# Patient Record
Sex: Female | Born: 1935
Health system: Southern US, Community
[De-identification: ages and names within clinical notes are randomized; demographics above are authoritative.]

## PROBLEM LIST (undated history)

## (undated) DIAGNOSIS — I4891 Unspecified atrial fibrillation: Secondary | ICD-10-CM

## (undated) DIAGNOSIS — J189 Pneumonia, unspecified organism: Secondary | ICD-10-CM

## (undated) DIAGNOSIS — K631 Perforation of intestine (nontraumatic): Secondary | ICD-10-CM

## (undated) DIAGNOSIS — R079 Chest pain, unspecified: Secondary | ICD-10-CM

## (undated) DIAGNOSIS — K5720 Diverticulitis of large intestine with perforation and abscess without bleeding: Secondary | ICD-10-CM

## (undated) DIAGNOSIS — E871 Hypo-osmolality and hyponatremia: Secondary | ICD-10-CM

## (undated) DIAGNOSIS — R042 Hemoptysis: Secondary | ICD-10-CM

## (undated) DIAGNOSIS — S72001A Fracture of unspecified part of neck of right femur, initial encounter for closed fracture: Secondary | ICD-10-CM

## (undated) DIAGNOSIS — K56609 Unspecified intestinal obstruction, unspecified as to partial versus complete obstruction: Secondary | ICD-10-CM

## (undated) DIAGNOSIS — E876 Hypokalemia: Secondary | ICD-10-CM

## (undated) DIAGNOSIS — R1013 Epigastric pain: Secondary | ICD-10-CM

## (undated) DIAGNOSIS — R350 Frequency of micturition: Secondary | ICD-10-CM

## (undated) DIAGNOSIS — L84 Corns and callosities: Secondary | ICD-10-CM

## (undated) DIAGNOSIS — E039 Hypothyroidism, unspecified: Secondary | ICD-10-CM

## (undated) DIAGNOSIS — F329 Major depressive disorder, single episode, unspecified: Secondary | ICD-10-CM

## (undated) DIAGNOSIS — I10 Essential (primary) hypertension: Secondary | ICD-10-CM

## (undated) DIAGNOSIS — K219 Gastro-esophageal reflux disease without esophagitis: Secondary | ICD-10-CM

## (undated) DIAGNOSIS — S72009A Fracture of unspecified part of neck of unspecified femur, initial encounter for closed fracture: Secondary | ICD-10-CM

## (undated) DIAGNOSIS — R001 Bradycardia, unspecified: Secondary | ICD-10-CM

## (undated) DIAGNOSIS — I498 Other specified cardiac arrhythmias: Secondary | ICD-10-CM

## (undated) DIAGNOSIS — E860 Dehydration: Secondary | ICD-10-CM

## (undated) DIAGNOSIS — E43 Unspecified severe protein-calorie malnutrition: Secondary | ICD-10-CM

## (undated) HISTORY — DX: Unspecified atrial fibrillation: I48.91

## (undated) HISTORY — DX: Bradycardia, unspecified: R00.1

## (undated) HISTORY — DX: Hypokalemia: E87.6

## (undated) HISTORY — DX: Frequency of micturition: R35.0

## (undated) HISTORY — DX: Epigastric pain: R10.13

## (undated) HISTORY — DX: Hemoptysis: R04.2

## (undated) HISTORY — DX: Diverticulitis of large intestine with perforation and abscess without bleeding: K57.20

## (undated) HISTORY — DX: Corns and callosities: L84

## (undated) HISTORY — PX: OTHER SURGICAL HISTORY: SHX169

## (undated) HISTORY — DX: Essential (primary) hypertension: I10

---
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
## 1998-11-04 ENCOUNTER — Encounter: Admission: RE | Admit: 1998-11-04 | Discharge: 1998-11-04 | Payer: Self-pay | Admitting: *Deleted

## 1998-11-04 ENCOUNTER — Encounter: Payer: Self-pay | Admitting: *Deleted

## 2005-07-26 ENCOUNTER — Inpatient Hospital Stay (HOSPITAL_COMMUNITY): Admission: EM | Admit: 2005-07-26 | Discharge: 2005-07-30 | Payer: Self-pay | Admitting: Emergency Medicine

## 2005-07-26 ENCOUNTER — Ambulatory Visit: Payer: Self-pay | Admitting: Internal Medicine

## 2005-07-27 ENCOUNTER — Encounter (INDEPENDENT_AMBULATORY_CARE_PROVIDER_SITE_OTHER): Payer: Self-pay | Admitting: Cardiology

## 2005-08-02 ENCOUNTER — Ambulatory Visit: Payer: Self-pay | Admitting: *Deleted

## 2005-08-02 ENCOUNTER — Ambulatory Visit: Payer: Self-pay | Admitting: Internal Medicine

## 2005-08-08 ENCOUNTER — Ambulatory Visit: Payer: Self-pay | Admitting: Cardiovascular Disease

## 2005-08-15 ENCOUNTER — Ambulatory Visit: Payer: Self-pay | Admitting: Internal Medicine

## 2005-08-22 ENCOUNTER — Ambulatory Visit: Payer: Self-pay | Admitting: Cardiology

## 2005-08-25 ENCOUNTER — Ambulatory Visit: Payer: Self-pay | Admitting: Internal Medicine

## 2005-08-29 ENCOUNTER — Ambulatory Visit: Payer: Self-pay | Admitting: Internal Medicine

## 2005-09-08 ENCOUNTER — Ambulatory Visit: Payer: Self-pay | Admitting: Cardiology

## 2005-09-16 ENCOUNTER — Ambulatory Visit: Payer: Self-pay | Admitting: Cardiology

## 2005-09-26 ENCOUNTER — Ambulatory Visit: Payer: Self-pay | Admitting: Cardiology

## 2005-10-10 ENCOUNTER — Ambulatory Visit: Payer: Self-pay | Admitting: Cardiology

## 2005-11-04 ENCOUNTER — Ambulatory Visit: Payer: Self-pay | Admitting: Internal Medicine

## 2005-11-24 ENCOUNTER — Ambulatory Visit: Payer: Self-pay | Admitting: Internal Medicine

## 2005-11-28 ENCOUNTER — Ambulatory Visit: Payer: Self-pay | Admitting: Cardiology

## 2005-12-05 ENCOUNTER — Ambulatory Visit: Payer: Self-pay | Admitting: Cardiology

## 2005-12-28 ENCOUNTER — Ambulatory Visit: Payer: Self-pay | Admitting: *Deleted

## 2006-01-12 ENCOUNTER — Ambulatory Visit: Payer: Self-pay | Admitting: Cardiology

## 2006-01-27 ENCOUNTER — Ambulatory Visit: Payer: Self-pay | Admitting: Cardiovascular Disease

## 2006-02-10 ENCOUNTER — Ambulatory Visit: Payer: Self-pay | Admitting: Internal Medicine

## 2006-02-20 ENCOUNTER — Ambulatory Visit: Payer: Self-pay | Admitting: Cardiology

## 2006-03-06 ENCOUNTER — Ambulatory Visit: Payer: Self-pay | Admitting: Cardiology

## 2006-03-20 ENCOUNTER — Ambulatory Visit: Payer: Self-pay | Admitting: Cardiology

## 2006-03-21 ENCOUNTER — Ambulatory Visit: Payer: Self-pay | Admitting: Cardiovascular Disease

## 2006-03-22 ENCOUNTER — Ambulatory Visit: Payer: Self-pay

## 2006-03-22 ENCOUNTER — Ambulatory Visit: Payer: Self-pay | Admitting: Internal Medicine

## 2006-03-29 ENCOUNTER — Ambulatory Visit: Payer: Self-pay | Admitting: Cardiovascular Disease

## 2006-04-13 ENCOUNTER — Ambulatory Visit: Payer: Self-pay | Admitting: Cardiology

## 2006-05-11 ENCOUNTER — Ambulatory Visit: Payer: Self-pay | Admitting: Cardiovascular Disease

## 2006-05-18 ENCOUNTER — Ambulatory Visit: Payer: Self-pay | Admitting: Internal Medicine

## 2006-06-06 ENCOUNTER — Ambulatory Visit: Payer: Self-pay | Admitting: Cardiology

## 2006-06-30 ENCOUNTER — Ambulatory Visit: Payer: Self-pay | Admitting: Cardiovascular Disease

## 2006-07-28 ENCOUNTER — Ambulatory Visit: Payer: Self-pay | Admitting: Internal Medicine

## 2006-08-25 ENCOUNTER — Ambulatory Visit: Payer: Self-pay | Admitting: Cardiology

## 2006-09-22 ENCOUNTER — Ambulatory Visit: Payer: Self-pay | Admitting: Cardiology

## 2006-09-22 ENCOUNTER — Ambulatory Visit: Payer: Self-pay | Admitting: Internal Medicine

## 2006-09-22 LAB — CONVERTED CEMR LAB
Basophils Relative: 1 % (ref 0.0–1.0)
Creatinine, Ser: 0.6 mg/dL (ref 0.4–1.2)
Eosinophils Relative: 3.3 % (ref 0.0–5.0)
GFR calc non Af Amer: 101 mL/min
Glucose, Bld: 85 mg/dL (ref 70–99)
Hemoglobin: 13.1 g/dL (ref 12.0–15.0)
INR: 2.4 — ABNORMAL HIGH (ref 0.8–1.0)
MCV: 91.9 fL (ref 78.0–100.0)
Monocytes Absolute: 0.5 10*3/uL (ref 0.2–0.7)
Monocytes Relative: 7.8 % (ref 3.0–11.0)
Neutro Abs: 4.5 10*3/uL (ref 1.4–7.7)
Platelets: 332 10*3/uL (ref 150–400)
Prothrombin Time: 19.3 s — ABNORMAL HIGH (ref 10.9–13.3)
RBC: 4.21 M/uL (ref 3.87–5.11)
WBC: 6.9 10*3/uL (ref 4.5–10.5)
aPTT: 35.1 s — ABNORMAL HIGH (ref 21.7–29.8)

## 2006-10-20 ENCOUNTER — Ambulatory Visit: Payer: Self-pay | Admitting: Cardiology

## 2006-11-17 ENCOUNTER — Ambulatory Visit: Payer: Self-pay | Admitting: Cardiology

## 2006-11-27 ENCOUNTER — Encounter: Admission: RE | Admit: 2006-11-27 | Discharge: 2006-11-27 | Payer: Self-pay | Admitting: Gastroenterology

## 2006-12-15 ENCOUNTER — Ambulatory Visit: Payer: Self-pay | Admitting: Cardiology

## 2006-12-28 ENCOUNTER — Ambulatory Visit: Payer: Self-pay | Admitting: Cardiology

## 2007-01-15 ENCOUNTER — Ambulatory Visit: Payer: Self-pay | Admitting: Cardiology

## 2007-02-12 ENCOUNTER — Ambulatory Visit: Payer: Self-pay | Admitting: Internal Medicine

## 2007-03-16 ENCOUNTER — Ambulatory Visit: Payer: Self-pay | Admitting: Cardiology

## 2007-04-05 ENCOUNTER — Ambulatory Visit: Payer: Self-pay | Admitting: Internal Medicine

## 2007-05-03 ENCOUNTER — Ambulatory Visit: Payer: Self-pay | Admitting: Internal Medicine

## 2007-06-05 ENCOUNTER — Ambulatory Visit: Payer: Self-pay | Admitting: Cardiology

## 2007-06-20 ENCOUNTER — Ambulatory Visit: Payer: Self-pay | Admitting: Cardiology

## 2007-06-28 ENCOUNTER — Ambulatory Visit: Payer: Self-pay | Admitting: Internal Medicine

## 2007-07-24 ENCOUNTER — Ambulatory Visit: Payer: Self-pay | Admitting: Internal Medicine

## 2007-07-25 ENCOUNTER — Ambulatory Visit: Payer: Self-pay | Admitting: Cardiology

## 2007-08-06 ENCOUNTER — Ambulatory Visit: Payer: Self-pay | Admitting: Cardiovascular Disease

## 2007-08-29 ENCOUNTER — Ambulatory Visit: Payer: Self-pay | Admitting: Cardiology

## 2007-09-19 ENCOUNTER — Ambulatory Visit: Payer: Self-pay | Admitting: Internal Medicine

## 2007-10-19 ENCOUNTER — Ambulatory Visit: Payer: Self-pay | Admitting: Cardiology

## 2007-11-16 ENCOUNTER — Ambulatory Visit: Payer: Self-pay | Admitting: Cardiology

## 2007-12-21 ENCOUNTER — Ambulatory Visit: Payer: Self-pay | Admitting: Cardiovascular Disease

## 2008-01-08 ENCOUNTER — Ambulatory Visit: Payer: Self-pay | Admitting: Cardiovascular Disease

## 2008-02-05 ENCOUNTER — Ambulatory Visit: Payer: Self-pay | Admitting: Cardiology

## 2008-04-19 DIAGNOSIS — I498 Other specified cardiac arrhythmias: Secondary | ICD-10-CM

## 2008-04-19 DIAGNOSIS — I10 Essential (primary) hypertension: Secondary | ICD-10-CM

## 2008-04-19 DIAGNOSIS — I4891 Unspecified atrial fibrillation: Secondary | ICD-10-CM | POA: Insufficient documentation

## 2008-04-19 HISTORY — DX: Other specified cardiac arrhythmias: I49.8

## 2008-04-19 HISTORY — DX: Essential (primary) hypertension: I10

## 2008-04-22 ENCOUNTER — Encounter: Payer: Self-pay | Admitting: Internal Medicine

## 2008-04-22 ENCOUNTER — Ambulatory Visit: Payer: Self-pay | Admitting: Internal Medicine

## 2008-04-22 DIAGNOSIS — R079 Chest pain, unspecified: Secondary | ICD-10-CM | POA: Insufficient documentation

## 2008-04-22 HISTORY — DX: Chest pain, unspecified: R07.9

## 2008-06-11 ENCOUNTER — Encounter: Payer: Self-pay | Admitting: *Deleted

## 2008-07-16 ENCOUNTER — Encounter: Payer: Self-pay | Admitting: *Deleted

## 2008-07-22 ENCOUNTER — Telehealth: Payer: Self-pay | Admitting: Internal Medicine

## 2009-04-17 ENCOUNTER — Ambulatory Visit: Payer: Self-pay | Admitting: Internal Medicine

## 2010-02-09 NOTE — Assessment & Plan Note (Signed)
Summary: 1 year return   Visit Type:  Follow-up- 1 year  CC:  No cardiac complaints..  History of Present Illness: [name] is seen in followup for atrial fibrillation atrial tachycardia and bradycardia. She also has hypertension.She has had no complaints of arrhythmias.The patient denies SOB, chest pain, edema or palpitations She is anticipating a return trip to [place] here  Current Medications (verified): 1)  Lisinopril-Hydrochlorothiazide 20-12.5 Mg Tabs (Lisinopril-Hydrochlorothiazide) .... Take Two Tabs By Mouth Once Daily 2)  Coumadin 2 Mg Tabs (Warfarin Sodium) .... As Directed 3)  Trazodone Hcl 50 Mg Tabs (Trazodone Hcl) .... At Bedtime 4)  Fish Oil 1000 Mg Caps (Omega-3 Fatty Acids) .... Take Two Tab By Mouth Once Daily 5)  Pindolol 5 Mg Tabs (Pindolol) .... 1/2 Tab Twice   Daily 6)  Amlodipine Besylate 5 Mg Tabs (Amlodipine Besylate) .Marland Kitchen.. 1 Tab Once Daily 7)  Nitrostat 0.4 Mg Subl (Nitroglycerin) .... As Needed 8)  Zantac 150 Mg Tabs (Ranitidine Hcl) .... Take One Tab By Mouth Two Times A Day 9)  Multivitamins   Tabs (Multiple Vitamin) .... Take One Tab By Mouth Once Daily 10)  Vitamin D 400 Unit  Tabs (Cholecalciferol) .... Take One Tab By Mouth Once Daily 11)  Vitamin B-12 500 Mcg  Tabs (Cyanocobalamin) .... Take One Tab By Mouth Every Other Day 12)  Mag-Ox 400 400 Mg Tabs (Magnesium Oxide) .... Take One Tab By Mouth Once Daily 13)  Calcium Carbonate-Vitamin D 600-400 Mg-Unit  Tabs (Calcium Carbonate-Vitamin D) .... Take Two Tabs By Mouth Once Daily  Allergies (verified): No Known Drug Allergies  Past History:  Past Medical History: Last updated: 04/19/2008 BRADYCARDIA (ICD-427.89) ALOPECIA (ICD-704.00) HYPERTENSION, UNSPECIFIED (ICD-401.9) ATRIAL FIBRILLATION (ICD-427.31)    Past Surgical History: Last updated: 04/19/2008 hysterectomy  Family History: Last updated: 04/19/2008 Family History of Cancer:  Family History of Coronary Artery Disease:   Social  History: Last updated: 04/19/2008 Retired  Married  Tobacco Use - Former. at age 75 Alcohol Use - yes occasional wine Drug Use - no  Vital Signs:  Patient profile:   77 year old female Height:      62 inches Weight:      129 pounds Pulse rate:   50 / minute Pulse rhythm:   regular BP sitting:   138 / 82  (left arm) Cuff size:   regular  Vitals Entered By: Judithe Modest CMA (April 17, 2009 9:49 AM)  Physical Exam  General:  Well developed, well nourished, in no acute distress. Head:  normal HEENT Neck:  supple with flat neck veins Lungs:  clear Heart:  regular rate and rhythm without murmurs or gallops Abdomen:  soft nontender with active bowel sounds Extremities:  without clubbing cyanosis or edema Neurologic:  alert and oriented with decreased hearing Skin:  warm and dry   EKG  Procedure date:  04/17/2009  Findings:      sinus rhythm at 50 Intervals 0.22/0.09/0.47 Axis is 35 Minor first degree AV block frequent PAC  Impression & Recommendations:  Problem # 1:  ALOPECIA (ICD-704.00) Alopecia resolved with a change in medications  Problem # 2:  HYPERTENSION, UNSPECIFIED (ICD-401.9)  well-controlled on current medicine Her updated medication list for this problem includes:    Lisinopril-hydrochlorothiazide 20-12.5 Mg Tabs (Lisinopril-hydrochlorothiazide) .Marland Kitchen... Take two tabs by mouth once daily    Pindolol 5 Mg Tabs (Pindolol) .Marland Kitchen... 1/2 tab twice   daily    Amlodipine Besylate 5 Mg Tabs (Amlodipine besylate) .Marland Kitchen... 1 tab once daily  Orders: EKG w/ Interpretation (93000)  Problem # 3:  ATRIAL FIBRILLATION (ICD-427.31) no recurrent atrial fibrillation; still on Coumadin  Orders: EKG w/ Interpretation (93000)  Problem # 4:  BRADYCARDIA (ICD-427.89) asymptomatic  Orders: EKG w/ Interpretation (93000)  Patient Instructions: 1)  Your physician wants you to follow-up in: 1 year.  You will receive a reminder letter in the mail two months in advance. If  you don't receive a letter, please call our office to schedule the follow-up appointment.

## 2010-05-25 NOTE — Assessment & Plan Note (Signed)
[facility]                         ELECTROPHYSIOLOGY OFFICE NOTE   NADEAN, MONTANARO                         MRN:          [id number]  DATE:07/24/2007                            DOB:          1920/07/18    Mrs. Gunkel is seen in followup for atrial fibrillation.  When we saw  her a month ago, she was complaining of alopecia and I thought may be it  was related to her pindolol.  We elected at that point to discontinue  this pindolol and she thinks her alopecia may be somewhat better.   OTHER MEDICATIONS CURRENTLY:  1. Lisinopril HCT 20/25.  2. Trazodone.  3. Magnesium.   PHYSICAL EXAMINATION:  VITAL SIGNS:  Her blood pressure remains mildly  elevated at 158/86 and her pulse is 58.  LUNGS:  Clear.  HEART:  Sounds were regular.  EXTREMITIES:  Without edema.   IMPRESSION:  1. Paroxysmal atrial fibrillation.  2. Hypertension.  3. Relative bradycardia.  4. Alopecia.  5. Thromboembolic risk factors including hypertension, age, on      Coumadin.   Mrs. Mortellaro is doing I think better off the pindolol.  We will plan to  give her about 8 more weeks to see if she can tell whether the alopecia  is resolving.   We also chose to defer at this point Augmentin, antihypertensive therapy  so as to minimize the likelihood of confusion between the side effects  and drugs.   We will make a decision in about 8 weeks.     Duke Salvia, MD, [facility]  Electronically Signed    SCK/MedQ  DD: 07/24/2007  DT: 07/25/2007  Job #: [id number]   cc:   C. Duane Lope, M.D.

## 2010-05-25 NOTE — Assessment & Plan Note (Signed)
[facility]                         ELECTROPHYSIOLOGY OFFICE NOTE   Candace Cain, Candace Cain                         MRN:          [id number]  DATE:09/19/2007                            DOB:          May 12, 1920    Candace Cain is seen in followup for alopecia in the setting of atrial  fibrillation for which she was taking pindolol pill.  We discontinued  pindolol about 3 months ago.  She has had now recurrent problems.  As  the beta-blocker has been withdrawn, she has noted her blood pressure  has been a little bit higher in the 145/85-90 range at home.  She is  concerned about her blood pressure.  She is feeling no tachy  palpitations at this point.   She does take Coumadin for her paroxysmal atrial arrhythmias and  lisinopril 20/12.5.   PHYSICAL EXAMINATION:  On examination today, her blood pressure was  153/78.  Her pulse was 57.  Her lungs were clear.  Heart sounds were  regular. Extremities were without edema.  The skin was warm and dry.   IMPRESSION:  1. Hypertension.  2. Paroxysmal atrial fibrillation.  3. Alopecia not seemingly related to pindolol as it did not improve      with discontinuation.   She is to follow up with Candace Cain in a couple of weeks.  At that time,  the rest of her medications list can be reviewed to see if there are any  contributors to alopecia.   I have given her a prescription for Norvasc 2.5 mg to take once a day,  beginning about 2 weeks before she sees Candace Cain.  That will give him a  chance to see:  a.  Whether it is effective.  b. Whether there are any associated side effects.     Candace Salvia, MD, [facility]  Electronically Signed    SCK/MedQ  DD: 09/19/2007  DT: 09/20/2007  Job #: [id number]   cc:   Candace Cain, M.D.

## 2010-05-25 NOTE — Assessment & Plan Note (Signed)
[facility]                         ELECTROPHYSIOLOGY OFFICE NOTE   Candace Cain, Candace Cain                         MRN:          [id number]  DATE:06/28/2007                            DOB:          April 03, 1920    Candace Cain comes in with her husband today, feeling terrific.  They  would like to stop the Coumadin.  She also has complained of a little  bit of alopecia.   MEDICATIONS:  1. Pindolol 5 mg twice a day.  2. Trazodone.  3. Lisinopril 20/25.  4. Coumadin.  5. Prilosec.  6. Fish oil.   PHYSICAL EXAMINATION:  VITAL SIGNS:  Her blood pressure was 120/64, her  pulse was 56, and her weight was 131.  LUNGS:  Clear.  HEART:  Her heart sounds were regular without murmurs or gallops.  EXTREMITIES:  Without edema.   Today, electrocardiogram demonstrated sinus rhythm with PACs, with  intervals of 0.19/0.09/0.44.  The axis was 20 degrees.   IMPRESSION:  1. Paroxysmal atrial fibrillation.  2. Thromboembolic risk factors notable for:      a.     Hypertension.      b.     Age.  3. Relative bradycardia.   PLAN:  Candace Cain would like to come off her Coumadin.  We have had a  lengthy discussion about her CHAS score, thromboembolic risk potential,  and it was my recommendation that she maintain her Coumadin.  After  these discussions, the family was agreeable.   As related to her alopecia, we decided to stop her pindolol to see if  that helped.  We planned to stop that for a month.  She will let us  know, and then we will make a decision at that time as to what to do  about further medications.    Duke Salvia, MD, [facility]  Electronically Signed   SCK/MedQ  DD: 06/29/2007  DT: 06/30/2007  Job #: [id number]   cc:   C. Duane Lope, M.D.

## 2010-05-25 NOTE — Assessment & Plan Note (Signed)
[facility]                         ELECTROPHYSIOLOGY OFFICE NOTE   ARIBELLE, MCCOSH                         MRN:          [id number]  DATE:09/22/2006                            DOB:          Mar 28, 1920    Ms. Sauber comes in today.  She was awakened the other night with  severe chest pain.  She took two nitroglycerin with resolution.  Her  husband took her blood pressure  after that.  It was 83.   She has a history of atrial arrhythmias and bradycardia.  She has been  managed with Pindolol.  She is concerned about her nocturnal bradycardia  and so we stopped her nocturnal Pindolol the other day.   As it relates to her chest pain, she had a Myoview scan a number of  months ago that was negative; however, she lives in dread fear of this  chest pain.  She is concerned that she is going to die and that it is  related to her heart.   Her other medications include Pindolol, Coumadin, lisinopril/HCT 20/25  mg.   On examination her blood pressure today was 115/80, her pulse was 51.  LUNGS:  Clear.  Her heart sounds were regular with a widely split S1 and S2.  ABDOMEN:  Soft with active bowel sounds without midline pulsation or  epigastric discomfort.  EXTREMITIES:  No edema.   Electrocardiogram dated today demonstrated sinus rhythm at 52 with  intervals of 0.21/0.08/0.42.  There was an isolated PAC.   IMPRESSION:  1. Chest pain concerning the family for angina with a negative Myoview      scan.  2. Hypertension.  3. Atrial arrhythmias/fibrillation, on Coumadin.  4. Propensity toward bradycardia on Pindolol with increasing nocturnal      bradycardia.   Ms. Barsanti and her husband are both quite concerned that this chest  pain is cardiac in origin.  I suspect that that is not true.  We  discussed the potential benefits of catheterization for certainty given  her negative Myoview scan previously.  They would like to pursue this.  We have  discussed potential benefits as well as potential risks  including but not limited to stroke, heart attack and dying.  They  understand.   We will also plan to put her on a PPI and I have asked them not to give  her any more nitroglycerin given the hypotension response until we have  greater clarity as to whether she has coronary artery disease or not.     Duke Salvia, MD, [facility]  Electronically Signed    SCK/MedQ  DD: 09/22/2006  DT: 09/23/2006  Job #: [id number]

## 2010-05-25 NOTE — Assessment & Plan Note (Signed)
[facility]                         ELECTROPHYSIOLOGY OFFICE NOTE   Candace Cain, Candace Cain                         MRN:          [id number]  DATE:05/18/2006                            DOB:          [birth year]    Candace Cain comes in today with her husband.  She has atrial arrhythmias  and some bradycardia, but she has no complaints.  She has no chest pain  or shortness of breath.  She was actually doing very well and worked in  her flower garden for 4 hours the other day.   MEDICATIONS:  1. Lisinopril 20/12.5.  2. Pindolol 2.5 b.i.d.  3. Coumadin.  4. Vitamins.   PHYSICAL EXAMINATION:  VITAL SIGNS:  Her blood pressure is 136/84, pulse  of 51.  LUNGS:  Clear.  HEART:  Heart sounds were regular.  EXTREMITIES:  Without edema.   IMPRESSION:  1. Paroxysmal atrial arrhythmias including fibrillation and slow      tachycardia.  2. Modest bradycardia.  3. Systolic hypertension.   Candace Cain is stable.  Will see her again in 6 months.     Duke Salvia, MD, [facility]  Electronically Signed    SCK/MedQ  DD: 05/18/2006  DT: 05/18/2006  Job #: [id number]   cc:   C. Duane Lope, M.D.

## 2010-05-28 NOTE — Discharge Summary (Signed)
NAMEVIRGINIE, Candace Cain NO.:  [id number]   MEDICAL RECORD NO.:  [id number]          PATIENT TYPE:  INP   LOCATION:  2030                         FACILITY:  [facility]   PHYSICIAN:  Francisca December, M.D.  DATE OF BIRTH:  12/18/1920   DATE OF ADMISSION:  07/26/2005  DATE OF DISCHARGE:  07/30/2005                                 DISCHARGE SUMMARY   ADMISSION DIAGNOSIS:  Palpitations.   DISCHARGE DIAGNOSES:  1. Palpitations secondary to sinus tachycardia and paroxysmal      supraventricular tachycardia was intermittent atrial fibrillation.  2. Tachybrady syndrome with episodes of atrial tachycardia, then with      episodes of sinus bradycardia.  3. Prolonged QTC secondary to flecainide.  4. Systemic anticoagulation on Coumadin therapy.  5. Hypertension, controlled.  6. Pleuritic chest pain, resolved.  7. Dyspnea, resolved.  8. Status post total abdominal hysterectomy with  bilateral salpingo-      oophorectomy.  9. Hypokalemia, repleted.  10.Pulsatile abdominal mass negative for abdominal aortic aneurysm via      abdominal ultrasound.  11.Normal left ventricular systolic function was ejection fraction 55-65%      via 2-D echocardiogram July 27, 2005.  12.Mild mitral valvular regurgitation.  13.Normal stress Cardiolite without inducible ischemia with ejection      fraction of 69% July 28, 2005.  14.Chronic obstructive pulmonary disease.  15.Remote history of tobacco abuse with cessation at age 48.   PROCEDURES:  1. Abdominal ultrasound July 27, 2005.  2. Transthoracic 2-D echocardiogram July 27, 2005.  3. Stress Cardiolite test August 07, 2005.   HOSPITAL COURSE:  Candace Cain is an 77 year old female with a history of  hypertension who was admitted to the [facility] for new  onset palpitations that were characterized on telemetry by a sinus  bradycardia alternating with normal sinus rhythm, then alternating with  atrial fibrillation.  The  patient was placed on subcutaneous Lovenox  secondary to the atrial arrhythmia; however, she was not placed on IV  Cardizem or beta-blocker secondary to her bradycardia.  A 2-D echocardiogram  was obtained.  Normal LV systolic function with an EF of 55-65% with no  regional wall motion abnormalities.  There was mild mitral valvular  regurgitation.  D-dimer and TSH as well as BNP were normal.  The patient's  Verapamil was placed on hold secondary to bradycardia, however, her ACE  inhibitor was continued.  A cardiology consult was obtained.  A 12-lead EKG  was obtained and revealed sinus rhythm with occasional PAC with a  ventricular rate of 62 beats per minute with no ischemic changes.  Point of  care enzymes were obtained and were negative x2.  Serial cardiac enzymes  were obtained x3 with a peak troponin of 0.02.  A pulsatile abdominal mass  was felt and was ruled out for AAA via abdominal ultrasound.  The patient  was started on atenolol with blood pressure and heart rate parameters and  was monitored for tachybrady syndrome.  During the night, the patient had  episodes of sinus bradycardia without pauses, mainly while she  was asleep.  During the following day, the beta blocker was discontinued.  The patient's  complaints of chest pain had resolved and MI had been ruled out with  negative enzymes as well as a negative EKG.  The patient was scheduled for a  stress Cardiolite which revealed no evidence of stress-induced ischemia with  mild fixed thinning at the apex with a EF of 69%.  The patient continued to  have atrial tachycardia with episodes of sinus bradycardia.  As a result,  she was started on digoxin 0.25 mg daily as well as flecainide 50 mg daily  with a repeat EKG the following day.  The EKG revealed sinus bradycardia  with intermittent sinus tachycardia and PSVT with a prolonged QTC of 521  milliseconds.  The patient was having tachybrady syndrome with episodes of  increased  heart rate up to 105 beats per minute that was consistent with  atrial tachycardia then having episodes of bradycardia with pauses.  The QTC  was prolonged secondary to flecainide, so the flecainide was placed on hold  as well as the digoxin.  An EP consult was obtained.  Options of a beta  blocker, flecainide therapy, Verapamil or permanent pacemaker if the  previous therapies failed were discussed with the patient and her husband.  The patient was then started on Coumadin.  Her lisinopril was continued.  Pindolol was started.  Other medications were then discontinued and then the  patient was scheduled for an outpatient Holter monitor for 72 hours or more  to assess bradycardia.  During this admission, the patient became  hypokalemic and it was repleted with K-Dur 40 mEq.  The patient was  discharged to home on July 30, 2005, in stable condition and was followed up  in Dr. Odessa Fleming office to be fitted for the outpatient Holter monitor for  assessment of bradycardia.   LABORATORY DATA:  White blood count 5.4, hemoglobin 13.5, hematocrit 40.6,  platelets 314.  Sodium 135, potassium 3.3 which was repleted prior to her  discharge, chloride 103, CO2 26, glucose 105, BUN 13, creatinine 0.7,  calcium 9.  PT 12.7, INR 0.9.  D-dimer 0.22.  BNP 142.  TSH 2.128, T4 7.  Point of care cardiac markers:  Myoglobin 68.4 and 56.9, respectively; CK-MB  2 and 1.6, respectively; troponin I less than 0.05 x2.  Serial cardiac  markers x3: CK total 112, 92, and 89, respectively; CK-MB 2.4, 2.2, and 2,  respectively; troponin I 0.02, less than 0.01, and 0.02, respectively.  UA  negative.   EKG:  1. EKG July 26, 2005, as stated in the HPI.  2. EKG July 26, 2005, sinus bradycardia with paroxysmal atrial tachycardia      with a ventricular rate of 61 beats per minute.  No ischemic changes.      This is new compared with the previous EKG.  3. EKG July 28, 2005, sinus rhythm with paroxysmal atrial tachycardia  with     a ventricular rate of 72 beats per minute.  No ischemic changes.  4. EKG July 30, 2005, marked sinus bradycardia with occasional PACs with a      ventricular rate of 49 beats per minute.  No ischemic changes.   X-RAYS:  Chest x-ray July 28, 2005, COPD.  No acute findings.   CONDITION ON DISCHARGE:  Candace Cain was discharged to home on July 30, 2005, in stable condition without complaint of chest pain, palpitations,  shortness of breath, or dizziness (lightheadedness).  She  was discharged to  home with the plan to follow up with Dr. Anabel Halon to wear an outpatient  Holter monitor for 72+ hours for further evaluation of bradycardia.   DISCHARGE MEDICATIONS:  1. Pindolol 0.5 mg twice daily.  2. Coumadin 4 mg take as directed.  3. Lisinopril/hydrochlorothiazide 20/25 mg daily as prior to admission.  4. Ogen 1.25 mg daily as prior to admission.   DISCHARGE INSTRUCTIONS:  1. The patient was encouraged to continue to follow a heart-healthy diet      including low-salt.  2. The patient was instructed to call [phone] if questions were to arise      over the weekend.   FOLLOW-UP ARRANGEMENTS:  The patient was scheduled to follow up with Dr.  Berton Mount on August 01, 2005.  Dr. Odessa Fleming office was scheduled to call the  patient with an appointment time.  Dr. Odessa Fleming the office is [phone].  The  patient was scheduled to call that number if she was unable to make the  scheduled appointment.      Tylene Fantasia, [place]      Francisca December, M.D.  Electronically Signed    RDM/MEDQ  D:  10/05/2005  T:  10/07/2005  Job:  [id number]   cc:   C. Duane Lope, M.D.  Duke Salvia, MD, [facility]

## 2010-05-28 NOTE — Consult Note (Signed)
NAMEMARYCLARE, NYDAM NO.:  [id number]   MEDICAL RECORD NO.:  [id number]          PATIENT TYPE:  INP   LOCATION:  2030                         FACILITY:  [facility]   PHYSICIAN:  Francisca December, M.D.  DATE OF BIRTH:  02/22/1920   DATE OF CONSULTATION:  07/26/2005  DATE OF DISCHARGE:                                   CONSULTATION   REASON FOR CONSULTATION:  Palpitation.   HISTORY OF PRESENT ILLNESS:  Candace Cain is a pleasant 77 year old without  known prior cardiac disease except for mitral valve prolapse.  She had  been complaining of dyspnea for the past week and this has been associated  with palpitation over the last 3-4 days.  She did not have chest discomfort  until last night which lasted a few minutes.  It was an isolated event in  general.  Today, she took her own blood pressure and it ranged in the  systolic from 120 to 140 with a heart rate of 120.  This was associated with  some wooziness.  The patient states her heart rate is typically in the  50s.  EMS was called and she was transported to [facility]  Emergency Room.  There, she was found to be in normal sinus rhythm with  frequent PACs.  A 12-lead ECG did show the onset of a tachycardia of  approximate 100 toward the of the recording.  It did appear regular.   PAST HISTORY:  1.  Hypertension.  2.  Remote history of hysterectomy.   SOCIAL HISTORY:  Quit smoking 13 years ago.  Occasional wine.  No illicit  drugs.  She is married, accompanied by her husband here today in the  hospital.   FAMILY HISTORY:  Father died of coronary artery disease.  Mother has died,  had osteoporosis severe. One brother, 27, has had a myocardial infarction  and hypertension and he has passed away.   ALLERGIES:  None known.   CURRENT MEDICATIONS:  1.  Lisinopril/HCT 20/25 one p.o. daily.  2.  Ogen 1.25 mg daily.  3.  Verapamil 120 mg p.o. daily.  4.  Lovenox 1 mg/kg q.12h. has been begun here in the  hospital.   REVIEW OF SYSTEMS:  Negative otherwise.   PHYSICAL EXAMINATION:  VITAL SIGNS:  Blood pressure 135/88, pulse is 60 and regular.  While was  auscultating, the patient did go in and out of the tachycardia briefly,  respiratory rate 16, temperature 97.7, 98% saturation on 2 liters.  GENERAL:  This is a well-appearing 77 year old Caucasian woman who does  appear younger than stated age in no distress.  HEENT:  Unremarkable.  The head is atraumatic, normocephalic.  Pupils are  equal, round, react to light accommodation.  Extraocular motion intact.  The  sclerae are anicteric.  Oral mucosa pink and moist.  Teeth and gums in good  repair.  The tongue is not coated.  NECK:  Supple without thyromegaly or  masses.  The carotid upstrokes are normal.  There is no bruit.  There is no  jugular distension.  CHEST:  Clear  with adequate excursion bilaterally.  No wheezes, rales or  rhonchi.  HEART:  Has a regular rhythm and, in fact, the tachycardia auscultates  regular.  There is no murmur, click, gallop or rub.  ABDOMEN:  Soft and nontender.  There is a palpable pulsatile mass midline at  the umbilicus.  There is a vertical hysterectomy scar.  Bowel sounds present  in all quadrants.  There is no hepatomegaly.  EXTREMITIES:  Lower extremities show no edema.  She has intact distal  pulses.  There are no femoral bruits.  NEUROLOGICAL:  Cranial nerves 2-12 are intact.  Motor and sensory grossly  intact.  Gait not tested.  SKIN:  Warm, dry and clear.   ACCESSORY CLINICAL DATA:  Admission hemogram, serum electrolytes, BUN,  creatinine, glucose, and urinalysis all within normal limits.  Initial CK-MB  and troponin in the emergency room are normal.  TSH, D-dimer and BNP are  pending.  EKG shows sinus bradycardia and no specific findings other than  the PAC initiated tachycardia for 3 or beats.  On telemetry, she has  demonstrated paroxysmal atrial tachycardia.  This is relatively regular at   100 beats per minute and atrial activity is visible prior to each QRS.   ASSESSMENT:  1.  Paroxysmal atrial tachycardia, I doubt atrial fibrillation, possible      atrial flutter although flutter waves are not obvious.  2.  Associated light headedness and dizziness with above, question      tachybrady syndrome.  3.  Episode of chest discomfort.  4.  Hypertension.  5.  Abdominal pulsatile mass, rule out abdominal aortic aneurysm.   PLAN:  1.  Will continue telemetry monitoring, begin metoprolol 25 mg p.o. b.i.d.,      observe for possible tachybrady syndrome.  2.  Check 2-D echocardiogram, D-dimer, and BNP.  3.  Obtain cardiac serial cardiac enzymes.  Consider exercise Cardiolite.  4.  Check abdominal ultrasound.   Thank you very much for allowing me to assist in the care of Candace Cain.  It has been a pleasure to do so.  I will discuss her further care with you.      Francisca December, M.D.  Electronically Signed     JHE/MEDQ  D:  07/26/2005  T:  07/26/2005  Job:  [id number]   cc:   Miguel Aschoff, M.D.  Fax: [phone]

## 2010-05-28 NOTE — H&P (Signed)
NAMETYASIA, PACKARD NO.:  [id number]   MEDICAL RECORD NO.:  [id number]          PATIENT TYPE:  EMS   LOCATION:  MAJO                         FACILITY:  [facility]   PHYSICIAN:  Sherin Quarry, MD      DATE OF BIRTH:  06/06/1920   DATE OF ADMISSION:  07/26/2005  DATE OF DISCHARGE:                                HISTORY & PHYSICAL   Candace Cain is an 77 year old lady, who is generally in excellent health.  She is very physically active going to a fitness center on a daily basis and  walking on a treadmill or using an elliptical machine for approximately 45  to 60 minutes.  Normally this activity does not produce any problems with  shortness of breath; however, yesterday, the patient noted that she had a  feeling of palpitation in her chest while she was using the elliptical  machine.  She rested and then the problem seemed to go away, but later in  the evening and again this morning, she once again felt this sensation of  palpitations.  She also states that she felt dizzy and lightheaded while the  palpitations were occurring.  There was no associated headache, breathing  difficulty or coughing.  Eventually, her husband became concerned about  these symptoms and contacted EMS.  According to EMS report, when they  arrived, her heart rate was approximately 110 and during the transportation  process, they noted episodes irregular heartbeat which they felt were  consistent with atrial fibrillation.  There are no rhythm strips to support  this.  After arrival in the emergency department, her blood pressure was  156/81.  On monitoring of her heart rate, she is noted to have episodes  where her heart rate will be in the range of 45 to 50 with a sinus  mechanism.  Other periods where her heart rate will be about 70 to 80 with a  sinus mechanism and other periods where the heart rate will be 110 to 120  with what appeared to be episodes of atrial fibrillation.  During these  episodes of rapid heartbeat, she will describe palpitations.   Laboratory studies in the emergency room revealed a white count of 5800,  __________   The patient was admitted at this time for evaluation of irregular heartbeat  and associated palpitations symptoms __________   PAST MEDICAL HISTORY:  Current medications consist of Lisinopril/HCTZ 20/25  one daily, Ogen 1.25 mg daily and Verapamil 120 mg daily.   ALLERGIES:  NO KNOWN DRUG ALLERGIES.   OPERATIONS:  She has had a previous TAH/BSO.   MEDICAL ILLNESSES:  Her husband states that she has never been hospitalized  for any medical illness.  The only problem that she has been followed for as  an outpatient has been hypertension.  She states that Dr. Tenny Craw had advised  her that her blood pressure is more regulated.   FAMILY HISTORY:  Significant in that the patient's brother apparently died  of a myocardial infarction which occurred during sleep.  Another brother  also has heart problems and a history of angina.  Another brother died as a  result of prostate cancer.   SOCIAL HISTORY:  The patient smoked until age 11 when she discontinued this  practice.  She has not smoked for the last 13 years.  She does not use  alcohol or drugs.   REVIEW OF SYSTEMS:  HEAD:  She denies headache or dizziness.  EYES:  She  denies visual blurring or diplopia.  EAR/NOSE/THROAT:  Denies earache, sinus  pain or sore throat.  CHEST:  Denies coughing, wheezing or chest congestion.  CARDIOVASCULAR:  See above.  There is no recent history of orthopnea,  paroxysmal nocturnal dyspnea or ankle edema.  There is no history of  exertional chest pain.  GI:  There is no history of melena or hematochezia,  no nausea, vomiting or abdominal pain.  GU:  Denies dysuria or urinary  frequency.  NEURO:  No history of seizure or stroke.  ENDO:  Denies  excessive thirst, urinary frequency or nocturia.   PHYSICAL EXAMINATION:  GENERAL:  The patient is a very pleasant  lady, who is  quite alert and had no specific complaints except for the feeling of  palpitation when her heart beats fast.  HEENT:  Within normal limits.  CHEST:  Clear.  BACK:  No CVA or point tenderness.  CARDIOVASCULAR:  Normal S1 and S2. I did not hear any rubs.  I did not hear  any murmurs or gallops.  The heart rhythm is somewhat irregular again when  the heart rate is about 100 to 120.  ABDOMEN:  Benign.  Normal bowel sounds.  No masses or tenderness.  No  guarding or rebound.  NEUROLOGIC:  Examination of extremities normal.   IMPRESSION:  1.  Hert rhythm disturbance characterized by sinus bradycardia alternating      with normal sinus rhythm alternating with atrial fibrillation.      Apparently of acute onset.  2.  History of hypertension.  3.  Status post hysterectomy.   PLAN:  1.  Will admit the patient to telemetry.  2.  Will place her on subcutaneous Lovenox because of the atrial arrhythmia      may increase the risk of stroke.  It is somewhat problematic how best to      treat this arrhythmia because there is a significant degree of      underlying bradycardia which is going to make the use of IV Cardizem or      a Beta blocker somewhat difficult.  For this reason, I think the most      prudent thing to do is to get a cardiology consult.  I will also obtain      a 2D echo, thyroid study and D-dimer.  I am going to hold the patient's      Verapamil in light of the relative bradycardia and continue her Ace      inhibitor.           ______________________________  Sherin Quarry, MD     SY/MEDQ  D:  07/26/2005  T:  07/26/2005  Job:  [id number]   cc:   C. Duane Lope, M.D.  Fax: [phone]   [facility] Cardiology

## 2010-05-28 NOTE — Assessment & Plan Note (Signed)
[facility]                            CARDIOLOGY OFFICE NOTE   [name], [name]                         MRN:          [id number]  DATE:03/21/2006                            DOB:          May 06, 1920    HISTORY OF PRESENT ILLNESS:  Candace Cain was seen as an outpatient at  the [facility] on March 21, 2006.  She is an 77 year old  woman who is regularly followed by Dr. Graciela Husbands who presents for evaluation  of chest pain.   Candace Cain describes an episode that occurred approximately three days  ago where she developed left sided chest pressure radiating to the jaw.  The episode occurred at rest and lasted for approximately one minute.  She has had no further pain or pressure in her chest.  She denies any  dyspnea.  She is able to feel her heartbeat at times, but this episode  was distinct from her normal sensation of her heartbeat.  She denies  exertional symptoms.  There has been no recent lightheadedness or  syncope.  She does have ongoing palpitations.  She was evaluated  yesterday by her primary physician, Dr. Tenny Craw, who performed an EKG that  demonstrated what appears to be normal sinus rhythm versus an atrial  tachycardia that abruptly changes to marked sinus bradycardia for three  beats followed by a return to her sinus rhythm.  Of note, she has a  history of atrial tachycardia and sinus bradycardia.   CURRENT MEDICATIONS:  1. Pindolol 2.5 mg b.i.d.  2. Coumadin as directed.  3. Lisinopril HCT 20/25 mg daily.  4. Ogen 1.25 mg daily.  5. Multivitamin daily.  6. Vitamin C 500 mg daily.  7. Calcium Plus vitamin D 600 mg b.i.d.  8. Magnesium 400 mg two daily.   ALLERGIES:  NO KNOWN DRUG ALLERGIES.   PHYSICAL EXAMINATION:  GENERAL:  She is an alert and oriented elderly  woman, no acute distress.  VITAL SIGNS:  Weight 128 pounds, blood pressure 144/84, heart rate 59,  respirations 16.  HEENT:  Normal.  NECK:  Normal. Carotid  upstrokes without bruits.  Jugular venous  pressure normal.  LUNGS:  Clear to auscultation bilaterally.  HEART:  Demonstrates a variable rate with a normal rate followed by an  abrupt change to bradycardia and returned to normal rate of  approximately 90.  The apex is discrete and nondisplaced.  There is a  2/6 systolic ejection murmur over the left sternal border.  ABDOMEN:  Soft, nontender, no organomegaly.  EXTREMITIES:  No cyanosis, clubbing or edema.  Peripheral pulses are 2+  and equal throughout.   ASSESSMENT:  Mrs.  Cain is an 77 year old woman with the following  cardiac issues.  1. Chest pain.  She had a single episode of pain, but it certainly is      bothersome that she had left sided chest pressure radiating to the      jaw.  With her age as the main risk factor, I think we should rule      out significant ischemic heart disease and we  will schedule her for      an exercise Myoview stress study.  She was given a prescription for      sublingual nitroglycerin by Dr. Tenny Craw yesterday and the appropriate      use of this was again reviewed with her and her husband.  I also      asked her to begin an 81 mg aspirin until we have the results of      her stress test available.  2. Cardiac arrhythmias.  With the variation in her heart rate and      history of atrial tachycardia as well as significant bradycardia, I      have asked her to undergo a 48 hour Holter monitor.  We will be in      contact with her after the results are available.     Candace Cain. Excell Seltzer, MD  Electronically Signed    MDC/MedQ  DD: 03/21/2006  DT: 03/22/2006  Job #: [id number]   cc:   Duke Salvia, MD, [facility]  C. Duane Lope, M.D.

## 2010-05-28 NOTE — Assessment & Plan Note (Signed)
[facility]                              CARDIOLOGY OFFICE NOTE   Candace Cain, Candace Cain                         MRN:          [id number]  DATE:11/24/2005                            DOB:          [birth year]    REFERRING PHYSICIAN:  C. Duane Lope, M.D.   Mrs.  Cain comes in today with her husband.  She has atrial arrhythmias  and some bradycardia.  She has no complaints at this time.  She has no chest  pain or shortness of breath.   Her blood pressure has been doing pretty well.  It was a little elevated to  her concern today at 132/98, when I took it was 142/84.  The pulse was 80 to  100.   Her other medications in addition to Coumadin include:  1. Atenolol 2.5 b.i.d.  2. Lisinopril.  3. Hydrochlorothiazide 20/25 as well as Coumadin.   On examination her blood pressure was as noted.  Her lungs were clear.  Heart sounds were regular but they were rapid.  Extremities without edema.   Electrocardiogram was quite notable in that it showed an atrial tachycardia  running just under 100 beats per minute, at about 95 with termination of the  tachycardia and ensuing sinus rhythm at about 45 beats per minute.   IMPRESSION:  1. Paroxysmal atrial arrhythmias including fibrillation and a slow      tachycardia.  2. Tendency towards bradycardia.  3. Systolic hypertension.   This woman is stable from arrhythmia symptom point of  view.  I do not plan  to do anything different with her rhythm medications or consideration of  pacing at this time.   We will have her talk to the research foundation about consideration ROCKET  which is a research protocol looking at alternatives to Coumadin.   I will see her again in 6 months time.    Duke Salvia, MD, [facility]  Electronically Signed   SCK/MedQ  DD: 11/24/2005  DT: 11/24/2005  Job #: [id number]   cc:   C. Duane Lope, M.D.

## 2010-05-28 NOTE — Assessment & Plan Note (Signed)
[facility]                            CARDIOLOGY OFFICE NOTE   Candace Cain                         MRN:          [id number]  DATE:03/21/2006                            DOB:          07/01/1920    Candace Cain presents to the [facility] Cardiology office as an outpatient  on March 21, 2006.  She is an 77 year old woman with a history of atrial  arrhythmias and bradycardia who presents for evaluation of chest pain.  She had an episode approximately 3 days ago where she describes left-  sided chest pressure radiating up to the jaw.  This was a relatively  brief episode lasting for a matter of approximately 1 minute.  She has  had no further episodes since that time.  She denies any dyspnea,  orthopnea, or PND.  She has experienced nausea over the past 3 days on  an intermittent basis.  She was seen by Dr. Tenny Craw yesterday and cardiac  biomarkers were drawn, which were normal.  She had an EKG performed that  demonstrated sinus rhythm at a rate of approximately 90 with an abrupt  onset of marked sinus bradycardia for 4 beats, and then returned to  normal sinus rhythm.  There were no significant ST segment or T wave  abnormalities seen.   Candace Cain has also had a history of atrial tachycardia and  bradycardias, including atrial fibrillation, but she denies any recent  light-headedness, syncope, or palpitations.   CURRENT MEDICATIONS:  1. Pindolol 2.5 mg twice daily.  2. Coumadin as directed.  3. Lisinopril hydrochlorothiazide 20/25 mg daily.  4. Ogen 1.25 mg daily.  5. Multivitamin daily.  6. Vitamin C 500 mg daily.  7. Calcium plus D 600 mg twice daily.  8. Magnesium 400 mg 2 daily.   EXAM:  She is alert and oriented and in no acute distress.  Weight is 128 pounds, blood pressure 144/84, heart rate is 59.  HEENT:  Normal.  NECK:  Normal carotid upstrokes without bruits.  Jugular venous pressure  is normal.  LUNGS:  Clear to auscultation  bilaterally.  HEART:  Regular with a heart rate in the 90s followed by abrupt slowing  with a heart rate in the 40s to 50s.  There is a 2/6 ejection murmur on  the left sternal border.  There are no gallops.  ABDOMEN:  Soft and nontender.  No abdominal bruits.  No masses.  EXTREMITIES:  No cyanosis, clubbing, or edema.  Peripheral pulses are 2+  and equal throughout.   A 12-lead EKG demonstrates what is likely an atrial tachycardia based on  variable P wave morphologies with a heart rate of 99.  There are some  premature supraventricular beats present as well.  There are no  significant ST segment or T wave changes.   ASSESSMENT:  Candace Cain is an 77 year old woman with the following  cardiac issues.  1. Chest pain.  Although this was an isolated episode, she has had      ongoing nausea over the last few days and I am concerned about the  possibility of ischemic heart disease.  I think we should pursue an      exercise Myoview study.  Of note, the patient exercises regularly      and should be able to undergo the treadmill portion of an exercise      Myoview.  I asked her to start an 81 mg aspirin on a daily basis,      in addition to her Coumadin, until we sort out whether there is any      significant ischemic heart disease.  She was given a prescription      for sublingual nitroglycerin yesterday, and I have further      instructed her on out to use this.  2. Arrhythmia with alternating atrial tachycardia and sinus      bradycardia.  I think we should perform a 48 hour Holter monitor to      rule out any marked bradycardic episodes, as well as to evaluate      her rhythm and the presence of any significant tachycardia that we      may not have detected at this point.   I will follow up with Candace Cain after the results of her exercise  Myoview and 48 hour Holter monitor are available.     Veverly Fells. Excell Seltzer, MD  Electronically Signed    MDC/MedQ  DD: 03/21/2006  DT:  03/23/2006  Job #: [id number]   cc:   Duke Salvia, MD, [facility]  C. Duane Lope, M.D.

## 2010-05-28 NOTE — Assessment & Plan Note (Signed)
[facility]                           ELECTROPHYSIOLOGY OFFICE NOTE   Candace Cain, WAITES                         MRN:          [id number]  DATE:08/25/2005                            DOB:          October 09, 1920    [name] was seen following hospitalization  End of dictation.                                   Duke Salvia, MD, [facility]   SCK/MedQ  DD:  08/25/2005  DT:  08/26/2005  Job #:  [id number]

## 2010-05-28 NOTE — Letter (Signed)
August 25, 2005     C. Duane Lope, MD  [address]  [place], [place] [address]   RE:  LAVRA, IMLER  MRN:  [id number]  /  DOB:  06/16/1920   Dear Candace Cain:   Hope your summer has been fun.  Sundeep Dunstan was seen at the hospital in  consultation at the request of Carolanne Grumbling.  They have requested  subsequently that I follow her primarily and this was something that she  apparently has discussed with the other cardiology team.  I am glad to do  this if this is what you would like.   In any case, she was found to have atrial fibrillation at hospital.  She was  put on Coumadin.  She also had bradycardia and so we put her on pindolol  which has ISA, as you remember.   She is doing really well.  She has had much fewer palpitations, no  significant bradycardia.  She has a little bit of fatigue, which may be  related to the beta blocker (see below).   Otherwise, she is doing well.  Her current medications include  lisinopril/HCT, the pindolol 2.5 b.i.d., and 81 of aspirin.   On examination, her blood pressure is 128/62, pulse is 51.  Lungs were  clear.  Heart sounds were regular.  Extremities were without edema.   Holter monitor dated from July demonstrated runs of atrial tachycardia as  well as sinus rhythm with rates ranging from the 40s to the 130s, but she  was largely asymptomatic with all of this.   IMPRESSION:  1. Paroxysmal atrial fibrillation.  2. Relative bradycardia.  3. Hypertension.   With her thromboembolic risk factors of age and hypertension, Coumadin is  appropriate long-term therapy.  Aspirin is not an acceptable alternative.   We will plan to see her again in 3-4 months.   Let me know if there is anything I can do to help.    Sincerely,      Duke Salvia, MD, [facility]   SCK/MedQ  DD:  08/25/2005  DT:  08/25/2005  Job #:  [id number]

## 2010-06-04 ENCOUNTER — Emergency Department (HOSPITAL_COMMUNITY): Payer: Medicare Other

## 2010-06-04 ENCOUNTER — Observation Stay (HOSPITAL_COMMUNITY)
Admission: EM | Admit: 2010-06-04 | Discharge: 2010-06-05 | DRG: 313 | Disposition: A | Discharge status: Home / Self Care | Payer: Medicare Other | Attending: Internal Medicine | Admitting: Internal Medicine

## 2010-06-04 DIAGNOSIS — J449 Chronic obstructive pulmonary disease, unspecified: Secondary | ICD-10-CM | POA: Insufficient documentation

## 2010-06-04 DIAGNOSIS — R079 Chest pain, unspecified: Principal | ICD-10-CM | POA: Insufficient documentation

## 2010-06-04 DIAGNOSIS — Z79899 Other long term (current) drug therapy: Secondary | ICD-10-CM | POA: Insufficient documentation

## 2010-06-04 DIAGNOSIS — J4489 Other specified chronic obstructive pulmonary disease: Secondary | ICD-10-CM | POA: Insufficient documentation

## 2010-06-04 DIAGNOSIS — I4891 Unspecified atrial fibrillation: Secondary | ICD-10-CM | POA: Insufficient documentation

## 2010-06-04 DIAGNOSIS — I10 Essential (primary) hypertension: Secondary | ICD-10-CM | POA: Insufficient documentation

## 2010-06-04 DIAGNOSIS — Z7901 Long term (current) use of anticoagulants: Secondary | ICD-10-CM | POA: Insufficient documentation

## 2010-06-04 DIAGNOSIS — I498 Other specified cardiac arrhythmias: Secondary | ICD-10-CM | POA: Insufficient documentation

## 2010-06-04 LAB — CBC
HCT: 39.7 % (ref 36.0–46.0)
Hemoglobin: 13.2 g/dL (ref 12.0–15.0)
MCH: 30 pg (ref 26.0–34.0)
MCHC: 33.2 g/dL (ref 30.0–36.0)
Platelets: 279 10*3/uL (ref 150–400)
RDW: 13.4 % (ref 11.5–15.5)

## 2010-06-04 LAB — URINALYSIS, ROUTINE W REFLEX MICROSCOPIC
Bilirubin Urine: NEGATIVE
Hgb urine dipstick: NEGATIVE
Ketones, ur: 15 mg/dL — AB
Nitrite: NEGATIVE
Specific Gravity, Urine: 1.011 (ref 1.005–1.030)

## 2010-06-04 LAB — CK TOTAL AND CKMB (NOT AT ARMC)
CK, MB: 5.9 ng/mL — ABNORMAL HIGH (ref 0.3–4.0)
Relative Index: 2.1 (ref 0.0–2.5)
Total CK: 283 U/L — ABNORMAL HIGH (ref 7–177)

## 2010-06-04 LAB — PROTIME-INR
INR: 1.68 — ABNORMAL HIGH (ref 0.00–1.49)
Prothrombin Time: 20 seconds — ABNORMAL HIGH (ref 11.6–15.2)

## 2010-06-04 LAB — CARDIAC PANEL(CRET KIN+CKTOT+MB+TROPI)
CK, MB: 4.2 ng/mL — ABNORMAL HIGH (ref 0.3–4.0)
Total CK: 222 U/L — ABNORMAL HIGH (ref 7–177)

## 2010-06-04 LAB — DIFFERENTIAL
Basophils Relative: 1 % (ref 0–1)
Eosinophils Absolute: 0.1 10*3/uL (ref 0.0–0.7)
Eosinophils Relative: 1 % (ref 0–5)
Lymphocytes Relative: 16 % (ref 12–46)
Lymphs Abs: 1.1 10*3/uL (ref 0.7–4.0)
Neutro Abs: 5.4 10*3/uL (ref 1.7–7.7)

## 2010-06-04 LAB — BASIC METABOLIC PANEL
BUN: 20 mg/dL (ref 6–23)
CO2: 28 mEq/L (ref 19–32)
Calcium: 9.9 mg/dL (ref 8.4–10.5)
Chloride: 96 mEq/L (ref 96–112)
GFR calc Af Amer: >60 mL/min (ref >60)
Glucose, Bld: 101 mg/dL — ABNORMAL HIGH (ref 70–99)
Sodium: 133 mEq/L — ABNORMAL LOW (ref 135–145)

## 2010-06-05 LAB — BASIC METABOLIC PANEL
BUN: 19 mg/dL (ref 6–23)
CO2: 30 mEq/L (ref 19–32)
Calcium: 9.3 mg/dL (ref 8.4–10.5)
Creatinine, Ser: 0.66 mg/dL (ref 0.4–1.2)
GFR calc Af Amer: >60 mL/min (ref >60)
GFR calc non Af Amer: >60 mL/min (ref >60)
Potassium: 3.7 mEq/L (ref 3.5–5.1)

## 2010-06-05 LAB — LIPID PANEL
Cholesterol: 179 mg/dL (ref 0–200)
Total CHOL/HDL Ratio: 2 RATIO
Triglycerides: 82 mg/dL (ref <150)

## 2010-06-05 LAB — CBC
Hemoglobin: 12.2 g/dL (ref 12.0–15.0)
MCV: 90.1 fL (ref 78.0–100.0)
RBC: 4.05 MIL/uL (ref 3.87–5.11)

## 2010-06-05 LAB — PROTIME-INR: Prothrombin Time: 21.3 seconds — ABNORMAL HIGH (ref 11.6–15.2)

## 2010-06-05 LAB — CARDIAC PANEL(CRET KIN+CKTOT+MB+TROPI)
CK, MB: 3.5 ng/mL (ref 0.3–4.0)
Relative Index: 1.9 (ref 0.0–2.5)

## 2010-06-10 NOTE — Discharge Summary (Signed)
NAMEILEANE, SANDO NO.:  [id number]  MEDICAL RECORD NO.:  [id number]           PATIENT TYPE:  I  LOCATION:  3708                         FACILITY:  [facility]  PHYSICIAN:  Cassell Clement, M.D. DATE OF BIRTH:  13-Dec-1920  DATE OF ADMISSION:  06/04/2010 DATE OF DISCHARGE:  06/05/2010                              DISCHARGE SUMMARY   PRIMARY CARDIOLOGIST:  Duke Salvia, MD, [facility]  PRIMARY CARE PHYSICIAN:  Miguel Aschoff, MD  PROCEDURES PERFORMED DURING HOSPITALIZATION:  None.  FINAL DISCHARGE DIAGNOSES: 1. Chest pain ruled out for myocardial infarction. 2. History of atrial fibrillation on Coumadin. 3. Hypertension. 4. Chronic obstructive pulmonary disease. 5. Negative stress Myoview 2007. 6. Asymptomatic bradycardia.  HOSPITAL COURSE:  This is an 77 year old female with known history of CAD and paroxysmal atrial fibrillation who presented to the office for recheck of her INR.  She was discussing that she had been having a recent chest pain with the nurse that radiated to her jaw.  A discussion was had with on-call physician.  He decided to bring her to the emergency room for further evaluation.  The patient states that the pain occurred while working in her garden the day prior.  The patient states that the pain came on at rest while she was lying in bed, but not while she was walking on a treadmill.  She does walk on the treadmill an hour 5 times a week.  The patient had cardiac enzymes cycled, which were found to be negative.  The patient's EKG did not show any acute changes and was found to have bradycardia.  However, at a rate of 47 beats per minute.  The patient was monitored overnight with no further symptoms and was seen by Dr. Patty Sermons on further evaluation and found to be stable for discharge.  The patient will follow up with Dr. Graciela Husbands as an outpatient.  In the interim, the patient's amlodipine was increased from 5 mg daily to 10 mg daily, and she  was started on Protonix 40 mg daily.  She will continue to follow up in the [facility] as already scheduled and be seen by her primary care physician for continued medical management.  DISCHARGE LABORATORY DATA:  Troponin less than 0.30, less than 0.30, and less than 0.30 respectively, sodium 137, potassium 3.7, chloride 100, CO2 of 30, glucose 87, BUN 19, creatinine 0.66.  Hemoglobin 12.2, hematocrit 36.5, white blood cells 4.8, platelets 285.  INR 1.83 with a PT at 21.3.  RADIOLOGY:  Chest x-ray dated Jun 04, 2010, revealing stable exam.  No superimposed acute process noted.  DISCHARGE VITAL SIGNS:  Blood pressure 144/73, pulse 51, respirations 16, temperature 97.9, O2 sat 97% on room air.  EKG, sinus bradycardia with a rate of 47 beats per minute with first- degree A-V block with a PR interval of 0.218 milliseconds.  DISCHARGE MEDICATIONS: 1. Amlodipine 10 mg 1 p.o. daily (new higher dose with prescription     provided). 2. Protonix 40 mg 1 p.o. daily (new prescription provided). 3. Trazodone 50 mg 1 p.o. daily. 4. Warfarin 3 mg by mouth daily and to  be treated for PT/INR on     followup appointment. 5. Calcium carbonate 1 tablet by mouth daily. 6. Fish oil 1 tablet by mouth daily. 7. Lisinopril/hydrochlorothiazide 20/12.5 mg b.i.d. 8. Magnesium oxide 400 mg 2 tablets every evening. 9. Multivitamins 1 tablet daily. 10.Nitroglycerin 0.4 mg sublingual p.r.n. chest pain. 11.Probiotics 4 mg 1 tablet by mouth daily. 12.She is to stop ranitidine.  FOLLOWUP PLANS AND APPOINTMENTS: 1. The patient will follow up with Dr. Sherryl Manges.  Our office will     call to make the appointment. 2. The patient will follow up with Dr. Tenny Craw, primary care physician.     She is to call to make that appointment. 3. The patient has been advised on higher dose of Norvasc and     prescription for Protonix. 4. She is advised to bring all medications to followup appointment.  Time spent with  the patient to include physician time 35 minutes.     Bettey Mare. Lyman Bishop, NP   ______________________________ Cassell Clement, M.D.    KML/MEDQ  D:  06/05/2010  T:  06/06/2010  Job:  [id number]  cc:   Miguel Aschoff, M.D.  Electronically Signed by Joni Reining NP on 06/07/2010 08:59:17 PM Electronically Signed by Cassell Clement M.D. on 06/10/2010 12:48:11 PM

## 2010-06-11 ENCOUNTER — Encounter: Payer: Self-pay | Admitting: Internal Medicine

## 2010-06-29 ENCOUNTER — Ambulatory Visit (INDEPENDENT_AMBULATORY_CARE_PROVIDER_SITE_OTHER): Payer: Medicare Other | Admitting: Internal Medicine

## 2010-06-29 DIAGNOSIS — I10 Essential (primary) hypertension: Secondary | ICD-10-CM

## 2010-06-29 DIAGNOSIS — R079 Chest pain, unspecified: Secondary | ICD-10-CM

## 2010-06-29 DIAGNOSIS — I4891 Unspecified atrial fibrillation: Secondary | ICD-10-CM

## 2010-06-29 DIAGNOSIS — I498 Other specified cardiac arrhythmias: Secondary | ICD-10-CM

## 2010-06-29 NOTE — Assessment & Plan Note (Signed)
Well- controlled on current medication 

## 2010-06-29 NOTE — Assessment & Plan Note (Signed)
No known recurrent atrial fibrillation; she continues on warfarin

## 2010-06-29 NOTE — Assessment & Plan Note (Signed)
The patient was hospitalized last month for chest pain. Had some typical features, it is unrelated to exertion and she has continued to work out on her treadmill without symptoms since then. It has resolved with PPI therapy. We will continue to follow it without further evaluation

## 2010-06-29 NOTE — Progress Notes (Signed)
  HPI  Candace Cain is a 77 y.o. female  seen in followup for atrial fibrillation atrial tachycardia and bradycardia. She also has hypertension.She has had no complaints of arrhythmias.The patient denies SOB, chest pain, edema or palpitations She is anticipating a return trip to [place] here  Past Medical History  Diagnosis Date  . Bradycardia   . Alopecia   . Unspecified essential hypertension   . Atrial fibrillation     Past Surgical History  Procedure Date  . Hysterectomy, unspecifed area     Current Outpatient Prescriptions  Medication Sig Dispense Refill  . amLODipine (NORVASC) 5 MG tablet Take 5 mg by mouth daily.        . Calcium Carbonate-Vitamin D (TH CALCIUM CARBONATE-VITAMIN D) 600-400 MG-UNIT per tablet Take 2 tablets by mouth daily.        . fish oil-omega-3 fatty acids 1000 MG capsule Take 2 g by mouth daily.        Marland Kitchen lisinopril-hydrochlorothiazide (PRINZIDE,ZESTORETIC) 20-12.5 MG per tablet Take 2 tablets by mouth daily.        . magnesium oxide (MAG-OX 400) 400 MG tablet Take 400 mg by mouth daily.        . Multiple Vitamin (MULTIVITAMIN) tablet Take 1 tablet by mouth daily.        . nitroGLYCERIN (NITROSTAT) 0.4 MG SL tablet Place 0.4 mg under the tongue every 5 (five) minutes as needed.        . pindolol (VISKEN) 5 MG tablet Take 2.5 mg by mouth 2 (two) times daily.        . ranitidine (ZANTAC) 150 MG capsule Take 150 mg by mouth 2 (two) times daily.        . traZODone (DESYREL) 50 MG tablet Take 50 mg by mouth at bedtime.        . vitamin B-12 (CYANOCOBALAMIN) 500 MCG tablet Take 500 mcg by mouth every other day.        . vitamin D, CHOLECALCIFEROL, 400 UNITS tablet Take 400 Units by mouth daily.        Marland Kitchen warfarin (COUMADIN) 2 MG tablet Take 2 mg by mouth daily. UAD          Not on File  Review of Systems negative except from HPI and PMH  Physical Exam Well developed and well nourished in no acute distress HENT normal E scleral and icterus clear Neck  Supple JVP flat; carotids brisk and full Clear to ausculation Regular rate and rhythm, no murmurs gallops or rub Soft with active bowel sounds No clubbing cyanosis and edema Alert and oriented, grossly normal motor and sensory function Skin Warm and Dry  ECG Sinus rhythm at 63 Intervals 0.20 5.09/24 3 Otherwise normal  Assessment and  Plan

## 2010-06-29 NOTE — Assessment & Plan Note (Signed)
Tolerating her pindolol good exercise tolerance

## 2010-08-02 NOTE — H&P (Signed)
Candace Cain, Candace NO.:  Cain  MEDICAL RECORD NO.:  [id number]           PATIENT TYPE:  I  LOCATION:  3708                         FACILITY:  [facility]  PHYSICIAN:  Hillis Range, MD       DATE OF BIRTH:  [birth year]  DATE OF ADMISSION:  06/04/2010 DATE OF DISCHARGE:                             HISTORY & PHYSICAL   PRIMARY CARDIOLOGIST:  Duke Salvia, MD, [facility]  PRIMARY CARE PROVIDER:  Miguel Aschoff, MD  CHIEF COMPLAINT:  Chest pain.  HISTORY OF PRESENT ILLNESS:  This is an 77 year old female without known coronary artery disease, a history of paroxysmal atrial fibrillation and hypertension who presented for a recheck INR today.  She was discussing with the nurse that she had had recent chest pain that radiated to her jaw.  They discussed with the physician and decided to bring the patient to the emergency department for further evaluation.  Upon evaluation, the patient states the pain started while she was working in her garden yesterday.  The patient states the pain resolved after 2 aspirin and rest.  The patient had no associated symptoms of shortness of breath, nausea, or vomiting.  This morning, the patient states she has felt somewhat weak with mild nausea, but no chest pain or jaw pain.  The patient states that the pain in her chest has increased over the last several months.  She ends up taking approximately 1-3 nitroglycerin every 1-2 weeks.  This pain comes on at rest, while she is lying in bed. Interestingly, the patient uses the treadmill for approximately 1 hour 5 times a week without pain in her chest or radiation to her jaw.  The patient denies any recent fevers or chills.  She states she does have a right-sided earache, but she had been on antibiotic therapy for several weeks ago.  Currently, the patient's EKG is without acute changes.  Her cardiac enzymes are negative x1.  Cardiology was asked to evaluate the patient for further  recommendations.  PAST MEDICAL HISTORY: 1. Asymptomatic bradycardia. 2. Paroxysmal atrial fibrillation, on Coumadin therapy.     a.     Pindolol discontinued in the past secondary to alopecia. 3. Hypertension. 4. COPD. 5. Negative stress Myoview in 2007. 6. Status post total hysterectomy.  SOCIAL HISTORY:  The patient lives in [place] with her husband.  She denies any tobacco or alcohol use.  FAMILY HISTORY:  Noncontributory for premature coronary artery disease. Her father did pass away from myocardial infarction, he was elderly. Her brother passed away at the age of 35 from myocardial infarction.  ALLERGIES:  HYCODAN.  MEDICATIONS: 1. Lisinopril/hydrochlorothiazide 20/12.5 mg twice daily. 2. Trazodone 50 mg daily. 3. Zantac 150 mg twice daily. 4. Nitroglycerin sublingual as needed. 5. Multivitamin daily. 6. Calcium 500 plus D 2 times a day. 7. Fish oil 1000 mg daily. 8. Amlodipine 5 mg daily. 9. Coumadin 2 mg daily. 10.Magnesium oxide 400 mg daily.  REVIEW OF SYSTEMS:  All pertinent positives and negatives as stated in HPI.  All other systems have been reviewed and are negative.  PHYSICAL EXAMINATION:  VITAL SIGNS:  Temperature 97.4, pulse 57, respirations 18, blood pressure 140/72, O2 saturation 99% on room air. GENERAL:  This is a well-developed, well-nourished elderly female, she is in no acute distress. HEENT:  Normal. NECK:  Supple without JVD. HEART:  Regular rate and rhythm with S1 and S2.  No murmur, rub, or gallop noted.  PMI is normal. LUNGS:  Clear to auscultation bilaterally without wheezes, rales, or rhonchi. ABDOMEN:  Soft, nontender, positive bowel sounds x4. EXTREMITIES:  No clubbing, cyanosis, or edema. MUSCULOSKELETAL:  No joint deformities or effusions. NEURO:  Alert and oriented x3, cranial nerves II through XII grossly intact.  Chest x-ray showing no acute cardiopulmonary disease process.  EKG showing normal sinus rhythm at a rate of 61  beats per minute.  Axis is normal.  Intervals are normal.  There are no acute ST-T wave changes.  LABORATORY DATA:  WBC is 7.2, hemoglobin 13.2, hematocrit 39.7, platelet 279.  Sodium 133, potassium 4, chloride 96, bicarb 28, BUN 20, creatinine 0.61, INR 1.8.  Cardiac enzymes negative x1.  ASSESSMENT AND PLAN:  This is an 77 year old female with history of hypertension, paroxysmal atrial fibrillation, and previous normal Myoview in 2007 who presents with chest pain.  The patient's chest pain has typical and atypical features.  At this time given the patient's age, we would recommend medical therapy.  We will currently increase the patient's amlodipine to 2 mg daily.  If the patient continues to have chest pain, we can consider Imdur.  We will also switch the patient's H2 blocker to a PPI for a questionable gastrointestinal etiology.  At this time, we will hold off on adding heparin unless cardiac enzymes become positive.  If cardiac markers remain negative then the patient can go in the morning with further followup as an outpatient.     Leonette Monarch, PA-C   ______________________________ Hillis Range, MD  NB/MEDQ  D:  06/04/2010  T:  06/05/2010  Job:  [id number]  cc:   Miguel Aschoff, M.D. Duke Salvia, MD, [facility]  Electronically Signed by Alen Blew P.A. on 07/04/2010 04:49:33 PM Electronically Signed by Hillis Range MD on 08/02/2010 09:58:53 AM

## 2011-04-21 ENCOUNTER — Ambulatory Visit (INDEPENDENT_AMBULATORY_CARE_PROVIDER_SITE_OTHER): Payer: Medicare Other | Admitting: *Deleted

## 2011-04-21 DIAGNOSIS — I4891 Unspecified atrial fibrillation: Secondary | ICD-10-CM

## 2011-04-21 DIAGNOSIS — Z7901 Long term (current) use of anticoagulants: Secondary | ICD-10-CM | POA: Insufficient documentation

## 2011-04-21 LAB — POCT INR: INR: 1.8

## 2011-05-05 ENCOUNTER — Ambulatory Visit (INDEPENDENT_AMBULATORY_CARE_PROVIDER_SITE_OTHER): Payer: Medicare Other | Admitting: Pharmacist

## 2011-05-05 DIAGNOSIS — Z7901 Long term (current) use of anticoagulants: Secondary | ICD-10-CM

## 2011-05-05 DIAGNOSIS — I4891 Unspecified atrial fibrillation: Secondary | ICD-10-CM

## 2011-05-05 LAB — POCT INR: INR: 2.8

## 2011-06-03 ENCOUNTER — Ambulatory Visit (INDEPENDENT_AMBULATORY_CARE_PROVIDER_SITE_OTHER): Payer: Medicare Other | Admitting: Pharmacist

## 2011-06-03 DIAGNOSIS — I4891 Unspecified atrial fibrillation: Secondary | ICD-10-CM

## 2011-06-03 DIAGNOSIS — Z7901 Long term (current) use of anticoagulants: Secondary | ICD-10-CM

## 2011-06-16 ENCOUNTER — Ambulatory Visit: Payer: Medicare Other | Admitting: Internal Medicine

## 2011-06-30 ENCOUNTER — Ambulatory Visit (INDEPENDENT_AMBULATORY_CARE_PROVIDER_SITE_OTHER): Payer: Medicare Other | Admitting: *Deleted

## 2011-06-30 DIAGNOSIS — Z7901 Long term (current) use of anticoagulants: Secondary | ICD-10-CM

## 2011-06-30 DIAGNOSIS — I4891 Unspecified atrial fibrillation: Secondary | ICD-10-CM

## 2011-06-30 LAB — POCT INR: INR: 2.4

## 2011-07-12 ENCOUNTER — Ambulatory Visit: Payer: Medicare Other | Admitting: Internal Medicine

## 2011-08-04 ENCOUNTER — Ambulatory Visit (INDEPENDENT_AMBULATORY_CARE_PROVIDER_SITE_OTHER): Payer: Medicare Other | Admitting: Pharmacist

## 2011-08-04 DIAGNOSIS — I4891 Unspecified atrial fibrillation: Secondary | ICD-10-CM

## 2011-08-04 DIAGNOSIS — Z7901 Long term (current) use of anticoagulants: Secondary | ICD-10-CM

## 2011-08-04 LAB — POCT INR: INR: 2.5

## 2011-09-07 ENCOUNTER — Encounter: Payer: Self-pay | Admitting: Internal Medicine

## 2011-09-07 ENCOUNTER — Ambulatory Visit (INDEPENDENT_AMBULATORY_CARE_PROVIDER_SITE_OTHER): Payer: Medicare Other | Admitting: *Deleted

## 2011-09-07 ENCOUNTER — Ambulatory Visit (INDEPENDENT_AMBULATORY_CARE_PROVIDER_SITE_OTHER): Payer: Medicare Other | Admitting: Internal Medicine

## 2011-09-07 VITALS — BP 132/72 | Ht 61.5 in | Wt 127.8 lb

## 2011-09-07 DIAGNOSIS — I498 Other specified cardiac arrhythmias: Secondary | ICD-10-CM

## 2011-09-07 DIAGNOSIS — I10 Essential (primary) hypertension: Secondary | ICD-10-CM

## 2011-09-07 DIAGNOSIS — I4891 Unspecified atrial fibrillation: Secondary | ICD-10-CM

## 2011-09-07 DIAGNOSIS — Z7901 Long term (current) use of anticoagulants: Secondary | ICD-10-CM

## 2011-09-07 LAB — BASIC METABOLIC PANEL
Calcium: 9.6 mg/dL (ref 8.4–10.5)
Creatinine, Ser: 0.7 mg/dL (ref 0.4–1.2)
GFR: 81.07 mL/min (ref >60.00)
Sodium: 137 mEq/L (ref 135–145)

## 2011-09-07 LAB — POCT INR: INR: 3.2

## 2011-09-07 NOTE — Assessment & Plan Note (Signed)
Continue current meds  Check BMET

## 2011-09-07 NOTE — Patient Instructions (Signed)
Your physician recommends that you have for lab work: bmp  Your physician wants you to follow-up in: 1 year with Dr. Graciela Husbands. You will receive a reminder letter in the mail two months in advance. If you don't receive a letter, please call our office to schedule the follow-up appointment.

## 2011-09-07 NOTE — Assessment & Plan Note (Signed)
Holding sinus  

## 2011-09-07 NOTE — Progress Notes (Signed)
  HPI  Candace Cain is a 78 y.o. female Seen in followup for atrial fibrillation bradycardia and hypertension. She is doing well currently without symptoms of chest pain shortness of breath or edema.  In the last year her husband has been diagnosed with Parkinson's and he is quite disabled. She finds great reprieve in her flower garden  Past Medical History  Diagnosis Date  . Bradycardia   . Alopecia   . Unspecified essential hypertension   . Atrial fibrillation     Past Surgical History  Procedure Date  . Hysterectomy, unspecifed area     Current Outpatient Prescriptions  Medication Sig Dispense Refill  . amLODipine (NORVASC) 5 MG tablet Take 5 mg by mouth daily.        . Calcium Carbonate-Vitamin D (TH CALCIUM CARBONATE-VITAMIN D) 600-400 MG-UNIT per tablet Take 2 tablets by mouth daily.        . fish oil-omega-3 fatty acids 1000 MG capsule Take 2 g by mouth daily.        Marland Kitchen lisinopril-hydrochlorothiazide (PRINZIDE,ZESTORETIC) 20-12.5 MG per tablet Take 2 tablets by mouth daily.        . Multiple Vitamin (MULTIVITAMIN) tablet Take 1 tablet by mouth daily.        . nitroGLYCERIN (NITROSTAT) 0.4 MG SL tablet Place 0.4 mg under the tongue every 5 (five) minutes as needed.        Marland Kitchen OMEPRAZOLE PO Take by mouth daily.        . traZODone (DESYREL) 50 MG tablet Take 50 mg by mouth at bedtime.        Marland Kitchen warfarin (COUMADIN) 2 MG tablet Take 2 mg by mouth daily. UAD          No Known Allergies  Review of Systems negative except from HPI and PMH  Physical Exam BP 132/72  Ht 5' 1.5" (1.562 m)  Wt 127 lb 12.8 oz (57.97 kg)  BMI 23.76 kg/m2  Well developed and nourished in no acute distress HENT normal Neck supple with JVP-flat Carotids brisk and full without bruits Clear Regular rate and rhythm, no murmurs or gallops Abd-soft with active BS without hepatomegaly No Clubbing cyanosis edema Skin-warm and dry A & Oriented  Grossly normal sensory and motor function    Assessment  and  Plan  ECG  .NSR .21.08.43

## 2011-09-07 NOTE — Assessment & Plan Note (Signed)
stable °

## 2011-10-05 ENCOUNTER — Ambulatory Visit (INDEPENDENT_AMBULATORY_CARE_PROVIDER_SITE_OTHER): Payer: Medicare Other | Admitting: *Deleted

## 2011-10-05 DIAGNOSIS — I4891 Unspecified atrial fibrillation: Secondary | ICD-10-CM

## 2011-10-05 DIAGNOSIS — Z7901 Long term (current) use of anticoagulants: Secondary | ICD-10-CM

## 2014-03-26 ENCOUNTER — Other Ambulatory Visit: Payer: Self-pay | Admitting: Family Medicine

## 2014-03-26 ENCOUNTER — Ambulatory Visit
Admission: RE | Admit: 2014-03-26 | Discharge: 2014-03-26 | Disposition: A | Discharge status: Home / Self Care | Payer: Medicare Other | Source: Ambulatory Visit | Attending: Family Medicine | Admitting: Family Medicine

## 2014-03-26 DIAGNOSIS — J181 Lobar pneumonia, unspecified organism: Principal | ICD-10-CM

## 2014-03-26 DIAGNOSIS — R05 Cough: Secondary | ICD-10-CM

## 2014-03-26 DIAGNOSIS — R0989 Other specified symptoms and signs involving the circulatory and respiratory systems: Secondary | ICD-10-CM

## 2014-03-26 DIAGNOSIS — R059 Cough, unspecified: Secondary | ICD-10-CM

## 2014-03-26 DIAGNOSIS — J189 Pneumonia, unspecified organism: Secondary | ICD-10-CM

## 2014-03-27 ENCOUNTER — Encounter (HOSPITAL_COMMUNITY): Payer: Self-pay

## 2014-03-27 ENCOUNTER — Emergency Department (HOSPITAL_COMMUNITY): Payer: Medicare Other

## 2014-03-27 ENCOUNTER — Inpatient Hospital Stay (HOSPITAL_COMMUNITY)
Admission: EM | Admit: 2014-03-27 | Discharge: 2014-04-01 | DRG: 194 | Disposition: A | Discharge status: Home Health | Payer: Medicare Other | Attending: Internal Medicine | Admitting: Internal Medicine

## 2014-03-27 DIAGNOSIS — J189 Pneumonia, unspecified organism: Secondary | ICD-10-CM | POA: Diagnosis not present

## 2014-03-27 DIAGNOSIS — F419 Anxiety disorder, unspecified: Secondary | ICD-10-CM | POA: Diagnosis present

## 2014-03-27 DIAGNOSIS — I482 Chronic atrial fibrillation: Secondary | ICD-10-CM | POA: Diagnosis not present

## 2014-03-27 DIAGNOSIS — F329 Major depressive disorder, single episode, unspecified: Secondary | ICD-10-CM | POA: Diagnosis present

## 2014-03-27 DIAGNOSIS — I480 Paroxysmal atrial fibrillation: Secondary | ICD-10-CM | POA: Diagnosis present

## 2014-03-27 DIAGNOSIS — K219 Gastro-esophageal reflux disease without esophagitis: Secondary | ICD-10-CM | POA: Diagnosis present

## 2014-03-27 DIAGNOSIS — I10 Essential (primary) hypertension: Secondary | ICD-10-CM | POA: Diagnosis present

## 2014-03-27 DIAGNOSIS — E871 Hypo-osmolality and hyponatremia: Secondary | ICD-10-CM | POA: Diagnosis present

## 2014-03-27 DIAGNOSIS — Z87891 Personal history of nicotine dependence: Secondary | ICD-10-CM | POA: Diagnosis not present

## 2014-03-27 DIAGNOSIS — Z8249 Family history of ischemic heart disease and other diseases of the circulatory system: Secondary | ICD-10-CM | POA: Diagnosis not present

## 2014-03-27 DIAGNOSIS — R52 Pain, unspecified: Secondary | ICD-10-CM

## 2014-03-27 DIAGNOSIS — Z79899 Other long term (current) drug therapy: Secondary | ICD-10-CM | POA: Diagnosis not present

## 2014-03-27 DIAGNOSIS — J181 Lobar pneumonia, unspecified organism: Secondary | ICD-10-CM | POA: Diagnosis not present

## 2014-03-27 DIAGNOSIS — F32A Depression, unspecified: Secondary | ICD-10-CM | POA: Diagnosis present

## 2014-03-27 DIAGNOSIS — E039 Hypothyroidism, unspecified: Secondary | ICD-10-CM | POA: Diagnosis present

## 2014-03-27 DIAGNOSIS — I4891 Unspecified atrial fibrillation: Secondary | ICD-10-CM | POA: Diagnosis present

## 2014-03-27 DIAGNOSIS — E860 Dehydration: Secondary | ICD-10-CM | POA: Diagnosis present

## 2014-03-27 DIAGNOSIS — Z7901 Long term (current) use of anticoagulants: Secondary | ICD-10-CM | POA: Diagnosis not present

## 2014-03-27 DIAGNOSIS — E870 Hyperosmolality and hypernatremia: Secondary | ICD-10-CM | POA: Diagnosis present

## 2014-03-27 DIAGNOSIS — R05 Cough: Secondary | ICD-10-CM | POA: Diagnosis not present

## 2014-03-27 DIAGNOSIS — Z66 Do not resuscitate: Secondary | ICD-10-CM | POA: Diagnosis present

## 2014-03-27 DIAGNOSIS — E875 Hyperkalemia: Secondary | ICD-10-CM | POA: Diagnosis present

## 2014-03-27 DIAGNOSIS — R042 Hemoptysis: Secondary | ICD-10-CM

## 2014-03-27 HISTORY — DX: Hypo-osmolality and hyponatremia: E87.1

## 2014-03-27 LAB — I-STAT CG4 LACTIC ACID, ED: Lactic Acid, Venous: 0.74 mmol/L (ref 0.5–2.0)

## 2014-03-27 LAB — BASIC METABOLIC PANEL
Anion gap: 9 (ref 5–15)
BUN: 17 mg/dL (ref 6–23)
CALCIUM: 9 mg/dL (ref 8.4–10.5)
CHLORIDE: 88 mmol/L — AB (ref 96–112)
CO2: 25 mmol/L (ref 19–32)
Creatinine, Ser: 0.62 mg/dL (ref 0.50–1.10)
GFR calc Af Amer: 89 mL/min — ABNORMAL LOW (ref >90)
GFR calc non Af Amer: 76 mL/min — ABNORMAL LOW (ref >90)
Glucose, Bld: 114 mg/dL — ABNORMAL HIGH (ref 70–99)
Potassium: 4.4 mmol/L (ref 3.5–5.1)
Sodium: 122 mmol/L — ABNORMAL LOW (ref 135–145)

## 2014-03-27 LAB — CBC
HCT: 38.4 % (ref 36.0–46.0)
Hemoglobin: 13.2 g/dL (ref 12.0–15.0)
MCH: 30.7 pg (ref 26.0–34.0)
MCHC: 34.4 g/dL (ref 30.0–36.0)
MCV: 89.3 fL (ref 78.0–100.0)
PLATELETS: 254 10*3/uL (ref 150–400)
RBC: 4.3 MIL/uL (ref 3.87–5.11)
RDW: 12.8 % (ref 11.5–15.5)
WBC: 8 10*3/uL (ref 4.0–10.5)

## 2014-03-27 MED ORDER — SODIUM CHLORIDE 0.9 % IV BOLUS (SEPSIS)
1000.0000 mL | Freq: Once | INTRAVENOUS | Status: AC
Start: 2014-03-27 — End: 2014-03-28
  Administered 2014-03-27: 1000 mL via INTRAVENOUS

## 2014-03-27 MED ORDER — CEFTRIAXONE SODIUM 1 G IJ SOLR
1.0000 g | Freq: Once | INTRAMUSCULAR | Status: AC
  Administered 2014-03-27: 1 g via INTRAVENOUS
  Filled 2014-03-27: qty 10

## 2014-03-27 MED ORDER — ONDANSETRON HCL 4 MG/2ML IJ SOLN
4.0000 mg | Freq: Once | INTRAMUSCULAR | Status: AC
  Administered 2014-03-27: 4 mg via INTRAVENOUS
  Filled 2014-03-27: qty 2

## 2014-03-27 MED ORDER — DEXTROSE 5 % IV SOLN
500.0000 mg | Freq: Once | INTRAVENOUS | Status: AC
  Administered 2014-03-27: 500 mg via INTRAVENOUS
  Filled 2014-03-27: qty 500

## 2014-03-27 NOTE — ED Provider Notes (Signed)
CSN: [id number]     Arrival date & time 03/27/14  2020 History   First MD Initiated Contact with Patient 03/27/14 2120     Chief Complaint  Patient presents with  . Hemoptysis     (Consider location/radiation/quality/duration/timing/severity/associated sxs/prior Treatment) Patient is a 81 y.o. female presenting with cough. The history is provided by the patient.  Cough Cough characteristics:  Non-productive Severity:  Moderate Onset quality:  Sudden Duration:  3 days Timing:  Constant Progression:  Worsening Chronicity:  New Smoker: no   Context: upper respiratory infection   Relieved by:  Nothing Worsened by:  Nothing tried Ineffective treatments: Azithromycin, Levaquin. Associated symptoms: no chills, no fever, no rash, no rhinorrhea and no shortness of breath     Past Medical History  Diagnosis Date  . Bradycardia   . Alopecia   . Unspecified essential hypertension   . Atrial fibrillation    Past Surgical History  Procedure Laterality Date  . Hysterectomy, unspecifed area     Family History  Problem Relation Age of Onset  . Cancer      family history of it  . Coronary artery disease      family history of it   History  Substance Use Topics  . Smoking status: Former Games developermoker  . Smokeless tobacco: Never Used  . Alcohol Use: Yes     Comment: occasional wine    OB History    No data available     Review of Systems  Constitutional: Negative for fever and chills.  HENT: Negative for rhinorrhea.   Respiratory: Negative for cough and shortness of breath.   Gastrointestinal: Negative for vomiting and abdominal pain.  Skin: Negative for rash.  All other systems reviewed and are negative.     Allergies  Review of patient's allergies indicates no known allergies.  Home Medications   Prior to Admission medications   Medication Sig Start Date End Date Taking? Authorizing Provider  amLODipine (NORVASC) 5 MG tablet Take 5 mg by mouth daily.     Yes  Historical Provider, MD  b complex vitamins tablet Take 1 tablet by mouth daily.   Yes Historical Provider, MD  buPROPion (WELLBUTRIN XL) 150 MG 24 hr tablet Take 150 mg by mouth daily.   Yes Historical Provider, MD  Calcium Carbonate-Vitamin D (TH CALCIUM CARBONATE-VITAMIN D) 600-400 MG-UNIT per tablet Take 2 tablets by mouth daily.     Yes Historical Provider, MD  fish oil-omega-3 fatty acids 1000 MG capsule Take 2 g by mouth daily.     Yes Historical Provider, MD  levofloxacin (LEVAQUIN) 500 MG tablet Take 500 mg by mouth daily.   Yes Historical Provider, MD  levothyroxine (SYNTHROID, LEVOTHROID) 25 MCG tablet Take 25 mcg by mouth daily before breakfast.   Yes Historical Provider, MD  lisinopril-hydrochlorothiazide (PRINZIDE,ZESTORETIC) 20-12.5 MG per tablet Take 2 tablets by mouth daily.     Yes Historical Provider, MD  Multiple Vitamin (MULTIVITAMIN) tablet Take 1 tablet by mouth daily.     Yes Historical Provider, MD  nitroGLYCERIN (NITROSTAT) 0.4 MG SL tablet Place 0.4 mg under the tongue every 5 (five) minutes as needed.     Yes Historical Provider, MD  ranitidine (ZANTAC) 75 MG tablet Take 75 mg by mouth daily as needed for heartburn.   Yes Historical Provider, MD  terbinafine (LAMISIL) 250 MG tablet Take 250 mg by mouth daily.   Yes Historical Provider, MD  warfarin (COUMADIN) 2 MG tablet Take 2 mg by mouth daily. Takes 2MG   on Monday, Wednesday, Friday and takes 1/2 tablet ( ) on Tuesday, Thursday, Saturday and sunday   Yes Historical Provider, MD   BP 164/68 mmHg  Pulse 76  Temp(Src) 97.5 F (36.4 C) (Oral)  Resp 18  SpO2 97% Physical Exam  Constitutional: She is oriented to person, place, and time. She appears well-developed and well-nourished. No distress.  HENT:  Head: Normocephalic and atraumatic.  Mouth/Throat: Oropharynx is clear and moist.  Eyes: EOM are normal. Pupils are equal, round, and reactive to light.  Neck: Normal range of motion. Neck supple.  Cardiovascular:  Normal rate and regular rhythm.  Exam reveals no friction rub.   No murmur heard. Pulmonary/Chest: Effort normal and breath sounds normal. No respiratory distress. She has no wheezes. She has no rales.  Abdominal: Soft. She exhibits no distension. There is no tenderness. There is no rebound.  Musculoskeletal: Normal range of motion. She exhibits no edema.  Neurological: She is alert and oriented to person, place, and time.  Skin: She is not diaphoretic.  Nursing note and vitals reviewed.   ED Course  Procedures (including critical care time) Labs Review Labs Reviewed  BASIC METABOLIC PANEL - Abnormal; Notable for the following:    Sodium 122 (*)    Chloride 88 (*)    Glucose, Bld 114 (*)    GFR calc non Af Amer 76 (*)    GFR calc Af Amer 89 (*)    All other components within normal limits  CULTURE, BLOOD (ROUTINE X 2)  CULTURE, BLOOD (ROUTINE X 2)  CBC  I-STAT CG4 LACTIC ACID, ED    Imaging Review Dg Chest 2 View  03/27/2014   CLINICAL DATA:  Productive cough  EXAM: CHEST  2 VIEW  COMPARISON:  03/26/2014  FINDINGS: Cardiac shadow is stable. The lungs are well aerated bilaterally. Increased density is noted in the posterior costophrenic angle on right consistent with an early infiltrate. This was not well appreciated on the prior exam. No acute bony abnormality is seen.  IMPRESSION: Early infiltrate in the right lung base posteriorly.   Electronically Signed   By: Alcide Clever M.D.   On: 03/27/2014 21:55   Dg Chest 2 View  03/26/2014   CLINICAL DATA:  Cough for the last week. Hemoptysis. Fever. Abnormal lung exam on the right.  EXAM: CHEST  2 VIEW  COMPARISON:  06/04/2010  FINDINGS: Heart size is normal. The aorta is unfolded. There may be central bronchial thickening but there is no infiltrate, collapse or effusion. No acute bony finding.  IMPRESSION: Possible bronchitis but no infiltrate or collapse.   Electronically Signed   By: Paulina Fusi M.D.   On: 03/26/2014 16:33     EKG  Interpretation   Date/Time:  Thursday March 27 2014 20:59:15 EDT Ventricular Rate:  76 PR Interval:  185 QRS Duration: 92 QT Interval:  414 QTC Calculation: 465 R Axis:   -41 Text Interpretation:  Sinus rhythm Atrial premature complex Left axis  deviation Low voltage, extremity leads No significant change since last  tracing Confirmed by Gwendolyn Grant  MD, Sehaj Kolden ([id number]) on 03/27/2014 9:21:25 PM      MDM   Final diagnoses:  Hemoptysis  Community acquired pneumonia  Hyponatremia    26F here with general malaise, mild streaky hemoptysis. Recently diagnosed with bronchitis, was on Azithromycin then was put on Levaquin yesterday. Continued worsening. No fevers. Lungs with some rhonchi. RLL infiltrate noted. Hyponatremic on labs. Likely dehydration. Admitted.    Elwin Mocha, MD 03/27/14 [phone]

## 2014-03-27 NOTE — ED Notes (Signed)
Pt has been seen for a productive cough and treated with two different antibiotics, niece states that she's weak and not herself, Dr Tenny Crawoss suspected pneumonia and almost admitted her and she had a chest xray, no results yet

## 2014-03-28 ENCOUNTER — Encounter (HOSPITAL_COMMUNITY): Payer: Self-pay | Admitting: Internal Medicine

## 2014-03-28 DIAGNOSIS — F32A Depression, unspecified: Secondary | ICD-10-CM | POA: Diagnosis present

## 2014-03-28 DIAGNOSIS — E871 Hypo-osmolality and hyponatremia: Secondary | ICD-10-CM

## 2014-03-28 DIAGNOSIS — J189 Pneumonia, unspecified organism: Principal | ICD-10-CM

## 2014-03-28 DIAGNOSIS — F329 Major depressive disorder, single episode, unspecified: Secondary | ICD-10-CM

## 2014-03-28 DIAGNOSIS — I482 Chronic atrial fibrillation: Secondary | ICD-10-CM

## 2014-03-28 DIAGNOSIS — E039 Hypothyroidism, unspecified: Secondary | ICD-10-CM | POA: Diagnosis present

## 2014-03-28 DIAGNOSIS — I10 Essential (primary) hypertension: Secondary | ICD-10-CM

## 2014-03-28 DIAGNOSIS — R042 Hemoptysis: Secondary | ICD-10-CM

## 2014-03-28 DIAGNOSIS — J181 Lobar pneumonia, unspecified organism: Secondary | ICD-10-CM

## 2014-03-28 DIAGNOSIS — K219 Gastro-esophageal reflux disease without esophagitis: Secondary | ICD-10-CM

## 2014-03-28 DIAGNOSIS — E860 Dehydration: Secondary | ICD-10-CM

## 2014-03-28 HISTORY — DX: Gastro-esophageal reflux disease without esophagitis: K21.9

## 2014-03-28 HISTORY — DX: Depression, unspecified: F32.A

## 2014-03-28 HISTORY — DX: Pneumonia, unspecified organism: J18.9

## 2014-03-28 HISTORY — DX: Hypothyroidism, unspecified: E03.9

## 2014-03-28 HISTORY — DX: Dehydration: E86.0

## 2014-03-28 LAB — CBC
HCT: 33.7 % — ABNORMAL LOW (ref 36.0–46.0)
HEMOGLOBIN: 11.6 g/dL — AB (ref 12.0–15.0)
MCH: 30.4 pg (ref 26.0–34.0)
MCHC: 34.4 g/dL (ref 30.0–36.0)
MCV: 88.5 fL (ref 78.0–100.0)
Platelets: 270 10*3/uL (ref 150–400)
RBC: 3.81 MIL/uL — AB (ref 3.87–5.11)
RDW: 12.5 % (ref 11.5–15.5)
WBC: 6.7 10*3/uL (ref 4.0–10.5)

## 2014-03-28 LAB — EXPECTORATED SPUTUM ASSESSMENT W REFEX TO RESP CULTURE

## 2014-03-28 LAB — BASIC METABOLIC PANEL
Anion gap: 9 (ref 5–15)
BUN: 12 mg/dL (ref 6–23)
CHLORIDE: 89 mmol/L — AB (ref 96–112)
CO2: 25 mmol/L (ref 19–32)
Calcium: 8.2 mg/dL — ABNORMAL LOW (ref 8.4–10.5)
Creatinine, Ser: 0.54 mg/dL (ref 0.50–1.10)
GFR calc Af Amer: >90 mL/min (ref >90)
GFR, EST NON AFRICAN AMERICAN: 80 mL/min — AB (ref >90)
GLUCOSE: 86 mg/dL (ref 70–99)
POTASSIUM: 3.8 mmol/L (ref 3.5–5.1)
SODIUM: 123 mmol/L — AB (ref 135–145)

## 2014-03-28 LAB — PHOSPHORUS: PHOSPHORUS: 2.3 mg/dL (ref 2.3–4.6)

## 2014-03-28 LAB — EXPECTORATED SPUTUM ASSESSMENT W GRAM STAIN, RFLX TO RESP C

## 2014-03-28 LAB — PROTIME-INR
INR: 1.49 (ref 0.00–1.49)
PROTHROMBIN TIME: 18.1 s — AB (ref 11.6–15.2)

## 2014-03-28 LAB — INFLUENZA PANEL BY PCR (TYPE A & B)
H1N1FLUPCR: NOT DETECTED
Influenza A By PCR: NEGATIVE
Influenza B By PCR: NEGATIVE

## 2014-03-28 LAB — TSH: TSH: 1.148 u[IU]/mL (ref 0.350–4.500)

## 2014-03-28 LAB — MAGNESIUM: Magnesium: 1.5 mg/dL (ref 1.5–2.5)

## 2014-03-28 LAB — STREP PNEUMONIAE URINARY ANTIGEN: Strep Pneumo Urinary Antigen: NEGATIVE

## 2014-03-28 MED ORDER — ALUM & MAG HYDROXIDE-SIMETH 200-200-20 MG/5ML PO SUSP
15.0000 mL | ORAL | Status: DC | PRN
  Administered 2014-03-28: 15 mL via ORAL
  Filled 2014-03-28: qty 30

## 2014-03-28 MED ORDER — B COMPLEX-C PO TABS
1.0000 | ORAL_TABLET | Freq: Every day | ORAL | Status: DC
  Administered 2014-03-28 – 2014-04-01 (×5): 1 via ORAL
  Filled 2014-03-28 (×5): qty 1

## 2014-03-28 MED ORDER — AMLODIPINE BESYLATE 5 MG PO TABS
5.0000 mg | ORAL_TABLET | Freq: Every day | ORAL | Status: DC
  Administered 2014-03-28 – 2014-04-01 (×5): 5 mg via ORAL
  Filled 2014-03-28 (×5): qty 1

## 2014-03-28 MED ORDER — WARFARIN SODIUM 3 MG PO TABS
3.0000 mg | ORAL_TABLET | Freq: Once | ORAL | Status: AC
  Administered 2014-03-28: 3 mg via ORAL
  Filled 2014-03-28: qty 1

## 2014-03-28 MED ORDER — LISINOPRIL-HYDROCHLOROTHIAZIDE 20-12.5 MG PO TABS: 2.0000 | ORAL_TABLET | Freq: Every day | ORAL | Status: DC

## 2014-03-28 MED ORDER — IPRATROPIUM-ALBUTEROL 0.5-2.5 (3) MG/3ML IN SOLN
3.0000 mL | Freq: Four times a day (QID) | RESPIRATORY_TRACT | Status: DC | PRN
  Filled 2014-03-28: qty 3

## 2014-03-28 MED ORDER — B COMPLEX PO TABS: 1.0000 | ORAL_TABLET | Freq: Every day | ORAL | Status: DC

## 2014-03-28 MED ORDER — NITROGLYCERIN 0.4 MG SL SUBL: 0.4000 mg | SUBLINGUAL_TABLET | SUBLINGUAL | Status: DC | PRN

## 2014-03-28 MED ORDER — DEXTROSE 5 % IV SOLN
1.0000 g | Freq: Every day | INTRAVENOUS | Status: DC
  Administered 2014-03-28 – 2014-03-31 (×4): 1 g via INTRAVENOUS
  Filled 2014-03-28 (×4): qty 10

## 2014-03-28 MED ORDER — LEVOTHYROXINE SODIUM 25 MCG PO TABS
25.0000 ug | ORAL_TABLET | Freq: Every day | ORAL | Status: DC
  Administered 2014-03-28 – 2014-04-01 (×5): 25 ug via ORAL
  Filled 2014-03-28 (×6): qty 1

## 2014-03-28 MED ORDER — PANTOPRAZOLE SODIUM 40 MG PO TBEC
40.0000 mg | DELAYED_RELEASE_TABLET | Freq: Every day | ORAL | Status: DC
  Administered 2014-03-28 – 2014-04-01 (×4): 40 mg via ORAL
  Filled 2014-03-28 (×5): qty 1

## 2014-03-28 MED ORDER — BUDESONIDE 0.25 MG/2ML IN SUSP
0.2500 mg | Freq: Two times a day (BID) | RESPIRATORY_TRACT | Status: DC
  Administered 2014-03-28 – 2014-04-01 (×8): 0.25 mg via RESPIRATORY_TRACT
  Filled 2014-03-28 (×8): qty 2

## 2014-03-28 MED ORDER — HYDROCOD POLST-CHLORPHEN POLST 10-8 MG/5ML PO LQCR
5.0000 mL | Freq: Two times a day (BID) | ORAL | Status: DC | PRN
  Administered 2014-03-30 – 2014-04-01 (×2): 5 mL via ORAL
  Filled 2014-03-28 (×2): qty 5

## 2014-03-28 MED ORDER — ONDANSETRON HCL 4 MG/2ML IJ SOLN
4.0000 mg | Freq: Four times a day (QID) | INTRAMUSCULAR | Status: DC | PRN
  Administered 2014-03-28: 4 mg via INTRAVENOUS
  Filled 2014-03-28: qty 2

## 2014-03-28 MED ORDER — WARFARIN - PHARMACIST DOSING INPATIENT: Freq: Every day | Status: DC

## 2014-03-28 MED ORDER — LISINOPRIL 20 MG PO TABS
20.0000 mg | ORAL_TABLET | Freq: Every day | ORAL | Status: DC
  Administered 2014-03-28 – 2014-03-31 (×4): 20 mg via ORAL
  Filled 2014-03-28 (×5): qty 1

## 2014-03-28 MED ORDER — DEXTROSE 5 % IV SOLN
500.0000 mg | Freq: Every day | INTRAVENOUS | Status: DC
  Administered 2014-03-28 – 2014-03-31 (×4): 500 mg via INTRAVENOUS
  Filled 2014-03-28 (×4): qty 500

## 2014-03-28 MED ORDER — HYDROCHLOROTHIAZIDE 12.5 MG PO CAPS
12.5000 mg | ORAL_CAPSULE | Freq: Every day | ORAL | Status: DC
  Administered 2014-03-28 – 2014-03-30 (×3): 12.5 mg via ORAL
  Filled 2014-03-28 (×3): qty 1

## 2014-03-28 MED ORDER — FAMOTIDINE 20 MG PO TABS
20.0000 mg | ORAL_TABLET | Freq: Every day | ORAL | Status: DC
  Administered 2014-03-28 – 2014-03-31 (×4): 20 mg via ORAL
  Filled 2014-03-28 (×5): qty 1

## 2014-03-28 MED ORDER — BUPROPION HCL ER (XL) 150 MG PO TB24
150.0000 mg | ORAL_TABLET | Freq: Every day | ORAL | Status: DC
  Administered 2014-03-28 – 2014-04-01 (×5): 150 mg via ORAL
  Filled 2014-03-28 (×5): qty 1

## 2014-03-28 MED ORDER — ISOSORB DINITRATE-HYDRALAZINE 20-37.5 MG PO TABS
1.0000 | ORAL_TABLET | Freq: Two times a day (BID) | ORAL | Status: DC
  Administered 2014-03-28 – 2014-04-01 (×9): 1 via ORAL
  Filled 2014-03-28 (×10): qty 1

## 2014-03-28 MED ORDER — AZITHROMYCIN 500 MG IV SOLR: 500.0000 mg | INTRAVENOUS | Status: DC

## 2014-03-28 MED ORDER — SODIUM CHLORIDE 0.9 % IV SOLN
INTRAVENOUS | Status: DC
  Administered 2014-03-28 – 2014-03-31 (×5): via INTRAVENOUS

## 2014-03-28 MED ORDER — DEXTROSE 5 % IV SOLN: 1.0000 g | INTRAVENOUS | Status: DC

## 2014-03-28 NOTE — Progress Notes (Signed)
ANTICOAGULATION CONSULT NOTE - Initial Consult  Pharmacy Consult for warfarin Indication: paroxysmal atrial fibrillation  No Known Allergies  Patient Measurements:    Vital Signs: Temp: 99 F (37.2 C) (03/18 0828) Temp Source: Oral (03/18 0741) BP: 141/83 mmHg (03/18 1150) Pulse Rate: 59 (03/18 1150)  Labs:  Recent Labs  03/27/14 2141 03/28/14 0517 03/28/14 0530 03/28/14 1125  HGB 13.2 11.6*  --   --   HCT 38.4 33.7*  --   --   PLT 254 270  --   --   LABPROT  --   --   --  18.1*  INR  --   --   --  1.49  CREATININE 0.62  --  0.54  --     CrCl cannot be calculated (Unknown ideal weight.).   Medical History: Past Medical History  Diagnosis Date  . Bradycardia   . Alopecia   . Unspecified essential hypertension   . Atrial fibrillation     Medications:  Scheduled:  . amLODipine  5 mg Oral Daily  . azithromycin  500 mg Intravenous QHS  . B-complex with vitamin C  1 tablet Oral Daily  . budesonide (PULMICORT) nebulizer solution  0.25 mg Nebulization BID  . buPROPion  150 mg Oral Daily  . cefTRIAXone (ROCEPHIN)  IV  1 g Intravenous QHS  . famotidine  20 mg Oral QHS  . lisinopril  20 mg Oral Daily   And  . hydrochlorothiazide  12.5 mg Oral Daily  . isosorbide-hydrALAZINE  1 tablet Oral BID  . levothyroxine  25 mcg Oral QAC breakfast  . pantoprazole  40 mg Oral Q1200   Infusions:  . sodium chloride 75 mL/hr at 03/28/14 0512   PRN: chlorpheniramine-HYDROcodone, ipratropium-albuterol, nitroGLYCERIN, ondansetron (ZOFRAN) IV  Assessment: 81 y/o on chronic warfarin for PAF, admitted through ED 03/27/14 with CAP. There was reportedly some associated mild transient hemoptysis.  PTA warfarin dosage reported as 2 mg M,W,F and 1mg  Tues, Thurs, Sat, Sun.  Last dose was taken on 3/17.  INR on admission, 1.49, is subtherapeutic.  Pharmacy is consulted to resume warfarin dosing while inpatient.  Today, 03/28/2014:  INR 1.49  CBC: Hgb 11.6, Plt 270  No bleeding or  complications reported.  Diet: heart-healthy, carb-modified  Drug-drug interaction: antibiotics may increase INR.  Goal of Therapy:  INR 2-3   Plan:   Warfarin 3mg  PO today at 1800 x1  Daily INR   Lynann Beaverhristine Wileen Duncanson PharmD, BCPS Pager [phone] 03/28/2014 1:52 PM

## 2014-03-28 NOTE — ED Notes (Signed)
Report called to floor nurse Danny LawlessLancy, RN

## 2014-03-28 NOTE — Progress Notes (Signed)
Duplicate preliminary note - please disregard.

## 2014-03-28 NOTE — Evaluation (Signed)
Physical Therapy Evaluation Patient Details Name: Candace Cain MRN: [id number] DOB: December 16, 1920 Today's Date: 03/28/2014   History of Present Illness  81 yo female admitted 03/27/14 with cough, SOB, blood tinged sputum and general malaise R/O pneumonia.H/O afib.  Clinical Impression  Patient is ambulating but is weak. Patient will benefit from PT to address problems listed in note below.    Follow Up Recommendations Home health PT;SNF (no family present to discuss 24/7 caregivers  that patient will need at DC until back to [facility])    Equipment Recommendations  None recommended by PT    Recommendations for Other Services       Precautions / Restrictions Precautions Precautions: Fall      Mobility  Bed Mobility Overal bed mobility: Independent                Transfers Overall transfer level: Needs assistance Equipment used: None Transfers: Sit to/from Stand Sit to Stand: Supervision            Ambulation/Gait Ambulation/Gait assistance: Min guard Ambulation Distance (Feet): 20 Feet Assistive device: Rolling walker (2 wheeled)       General Gait Details: walked x 20' with Rw then Without , holding onto door and bed. declined ambulation in [place].  Stairs            Wheelchair Mobility    Modified Rankin (Stroke Patients Only)       Balance Overall balance assessment: Needs assistance         Standing balance support: During functional activity;No upper extremity supported Standing balance-Leahy Scale: Fair                               Pertinent Vitals/Pain Pain Assessment: Faces Faces Pain Scale: Hurts little more Pain Location: under L lower ribs Pain Descriptors / Indicators: Aching;Cramping Pain Intervention(s): Monitored during session    Home Living Family/patient expects to be discharged to:: Private residence Living Arrangements: Alone Available Help at Discharge: Family Type of Home: Apartment       Home  Layout: One level Home Equipment: None Additional Comments: spouse is in skilled    Prior Function Level of Independence: Independent               Hand Dominance        Extremity/Trunk Assessment   Upper Extremity Assessment: Generalized weakness           Lower Extremity Assessment: Generalized weakness         Communication   Communication: No difficulties  Cognition Arousal/Alertness: Awake/alert Behavior During Therapy: WFL for tasks assessed/performed Overall Cognitive Status: Within Functional Limits for tasks assessed                      General Comments      Exercises        Assessment/Plan    PT Assessment Patient needs continued PT services  PT Diagnosis Difficulty walking   PT Problem List Decreased strength;Decreased activity tolerance;Decreased mobility;Pain  PT Treatment Interventions DME instruction;Gait training;Functional mobility training;Therapeutic activities;Therapeutic exercise;Patient/family education   PT Goals (Current goals can be found in the Care Plan section) Acute Rehab PT Goals Patient Stated Goal: return home PT Goal Formulation: With patient Time For Goal Achievement: 04/11/14 Potential to Achieve Goals: Good    Frequency Min 3X/week   Barriers to discharge Decreased caregiver support      Co-evaluation  End of Session   Activity Tolerance: Patient limited by fatigue Patient left: in bed;with call bell/phone within reach Nurse Communication: Mobility status         Time: 1610-9604 PT Time Calculation (min) (ACUTE ONLY): 15 min   Charges:   PT Evaluation $Initial PT Evaluation Tier I: 1 Procedure     PT G CodesRada Cain 03/28/2014, 4:56 PM

## 2014-03-28 NOTE — ED Notes (Signed)
Bed: WA31 Expected date:  Expected time:  Means of arrival:  Comments: 

## 2014-03-28 NOTE — ED Notes (Signed)
MD at bedside. 

## 2014-03-28 NOTE — ED Notes (Signed)
Call placed to floor for report, RN unable to take d/t change of shift

## 2014-03-28 NOTE — Progress Notes (Signed)
CARE MANAGEMENT NOTE 03/28/2014  Patient:  Candace Cain,Candace Cain   Account Number:  [id number]  Date Initiated:  03/28/2014  Documentation initiated by:  DAVIS,RHONDA  Subjective/Objective Assessment:   confirmed pna by cxr and failed outpt treatment     Action/Plan:   home when stable   Anticipated DC Date:  03/31/2014   Anticipated DC Plan:  HOME/SELF CARE  In-house referral  NA      DC Planning Services  CM consult      PAC Choice  NA   Choice offered to / List presented to:             Status of service:  In process, will continue to follow Medicare Important Message given?   (If response is "NO", the following Medicare IM given date fields will be blank) Date Medicare IM given:   Medicare IM given by:   Date Additional Medicare IM given:   Additional Medicare IM given by:    Discharge Disposition:    Per UR Regulation:  Reviewed for med. necessity/level of care/duration of stay  If discussed at Long Length of Stay Meetings, dates discussed:    Comments:  March 28, 2014/Rhonda L. Earlene Plateravis, RN, BSN, CCM. Case Management [employer] [phone] No discharge needs present of time of review.

## 2014-03-28 NOTE — H&P (Signed)
[facility] History and Physical  Yliana Gravois ZOX:[id number] DOB: [birth year] DOA: 03/27/2014  Referring physician: Dr. Gwendolyn Grant PCP:  Duane Lope, MD   Chief Complaint: cough, SOB, blood tinged sputum and general malaise.  HPI: Candace Cain is a 81 y.o. female with PMH significant for HTN, hypothyroidism, atrial fibrillation (PAF, on coumadin), GERD and depression/anxiety; who presented to ED with complaints of SOB, productive cough and mild hemoptysis. Patient with symptoms since Monday (03/24/14) and slowly worsening. She saw PCP with presentation of symptoms, and was started on antibiotic (Z-pack) for presumed PNA; symptoms continue and antibiotic was exchanged (to Levaquin); patient continue having worsening of symptoms, feeling weak and having difficulty keeping things down. Patient was referred to ED for admission; in ED, found with PNA on CXR, positive blood tinged in sputum and moderate dehydration with hyponatremia. [facility] called to admit patient for further evaluation and treatment.   Review of Systems:  Negative except as mentioned on HPI.  Past Medical History  Diagnosis Date  . Bradycardia   . Alopecia   . Unspecified essential hypertension   . Atrial fibrillation    Past Surgical History  Procedure Laterality Date  . Hysterectomy, unspecifed area     Social History:  reports that she has quit smoking. She has never used smokeless tobacco. She reports that she drinks alcohol. She reports that she does not use illicit drugs.  No Known Allergies  Family History  Problem Relation Age of Onset  . Cancer      family history of it  . Coronary artery disease      family history of it    Prior to Admission medications   Medication Sig Start Date End Date Taking? Authorizing Provider  amLODipine (NORVASC) 5 MG tablet Take 5 mg by mouth daily.     Yes Historical Provider, MD  b complex vitamins tablet Take 1 tablet by mouth daily.   Yes Historical Provider, MD  buPROPion  (WELLBUTRIN XL) 150 MG 24 hr tablet Take 150 mg by mouth daily.   Yes Historical Provider, MD  Calcium Carbonate-Vitamin D (TH CALCIUM CARBONATE-VITAMIN D) 600-400 MG-UNIT per tablet Take 2 tablets by mouth daily.     Yes Historical Provider, MD  fish oil-omega-3 fatty acids 1000 MG capsule Take 2 g by mouth daily.     Yes Historical Provider, MD  levofloxacin (LEVAQUIN) 500 MG tablet Take 500 mg by mouth daily.   Yes Historical Provider, MD  levothyroxine (SYNTHROID, LEVOTHROID) 25 MCG tablet Take 25 mcg by mouth daily before breakfast.   Yes Historical Provider, MD  lisinopril-hydrochlorothiazide (PRINZIDE,ZESTORETIC) 20-12.5 MG per tablet Take 2 tablets by mouth daily.     Yes Historical Provider, MD  Multiple Vitamin (MULTIVITAMIN) tablet Take 1 tablet by mouth daily.     Yes Historical Provider, MD  nitroGLYCERIN (NITROSTAT) 0.4 MG SL tablet Place 0.4 mg under the tongue every 5 (five) minutes as needed.     Yes Historical Provider, MD  ranitidine (ZANTAC) 75 MG tablet Take 75 mg by mouth daily as needed for heartburn.   Yes Historical Provider, MD  terbinafine (LAMISIL) 250 MG tablet Take 250 mg by mouth daily.   Yes Historical Provider, MD  warfarin (COUMADIN) 2 MG tablet Take 2 mg by mouth daily. Takes  on Monday, Wednesday, Friday and takes 1/2 tablet ( ) on Tuesday, Thursday, Saturday and sunday   Yes Historical Provider, MD   Physical Exam: Filed Vitals:   03/27/14 2050 03/28/14 0019  BP: 164/68 154/83  Pulse: 76 73  Temp: 97.5 F (36.4 C) 98 F (36.7 C)  TempSrc: Oral Oral  Resp: 18 18  SpO2: 97% 97%    Wt Readings from Last 3 Encounters:  09/07/11 57.97 kg (127 lb 12.8 oz)  04/17/09 58.514 kg (129 lb)  04/22/08 58.854 kg (129 lb 12 oz)    General:  Super nice elderly female, in no acute distress, complaining of intermittent coughing spells and some nausea. No CP, no vomiting, no fever. Endorses generalized weakness  Eyes: PERRL, normal lids, irises & conjunctiva,  No  icterus, no nystagmus  ENT: grossly normal hearing, lips & tongue, no erythema or exudates, no drainage out of ears or nostrils  Neck: no LAD, masses or thyromegaly, no JVD Cardiovascular: Rate controlled, no rubs, murmurs or gallops. No LE edema. Telemetry: sinus rhythm in ED telemetry Respiratory: mils exp wheezing, diffuse rhonchi and decrease BS at bases, no frank crackles. No use of accessory muscles appreciated. Abdomen: soft, nt, nd, positive BS Skin: no rash or induration seen on exam Musculoskeletal: grossly normal tone BUE/BLE, FROM Psychiatric: grossly normal mood and affect, speech fluent and appropriate Neurologic: grossly non-focal.          Labs on Admission:  Basic Metabolic Panel:  Recent Labs Lab 03/27/14 2141  NA 122*  K 4.4  CL 88*  CO2 25  GLUCOSE 114*  BUN 17  CREATININE 0.62  CALCIUM 9.0   CBC:  Recent Labs Lab 03/27/14 2141  WBC 8.0  HGB 13.2  HCT 38.4  MCV 89.3  PLT 254   Radiological Exams on Admission: Dg Chest 2 View  03/27/2014   CLINICAL DATA:  Productive cough  EXAM: CHEST  2 VIEW  COMPARISON:  03/26/2014  FINDINGS: Cardiac shadow is stable. The lungs are well aerated bilaterally. Increased density is noted in the posterior costophrenic angle on right consistent with an early infiltrate. This was not well appreciated on the prior exam. No acute bony abnormality is seen.  IMPRESSION: Early infiltrate in the right lung base posteriorly.   Electronically Signed   By: Alcide Clever M.D.   On: 03/27/2014 21:55   Dg Chest 2 View  03/26/2014   CLINICAL DATA:  Cough for the last week. Hemoptysis. Fever. Abnormal lung exam on the right.  EXAM: CHEST  2 VIEW  COMPARISON:  06/04/2010  FINDINGS: Heart size is normal. The aorta is unfolded. There may be central bronchial thickening but there is no infiltrate, collapse or effusion. No acute bony finding.  IMPRESSION: Possible bronchitis but no infiltrate or collapse.   Electronically Signed   By: Paulina Fusi M.D.   On: 03/26/2014 16:33    EKG: No acute ischemic changes, sinus rhythm and rate controlled; left axis deviation and no ST segment abnormalities  Assessment/Plan 1-SOB, cough and blood tinged sputum: due to community acquire Lobar pneumonia due to unspecified organism. -will admit to med-surg -start tx with zithromax and rocephin -follow blood cx, strp antigen and legionella antigen in urine  -pulmicort, PRN Duonebs, PRN antitussives, flutter valve and as needed oxygen supplementation -IVF's and supportive care -PRN antipyretics  2-Essential hypertension: will hold HCTZ with hyponatremia and lisinopril with ongoing cough and mild hyperkalemia -will use bidil and norvasc to control BP -heart healthy diet  3-Paroxismal Atrial fibrillation:stable and with controlled rate -continue coumadin per pharmacy for now -if mild episode of hemoptysis continue/recurred, will d/c anticoagulation  4-Hyponatremia: due to PNA, use of diuretics and poor intake -will provide fluid resuscitation and  follow electrolytes trend -will check TSH  5-Dehydration, moderate: as mentioned above, will provide IVF's  6-Hypothyroidism: will TSH -continue synthroid  7-GERD (gastroesophageal reflux disease): continue PPI and famotidine  8-Depression/anxiety: continue Wellbutrin   9-physical deconditioning: will ask PT/OT to evaluate patient and provide rec's for discharge plans  Code Status: DNR DVT Prophylaxis:on coumadin  Family Communication: care giver at bedside Disposition Plan:LOS > 2 midnights, med-surg bed; inpatient  Time spent: 50 minutes  Vassie LollMadera, Ahlani Wickes [facility] Pager [phone]

## 2014-03-28 NOTE — ED Notes (Signed)
Second call placed for report, on hold for 5 min

## 2014-03-28 NOTE — ED Notes (Signed)
[facility] to [facility]

## 2014-03-28 NOTE — Progress Notes (Signed)
Patient ID: Candace Cain, female   DOB: 09-05-1920, 81 y.o.   MRN: [id number] [facility] PROGRESS NOTE  Shevette Bess WUJ:[id number] DOB: Dec 16, 1920 DOA: 03/27/2014 PCP:  Duane Lope, MD  Brief narrative:    Addendum to admission note done 03/28/2014 81 year old female with past medical history of hypertension, hypothyroidism, atrial fibrillation on anticoagulation with Coumadin, anxiety and depression who presented to [facility] hospital with complaints of worsening shortness of breath with associated cough productive of whitish sputum. Patient reports her symptoms started couple of days prior to this admission. She saw her primary care physician and was started on Z-Pak for possible pneumonia but she did not experience significant symptomatic relief. This was then changed to Levaquin but per patient her symptoms continued to get worse even while on Levaquin. On admission, patient was hemodynamically stable. Chest x-ray showed early infiltrate in the right lung base. Patient was started on empiric azithromycin and Rocephin.   Assessment/Plan:    Principal Problem:   Lobar pneumonia due to unspecified organism / community-acquired pneumonia - Patient started on azithromycin and Rocephin on the admission. - Follow up legionella and strep pneumonia. Order placed for influenza, respiratory viral panel. The droplet precaution - Respiratory status is stable. Continue oxygen support via nasal cannula to keep oxygen saturation above 90%. - Continue Pulmicort nebulizer twice daily - Continue duoneb every 6 hours as needed for shortness of breath or wheezing  Active Problems:   Essential hypertension - Resume home dose of Prinzide - Continue Norvasc, Bidil    Atrial fibrillation - CHADS2 vasc score 5 - Rate controlled - Continue anticoagulation with Coumadin, pharmacy dosing    Hypothyroidism - Continue Synthroid      Depression - Continue Wellbutrin    Hyponatremia - Sodium level  122 on the admission. We need repeat admission labs to see sodium trend.    DVT Prophylaxis  - We'll place order for SCDs bilaterally   Code Status: DNR/DNI Family Communication:  plan of care discussed with the patient Disposition Plan: Patient is receiving treatment for community-acquired pneumonia. Not stable for discharge.  IV access:  Peripheral IV  Procedures and diagnostic studies:    Dg Chest 2 View 03/27/2014  Early infiltrate in the right lung base posteriorly.     Dg Chest 2 View 03/26/2014   Possible bronchitis but no infiltrate or collapse.      Medical Consultants:  None  Other Consultants:  Physical therapy  IAnti-Infectives:   Azithromycin 03/28/2014 --> Rocephin 03/28/2014 -->   Manson Passey, MD  [facility] Pager [phone]  If 7PM-7AM, please contact night-coverage [email or web address] Password [facility] 03/28/2014, 9:51 AM   LOS: 1 day    HPI/Subjective: No acute overnight events.  Objective: Filed Vitals:   03/28/14 0019 03/28/14 0528 03/28/14 0741 03/28/14 0828  BP: 154/83 155/73 156/74 168/87  Pulse: 73 72 70 65  Temp: 98 F (36.7 C) 98.8 F (37.1 C) 98.6 F (37 C) 99 F (37.2 C)  TempSrc: Oral Oral Oral   Resp: SpO2: 97% 94% 97% 97%    Intake/Output Summary (Last 24 hours) at 03/28/14 0951 Last data filed at 03/28/14 0530  Gross per 24 hour  Intake      0 ml  Output    850 ml  Net   -850 ml    Exam:   General:  Pt is alert, follows commands appropriately, not in acute distress  Cardiovascular: Rate controlled, S1/S2 appreciated  Respiratory: Mild wheezing in upper lung  lobes, no crackles  Abdomen: Soft, non tender, non distended, bowel sounds present  Extremities: No edema, pulses DP and PT palpable bilaterally  Neuro: Grossly nonfocal  Data Reviewed: Basic Metabolic Panel:  Recent Labs Lab 03/27/14 2141 03/28/14 0517  NA 122*  --   K 4.4  --   CL 88*  --   CO2 25  --   GLUCOSE 114*  --   BUN  17  --   CREATININE 0.62  --   CALCIUM 9.0  --   MG  --  1.5  PHOS  --  2.3   Liver Function Tests: No results for input(s): AST, ALT, ALKPHOS, BILITOT, PROT, ALBUMIN in the last 168 hours. No results for input(s): LIPASE, AMYLASE in the last 168 hours. No results for input(s): AMMONIA in the last 168 hours. CBC:  Recent Labs Lab 03/27/14 2141 03/28/14 0517  WBC 8.0 6.7  HGB 13.2 11.6*  HCT 38.4 33.7*  MCV 89.3 88.5  PLT 254 270   Cardiac Enzymes: No results for input(s): CKTOTAL, CKMB, CKMBINDEX, TROPONINI in the last 168 hours. BNP: Invalid input(s): POCBNP CBG: No results for input(s): GLUCAP in the last 168 hours.  No results found for this or any previous visit (from the past 240 hour(s)).   Scheduled Meds: . amLODipine  5 mg Oral Daily  . azithromycin  500 mg Intravenous QHS  . B-complex with vitamin C  1 tablet Oral Daily  . budesonide (PULMICORT) nebulizer solution  0.25 mg Nebulization BID  . buPROPion  150 mg Oral Daily  . cefTRIAXone (ROCEPHIN)  IV  1 g Intravenous QHS  . famotidine  20 mg Oral QHS  . isosorbide-hydrALAZINE  1 tablet Oral BID  . levothyroxine  25 mcg Oral QAC breakfast  . pantoprazole  40 mg Oral Q1200   Continuous Infusions: . sodium chloride 75 mL/hr at 03/28/14 [id number]

## 2014-03-28 NOTE — Progress Notes (Signed)
PT demonstrated verbal and hands on understanding of Flutter device. 

## 2014-03-29 ENCOUNTER — Inpatient Hospital Stay (HOSPITAL_COMMUNITY): Payer: Medicare Other

## 2014-03-29 LAB — BASIC METABOLIC PANEL
ANION GAP: 8 (ref 5–15)
BUN: 11 mg/dL (ref 6–23)
CALCIUM: 8 mg/dL — AB (ref 8.4–10.5)
CO2: 22 mmol/L (ref 19–32)
Chloride: 91 mmol/L — ABNORMAL LOW (ref 96–112)
Creatinine, Ser: 0.57 mg/dL (ref 0.50–1.10)
GFR calc Af Amer: >90 mL/min (ref >90)
GFR, EST NON AFRICAN AMERICAN: 79 mL/min — AB (ref >90)
Glucose, Bld: 88 mg/dL (ref 70–99)
POTASSIUM: 3.9 mmol/L (ref 3.5–5.1)
SODIUM: 121 mmol/L — AB (ref 135–145)

## 2014-03-29 LAB — HIV ANTIBODY (ROUTINE TESTING W REFLEX): HIV Screen 4th Generation wRfx: NONREACTIVE

## 2014-03-29 LAB — PROTIME-INR
INR: 1.5 — AB (ref 0.00–1.49)
Prothrombin Time: 18.2 seconds — ABNORMAL HIGH (ref 11.6–15.2)

## 2014-03-29 MED ORDER — DOCUSATE SODIUM 100 MG PO CAPS
100.0000 mg | ORAL_CAPSULE | Freq: Two times a day (BID) | ORAL | Status: DC
  Administered 2014-03-29 – 2014-04-01 (×6): 100 mg via ORAL

## 2014-03-29 MED ORDER — WARFARIN SODIUM 3 MG PO TABS
3.0000 mg | ORAL_TABLET | Freq: Once | ORAL | Status: AC
  Administered 2014-03-29: 3 mg via ORAL
  Filled 2014-03-29: qty 1

## 2014-03-29 MED ORDER — POLYETHYLENE GLYCOL 3350 17 G PO PACK
17.0000 g | PACK | Freq: Two times a day (BID) | ORAL | Status: DC
  Administered 2014-03-29 – 2014-04-01 (×5): 17 g via ORAL

## 2014-03-29 NOTE — Progress Notes (Addendum)
Patient ID: Candace Cain, female   DOB: Nov 29, 1920, 81 y.o.   MRN: [id number] [facility] PROGRESS NOTE  Candace Cain [id number] DOB: Nov 29, 1920 DOA: 03/27/2014 PCP:  Duane Lopeoss, Alan, MD  Brief narrative:     81 year old female with past medical history of hypertension, hypothyroidism, atrial fibrillation on anticoagulation with Coumadin, anxiety and depression who [facility] with complaints of worsening shortness of breath with associated cough productive of whitish sputum. Patient reports her symptoms started couple of days prior to this admission. She saw her primary care physician and was started on Z-Pak for possible pneumonia but she did not experience significant symptomatic relief. This was then changed to Levaquin but per patient her symptoms continued to get worse even while on Levaquin. On admission, patient was hemodynamically stable. Chest x-ray showed early infiltrate in the right lung base. Patient was started on empiric azithromycin and Rocephin.   Assessment/Plan:    Principal Problem:   Lobar pneumonia due to unspecified organism / community-acquired pneumonia - Continue azithromycin and Rocephin  - Influenza negative - strep pneumonia negative. Legionella is pending. - Resp culture pending. - Blood cultures so far negative. - Resp status stable.   Active Problems:   Essential hypertension - Continue Prinzide, Norvasc, Bidil    Atrial fibrillation - CHADS2 vasc score 5 - Rate controlled - Continue Coumadin, pharmacy dosing.    Hypothyroidism - Continue Synthroid      Depression - Continue Wellbutrin    Hyponatremia - Sodium level 122 on the admission.  - For some reason BMP not collected this am, order for statu BMP placed.     DVT Prophylaxis  - We'll place order for SCDs bilaterally   Code Status: DNR/DNI Family Communication:  plan of care discussed with the patient Disposition Plan: D/C in next 24-48 hours.  IV  access:  Peripheral IV  Procedures and diagnostic studies:    Dg Chest 2 View 03/27/2014  Early infiltrate in the right lung base posteriorly.     Dg Chest 2 View 03/26/2014   Possible bronchitis but no infiltrate or collapse.     Medical Consultants:  None  Other Consultants:  Physical therapy  IAnti-Infectives:   Azithromycin 03/28/2014 --> Rocephin 03/28/2014 -->   Manson PasseyEVINE, Aahna Rossa, MD  [facility] Pager [phone]  If 7PM-7AM, please contact night-coverage [email or web address] Password [email or web address] 03/29/2014, 4:07 PM   LOS: 2 days    HPI/Subjective: No acute overnight events.  Objective: Filed Vitals:   03/29/14 0541 03/29/14 0755 03/29/14 1206 03/29/14 1439  BP: 115/64  125/60 160/72  Pulse: 66  56 68  Temp: 98 F (36.7 C)   97.8 F (36.6 C)  TempSrc: Oral   Oral  Resp: 18   16  Height:      Weight:      SpO2: 94% 96%  100%    Intake/Output Summary (Last 24 hours) at 03/29/14 1607 Last data filed at 03/29/14 1440  Gross per 24 hour  Intake   1380 ml  Output   1550 ml  Net   -170 ml    Exam:   General:  Pt is not in acute distress  Cardiovascular: , S1/S2 (+), rate controlled  Respiratory: no wheezing, no crackles   Abdomen: non distended, (+) BS  Extremities: No LE swelling, pulses palpable  Neuro: nonfocal  Data Reviewed: Basic Metabolic Panel:  Recent Labs Lab 03/27/14 2141 03/28/14 0517 03/28/14 0530  NA 122*  --  123*  K 4.4  --  3.8  CL 88*  --  89*  CO2 25  --  25  GLUCOSE 114*  --  86  BUN 17  --  12  CREATININE 0.62  --  0.54  CALCIUM 9.0  --  8.2*  MG  --  1.5  --   PHOS  --  2.3  --    Liver Function Tests: No results for input(s): AST, ALT, ALKPHOS, BILITOT, PROT, ALBUMIN in the last 168 hours. No results for input(s): LIPASE, AMYLASE in the last 168 hours. No results for input(s): AMMONIA in the last 168 hours. CBC:  Recent Labs Lab 03/27/14 2141 03/28/14 0517  WBC 8.0 6.7  HGB 13.2 11.6*  HCT 38.4 33.7*  MCV  89.3 88.5  PLT 254 270   Cardiac Enzymes: No results for input(s): CKTOTAL, CKMB, CKMBINDEX, TROPONINI in the last 168 hours. BNP: Invalid input(s): POCBNP CBG: No results for input(s): GLUCAP in the last 168 hours.  Recent Results (from the past 240 hour(s))  Blood culture (routine x 2)     Status: None (Preliminary result)   Collection Time: 03/27/14  9:40 PM  Result Value Ref Range Status   Specimen Description BLOOD R FOREARM  Final   Special Requests BOTTLES DRAWN AEROBIC AND ANAEROBIC 5CC  Final   Culture   Final           BLOOD CULTURE RECEIVED NO GROWTH TO DATE CULTURE WILL BE HELD FOR 5 DAYS BEFORE ISSUING A FINAL NEGATIVE REPORT Performed at [facility]    Report Status PENDING  Incomplete  Blood culture (routine x 2)     Status: None (Preliminary result)   Collection Time: 03/27/14 10:08 PM  Result Value Ref Range Status   Specimen Description BLOOD LEFT ANTECUBITAL  Final   Special Requests BOTTLES DRAWN AEROBIC AND ANAEROBIC 5CC  Final   Culture   Final           BLOOD CULTURE RECEIVED NO GROWTH TO DATE CULTURE WILL BE HELD FOR 5 DAYS BEFORE ISSUING A FINAL NEGATIVE REPORT Performed at [facility]    Report Status PENDING  Incomplete  Culture, sputum-assessment     Status: None   Collection Time: 03/28/14 12:06 PM  Result Value Ref Range Status   Specimen Description SPUTUM  Final   Special Requests NONE  Final   Sputum evaluation   Final    THIS SPECIMEN IS ACCEPTABLE. RESPIRATORY CULTURE REPORT TO FOLLOW.   Report Status 03/28/2014 FINAL  Final  Culture, respiratory (NON-Expectorated)     Status: None (Preliminary result)   Collection Time: 03/28/14 12:06 PM  Result Value Ref Range Status   Specimen Description SPUTUM  Final   Special Requests NONE  Final   Gram Stain PENDING  Incomplete   Culture NO GROWTH Performed at [facility]   Final   Report Status PENDING  Incomplete     Scheduled Meds: . amLODipine  5 mg Oral Daily   . azithromycin  500 mg Intravenous QHS  . B-complex with vitamin C  1 tablet Oral Daily  . budesonide (PULMICORT) nebulizer solution  0.25 mg Nebulization BID  . buPROPion  150 mg Oral Daily  . cefTRIAXone (ROCEPHIN)  IV  1 g Intravenous QHS  . docusate sodium  100 mg Oral BID  . famotidine  20 mg Oral QHS  . lisinopril  20 mg Oral Daily   And  . hydrochlorothiazide  12.5 mg Oral Daily  . isosorbide-hydrALAZINE  1 tablet Oral  BID  . levothyroxine  25 mcg Oral QAC breakfast  . pantoprazole  40 mg Oral Q1200  . polyethylene glycol  17 g Oral BID  . warfarin  3 mg Oral ONCE-1800  . Warfarin - Pharmacist Dosing Inpatient   Does not apply q1800   Continuous Infusions: . sodium chloride 75 mL/hr at 03/28/14 2014

## 2014-03-29 NOTE — Progress Notes (Signed)
ANTICOAGULATION CONSULT NOTE -   Pharmacy Consult for warfarin Indication: paroxysmal atrial fibrillation  No Known Allergies  Patient Measurements: Height: 5\' 2"  (157.5 cm) Weight: 116 lb (52.617 kg) IBW/kg (Calculated) : 50.1  Vital Signs: Temp: 98 F (36.7 C) (03/19 0541) Temp Source: Oral (03/19 0541) BP: 115/64 mmHg (03/19 0541) Pulse Rate: 66 (03/19 0541)  Labs:  Recent Labs  03/27/14 2141 03/28/14 0517 03/28/14 0530 03/28/14 1125 03/29/14 0539  HGB 13.2 11.6*  --   --   --   HCT 38.4 33.7*  --   --   --   PLT 254 270  --   --   --   LABPROT  --   --   --  18.1* 18.2*  INR  --   --   --  1.49 1.50*  CREATININE 0.62  --  0.54  --   --     Estimated Creatinine Clearance: 36.2 mL/min (by C-G formula based on Cr of 0.54).   Medical History: Past Medical History  Diagnosis Date  . Bradycardia   . Alopecia   . Unspecified essential hypertension   . Atrial fibrillation     Medications:  Scheduled:  . amLODipine  5 mg Oral Daily  . azithromycin  500 mg Intravenous QHS  . B-complex with vitamin C  1 tablet Oral Daily  . budesonide (PULMICORT) nebulizer solution  0.25 mg Nebulization BID  . buPROPion  150 mg Oral Daily  . cefTRIAXone (ROCEPHIN)  IV  1 g Intravenous QHS  . famotidine  20 mg Oral QHS  . lisinopril  20 mg Oral Daily   And  . hydrochlorothiazide  12.5 mg Oral Daily  . isosorbide-hydrALAZINE  1 tablet Oral BID  . levothyroxine  25 mcg Oral QAC breakfast  . pantoprazole  40 mg Oral Q1200  . Warfarin - Pharmacist Dosing Inpatient   Does not apply q1800   Infusions:  . sodium chloride 75 mL/hr at 03/28/14 2014   PRN: alum & mag hydroxide-simeth, chlorpheniramine-HYDROcodone, ipratropium-albuterol, nitroGLYCERIN, ondansetron (ZOFRAN) IV  Assessment: 81 y/o on chronic warfarin for PAF, admitted through ED 03/27/14 with CAP. There was reportedly some associated mild transient hemoptysis.  PTA warfarin dosage reported as 2 mg M,W,F and 1mg  Tues,  Thurs, Sat, Sun.  Last dose was taken on 3/17.  INR on admission, 1.49, is subtherapeutic.  Pharmacy is consulted to resume warfarin dosing while inpatient.  Today, 03/29/2014:  INR 1.50 (subtherapeutic)  CBC: Hgb 11.6, Plt 270  No bleeding or complications reported.  Diet: heart-healthy, carb-modified  Drug-drug interaction: antibiotics may increase INR.  Goal of Therapy:  INR 2-3   Plan:   Warfarin 3mg  PO today at 1800 x1  Daily INR   Arley Phenixllen Shamira Toutant RPh 03/29/2014, 10:21 AM Pager [phone]

## 2014-03-29 NOTE — Evaluation (Signed)
Occupational Therapy Evaluation Patient Details Name: Candace Cain MRN: [id number] DOB: 07-27-1920 Today's Date: 03/29/2014    History of Present Illness 81 yo female admitted 03/27/14 with cough, SOB, blood tinged sputum and general malaise R/O pneumonia.H/O afib.   Clinical Impression   Pt admitted with cough . Pt currently with functional limitations due to the deficits listed below (see OT Problem List).  Pt will benefit from skilled OT to increase their safety and independence with ADL and functional mobility for ADL to facilitate discharge to venue listed below.      Follow Up Recommendations  SNF;Supervision/Assistance - 24 hour    Equipment Recommendations  None recommended by OT       Precautions / Restrictions Precautions Precautions: Fall      Mobility Bed Mobility Overal bed mobility: Independent                Transfers Overall transfer level: Needs assistance Equipment used: 1 person hand held assist Transfers: Sit to/from Stand Sit to Stand: Min assist              Balance Overall balance assessment: Needs assistance Sitting-balance support: Single extremity supported Sitting balance-Leahy Scale: Normal     Standing balance support: Single extremity supported;During functional activity Standing balance-Leahy Scale: Fair                              ADL Overall ADL's : Needs assistance/impaired Eating/Feeding: Set up;Sitting   Grooming: Oral care;Wash/dry face;Standing;Minimal assistance   Upper Body Bathing: Set up;Sitting   Lower Body Bathing: Minimal assistance;Sit to/from stand   Upper Body Dressing : Set up;Sitting   Lower Body Dressing: Minimal assistance;Sit to/from stand   Toilet Transfer: Minimal assistance;Comfort height toilet;Ambulation   Toileting- Clothing Manipulation and Hygiene: Minimal assistance;Sit to/from stand         General ADL Comments: pt HOH.  Pt had several instances of LOB in which she  was able to self correct with min A from OT.     Vision     Perception     Praxis      Pertinent Vitals/Pain Pain Score: 3  Pain Location: stomach- pt stated she needed to have a BM Pain Intervention(s): Monitored during session;Repositioned        Extremity/Trunk Assessment Upper Extremity Assessment Upper Extremity Assessment: Generalized weakness           Communication Communication Communication: No difficulties   Cognition   Behavior During Therapy: WFL for tasks assessed/performed;Flat affect Overall Cognitive Status: Within Functional Limits for tasks assessed (pt HOH)                     General Comments   due to limited A at home- pt will likely need SNF            Home Living Family/patient expects to be discharged to:: Private residence Living Arrangements: Alone Available Help at Discharge: Family Type of Home: Apartment       Home Layout: One level               Home Equipment: None   Additional Comments: spouse is in skilled      Prior Functioning/Environment Level of Independence: Independent             OT Diagnosis: Generalized weakness   OT Problem List: Decreased strength;Impaired balance (sitting and/or standing)   OT Treatment/Interventions: Self-care/ADL training;Patient/family education  OT Goals(Current goals can be found in the care plan section) Acute Rehab OT Goals Patient Stated Goal: return home OT Goal Formulation: With patient Time For Goal Achievement: 04/12/14 Potential to Achieve Goals: Good  OT Frequency: Min 2X/week   Barriers to D/C: Decreased caregiver support             End of Session Nurse Communication: Mobility status  Activity Tolerance: Patient tolerated treatment well Patient left: Other (comment) (with CNA in bathroom)   Time: 1610-9604 OT Time Calculation (min): 12 min Charges:  OT General Charges $OT Visit: 1 Procedure OT Evaluation $Initial OT Evaluation Tier  I: 1 Procedure G-Codes:    Einar Crow D 04/10/14, 10:03 AM

## 2014-03-30 LAB — BASIC METABOLIC PANEL
ANION GAP: 10 (ref 5–15)
BUN: 10 mg/dL (ref 6–23)
CO2: 23 mmol/L (ref 19–32)
CREATININE: 0.49 mg/dL — AB (ref 0.50–1.10)
Calcium: 7.8 mg/dL — ABNORMAL LOW (ref 8.4–10.5)
Chloride: 88 mmol/L — ABNORMAL LOW (ref 96–112)
GFR, EST NON AFRICAN AMERICAN: 83 mL/min — AB (ref >90)
GLUCOSE: 95 mg/dL (ref 70–99)
POTASSIUM: 3.1 mmol/L — AB (ref 3.5–5.1)
Sodium: 121 mmol/L — ABNORMAL LOW (ref 135–145)

## 2014-03-30 LAB — CBC
HCT: 32.9 % — ABNORMAL LOW (ref 36.0–46.0)
Hemoglobin: 11.4 g/dL — ABNORMAL LOW (ref 12.0–15.0)
MCH: 30.6 pg (ref 26.0–34.0)
MCHC: 34.7 g/dL (ref 30.0–36.0)
MCV: 88.2 fL (ref 78.0–100.0)
Platelets: 299 10*3/uL (ref 150–400)
RBC: 3.73 MIL/uL — ABNORMAL LOW (ref 3.87–5.11)
RDW: 12.6 % (ref 11.5–15.5)
WBC: 8.2 10*3/uL (ref 4.0–10.5)

## 2014-03-30 LAB — CULTURE, RESPIRATORY W GRAM STAIN: Culture: NORMAL

## 2014-03-30 LAB — SODIUM, URINE, RANDOM: Sodium, Ur: 119 mmol/L

## 2014-03-30 LAB — PROTIME-INR
INR: 1.76 — ABNORMAL HIGH (ref 0.00–1.49)
Prothrombin Time: 20.6 seconds — ABNORMAL HIGH (ref 11.6–15.2)

## 2014-03-30 LAB — CULTURE, RESPIRATORY

## 2014-03-30 MED ORDER — WARFARIN SODIUM 2 MG PO TABS
2.0000 mg | ORAL_TABLET | Freq: Once | ORAL | Status: AC
  Administered 2014-03-30: 2 mg via ORAL
  Filled 2014-03-30 (×2): qty 1

## 2014-03-30 MED ORDER — BISACODYL 10 MG RE SUPP
10.0000 mg | Freq: Once | RECTAL | Status: AC
  Administered 2014-03-30: 10 mg via RECTAL
  Filled 2014-03-30: qty 1

## 2014-03-30 MED ORDER — POTASSIUM CHLORIDE CRYS ER 20 MEQ PO TBCR
40.0000 meq | EXTENDED_RELEASE_TABLET | Freq: Once | ORAL | Status: AC
  Administered 2014-03-30: 40 meq via ORAL
  Filled 2014-03-30: qty 2

## 2014-03-30 NOTE — Progress Notes (Addendum)
Patient ID: Candace Cain Candace Cain, female   DOB: Dec 05, 1920, 81 y.o.   MRN: [id number] [facility] PROGRESS NOTE  Candace Cain Boye [id number] DOB: Dec 05, 1920 DOA: 03/27/2014 PCP:  Duane Lopeoss, Alan, MD  Brief narrative:     81 year old female with past medical history of hypertension, hypothyroidism, atrial fibrillation on anticoagulation with Coumadin, anxiety and depression who [facility] with complaints of worsening shortness of breath with associated cough productive of whitish sputum. Patient reports her symptoms started couple of days prior to this admission. She saw her primary care physician and was started on Z-Pak for possible pneumonia but she did not experience significant symptomatic relief. This was then changed to Levaquin but per patient her symptoms continued to get worse even while on Levaquin. On admission, patient was hemodynamically stable. Chest x-ray showed early infiltrate in the right lung base. Patient was started on empiric azithromycin and Rocephin.   Assessment/Plan:    Principal Problem:   Lobar pneumonia due to unspecified organism / community-acquired pneumonian  - Influenza negative. Empirically started on azithromycin and rocephin,  - Strep pneumonia negative. Legionella is pending. - Resp culture - normal oropharyngeal flora. - Blood cultures so far negative.  Active Problems:   Essential hypertension - Continue Prinzide, Norvasc, Bidil - Stable BP    Atrial fibrillation - CHADS2 vasc score 5 - Continue Coumadin - INR 1.76 today     Hypothyroidism - Continue Synthroid      Depression - Continue Wellbutrin    Hyponatremia - Sodium level 121 - D/C Hctz which may be contributing to hyponatremia - Check urine sodium, urine osm and serum osm - TSH WNL   DVT Prophylaxis  - We'll place order for SCDs bilaterally   Code Status: DNR/DNI Family Communication:  plan of care discussed with the patient Disposition Plan: D/C in next 24  hours.   IV access:  Peripheral IV  Procedures and diagnostic studies:    Dg Chest 2 View 03/27/2014  Early infiltrate in the right lung base posteriorly.     Dg Chest 2 View 03/26/2014   Possible bronchitis but no infiltrate or collapse.     Medical Consultants:  None  Other Consultants:  Physical therapy  IAnti-Infectives:   Azithromycin 03/28/2014 --> Rocephin 03/28/2014 -->   Manson PasseyEVINE, Jazmynn Pho, MD  [facility] Pager [phone]  If 7PM-7AM, please contact night-coverage [email or web address] Password [email or web address] 03/30/2014, 2:44 PM   LOS: 3 days    HPI/Subjective: No acute overnight events.  Objective: Filed Vitals:   03/29/14 2304 03/30/14 0512 03/30/14 0805 03/30/14 1408  BP: 121/62 119/64  96/42  Pulse: 83 76  71  Temp: 97.9 F (36.6 C) 98 F (36.7 C)  97.6 F (36.4 C)  TempSrc: Oral Oral  Oral  Resp: 20 16  16   Height:      Weight:      SpO2: 98% 96% 95% 95%    Intake/Output Summary (Last 24 hours) at 03/30/14 1444 Last data filed at 03/30/14 1301  Gross per 24 hour  Intake   2700 ml  Output    900 ml  Net   1800 ml    Exam:   General:  Pt is awake, alert  Cardiovascular: RRR, S1/S2 (+)  Respiratory:bilateral air entry, no wheezing   Abdomen: non tender and non distended, (+) BS  Extremities: No edema, pulses palpable  Neuro: oriented to time, place and person   Data Reviewed: Basic Metabolic Panel:  Recent Labs Lab 03/27/14 2141 03/28/14 0517 03/28/14 0530 03/29/14  1659 03/30/14 0459  NA 122*  --  123* 121* 121*  K 4.4  --  3.8 3.9 3.1*  CL 88*  --  89* 91* 88*  CO2 25  --  GLUCOSE 114*  --  86 88 95  BUN 17  --  CREATININE 0.62  --  0.54 0.57 0.49*  CALCIUM 9.0  --  8.2* 8.0* 7.8*  MG  --  1.5  --   --   --   PHOS  --  2.3  --   --   --    Liver Function Tests: No results for input(s): AST, ALT, ALKPHOS, BILITOT, PROT, ALBUMIN in the last 168 hours. No results for input(s): LIPASE, AMYLASE in the last 168  hours. No results for input(s): AMMONIA in the last 168 hours. CBC:  Recent Labs Lab 03/27/14 2141 03/28/14 0517 03/30/14 0459  WBC 8.0 6.7 8.2  HGB 13.2 11.6* 11.4*  HCT 38.4 33.7* 32.9*  MCV 89.3 88.5 88.2  PLT 254 270 299   Cardiac Enzymes: No results for input(s): CKTOTAL, CKMB, CKMBINDEX, TROPONINI in the last 168 hours. BNP: Invalid input(s): POCBNP CBG: No results for input(s): GLUCAP in the last 168 hours.  Recent Results (from the past 240 hour(s))  Blood culture (routine x 2)     Status: None (Preliminary result)   Collection Time: 03/27/14  9:40 PM  Result Value Ref Range Status   Specimen Description BLOOD R FOREARM  Final   Special Requests BOTTLES DRAWN AEROBIC AND ANAEROBIC 5CC  Final   Culture   Final           BLOOD CULTURE RECEIVED NO GROWTH TO DATE CULTURE WILL BE HELD FOR 5 DAYS BEFORE ISSUING A FINAL NEGATIVE REPORT Performed at [facility]    Report Status PENDING  Incomplete  Blood culture (routine x 2)     Status: None (Preliminary result)   Collection Time: 03/27/14 10:08 PM  Result Value Ref Range Status   Specimen Description BLOOD LEFT ANTECUBITAL  Final   Special Requests BOTTLES DRAWN AEROBIC AND ANAEROBIC 5CC  Final   Culture   Final           BLOOD CULTURE RECEIVED NO GROWTH TO DATE CULTURE WILL BE HELD FOR 5 DAYS BEFORE ISSUING A FINAL NEGATIVE REPORT Performed at [facility]    Report Status PENDING  Incomplete  Culture, sputum-assessment     Status: None   Collection Time: 03/28/14 12:06 PM  Result Value Ref Range Status   Specimen Description SPUTUM  Final   Special Requests NONE  Final   Sputum evaluation   Final    THIS SPECIMEN IS ACCEPTABLE. RESPIRATORY CULTURE REPORT TO FOLLOW.   Report Status 03/28/2014 FINAL  Final  Culture, respiratory (NON-Expectorated)     Status: None (Preliminary result)   Collection Time: 03/28/14 12:06 PM  Result Value Ref Range Status   Specimen Description SPUTUM  Final    Special Requests NONE  Final   Gram Stain   Final    RARE WBC PRESENT, PREDOMINANTLY MONONUCLEAR NO SQUAMOUS EPITHELIAL CELLS SEEN RARE GRAM NEGATIVE RODS RARE GRAM POSITIVE COCCI IN PAIRS    Culture   Final    NORMAL OROPHARYNGEAL FLORA Performed at [facility]    Report Status PENDING  Incomplete     Scheduled Meds: . amLODipine  5 mg Oral Daily  . azithromycin  500 mg Intravenous QHS  .  B-complex with vitamin C  1 tablet Oral Daily  . budesonide (PULMICORT) nebulizer solution  0.25 mg Nebulization BID  . buPROPion  150 mg Oral Daily  . cefTRIAXone (ROCEPHIN)  IV  1 g Intravenous QHS  . docusate sodium  100 mg Oral BID  . famotidine  20 mg Oral QHS  . lisinopril  20 mg Oral Daily   And  . hydrochlorothiazide  12.5 mg Oral Daily  . isosorbide-hydrALAZINE  1 tablet Oral BID  . levothyroxine  25 mcg Oral QAC breakfast  . pantoprazole  40 mg Oral Q1200  . polyethylene glycol  17 g Oral BID  . warfarin  2 mg Oral ONCE-1800  . Warfarin - Pharmacist Dosing Inpatient   Does not apply q1800   Continuous Infusions: . sodium chloride 75 mL/hr at 03/29/14 2109

## 2014-03-30 NOTE — Clinical Social Work Note (Signed)
CSW reviewed pt chart which reflected a PT recommendation of "HH/PT -SNF no family present" so CSW called and left message for pt's niece who was only family listed on facesheet  CSW received a message from pt's daughter left on CSW voicemail  (could not understand her name) think she said [name] and she left her number [phone].  Pt's daughter asked that she be put on pt's emergency contact as well as pt's niece and left a message stating that She (the daughter) was going to be going to stay with pt when she was discharged to care for her so pt would not need any SNF placement.  She stated if there were any questions to please call her at the above number  .Candace Bubaegina Candace Hanselman, LCSW [facility] Clinical Social Worker - Weekend Coverage cell #: [phone]

## 2014-03-30 NOTE — Progress Notes (Signed)
ANTICOAGULATION CONSULT NOTE -   Pharmacy Consult for warfarin Indication: paroxysmal atrial fibrillation  No Known Allergies  Patient Measurements: Height: 5\' 2"  (157.5 cm) Weight: 116 lb (52.617 kg) IBW/kg (Calculated) : 50.1  Vital Signs: Temp: 98 F (36.7 C) (03/20 0512) Temp Source: Oral (03/20 0512) BP: 119/64 mmHg (03/20 0512) Pulse Rate: 76 (03/20 0512)  Labs:  Recent Labs  03/27/14 2141 03/28/14 0517 03/28/14 0530 03/28/14 1125 03/29/14 0539 03/29/14 1659 03/30/14 0459  HGB 13.2 11.6*  --   --   --   --  11.4*  HCT 38.4 33.7*  --   --   --   --  32.9*  PLT 254 270  --   --   --   --  299  LABPROT  --   --   --  18.1* 18.2*  --  20.6*  INR  --   --   --  1.49 1.50*  --  1.76*  CREATININE 0.62  --  0.54  --   --  0.57 0.49*    Estimated Creatinine Clearance: 36.2 mL/min (by C-G formula based on Cr of 0.49).   Medical History: Past Medical History  Diagnosis Date  . Bradycardia   . Alopecia   . Unspecified essential hypertension   . Atrial fibrillation     Medications:  Scheduled:  . amLODipine  5 mg Oral Daily  . azithromycin  500 mg Intravenous QHS  . B-complex with vitamin C  1 tablet Oral Daily  . budesonide (PULMICORT) nebulizer solution  0.25 mg Nebulization BID  . buPROPion  150 mg Oral Daily  . cefTRIAXone (ROCEPHIN)  IV  1 g Intravenous QHS  . docusate sodium  100 mg Oral BID  . famotidine  20 mg Oral QHS  . lisinopril  20 mg Oral Daily   And  . hydrochlorothiazide  12.5 mg Oral Daily  . isosorbide-hydrALAZINE  1 tablet Oral BID  . levothyroxine  25 mcg Oral QAC breakfast  . pantoprazole  40 mg Oral Q1200  . polyethylene glycol  17 g Oral BID  . warfarin  2 mg Oral ONCE-1800  . Warfarin - Pharmacist Dosing Inpatient   Does not apply q1800   Infusions:  . sodium chloride 75 mL/hr at 03/29/14 2109   PRN: alum & mag hydroxide-simeth, chlorpheniramine-HYDROcodone, ipratropium-albuterol, nitroGLYCERIN, ondansetron (ZOFRAN)  IV  Assessment: Candace Cain on chronic warfarin for PAF, admitted through ED 03/27/14 with CAP. There was reportedly some associated mild transient hemoptysis.  PTA warfarin dosage reported as 2 mg M,W,F and 1mg  Tues, Thurs, Sat, Sun.  Last dose was taken on 3/17.  INR on admission, 1.49, is subtherapeutic.  Pharmacy is consulted to resume warfarin dosing while inpatient.  Today, 03/30/2014:  INR 1.76 (subtherapeutic, but trending up)  CBC: Hgb 11.4, Pltc 299  No bleeding or complications reported.  Diet: heart-healthy, carb-modified, eating 25-100% of meals  Drug-drug interaction: antibiotics may increase INR.  Goal of Therapy:  INR 2-3   Plan:   Warfarin 2 mg PO x 1 today at 1800  Daily PT/INR  Monitor for signs/symptoms of bleeding   Greer PickerelJigna Tomi Paddock, PharmD, BCPS Pager: [phone] 03/30/2014 10:07 AM

## 2014-03-31 LAB — BASIC METABOLIC PANEL
Anion gap: 10 (ref 5–15)
BUN: 8 mg/dL (ref 6–23)
CO2: 21 mmol/L (ref 19–32)
Calcium: 8.4 mg/dL (ref 8.4–10.5)
Chloride: 90 mmol/L — ABNORMAL LOW (ref 96–112)
Creatinine, Ser: 0.62 mg/dL (ref 0.50–1.10)
GFR calc Af Amer: 89 mL/min — ABNORMAL LOW (ref >90)
GFR calc non Af Amer: 76 mL/min — ABNORMAL LOW (ref >90)
Glucose, Bld: 86 mg/dL (ref 70–99)
Potassium: 3.4 mmol/L — ABNORMAL LOW (ref 3.5–5.1)
SODIUM: 121 mmol/L — AB (ref 135–145)

## 2014-03-31 LAB — LEGIONELLA ANTIGEN, URINE

## 2014-03-31 LAB — PROTIME-INR
INR: 2.12 — ABNORMAL HIGH (ref 0.00–1.49)
Prothrombin Time: 23.9 seconds — ABNORMAL HIGH (ref 11.6–15.2)

## 2014-03-31 LAB — OSMOLALITY: Osmolality: 253 mOsm/kg — ABNORMAL LOW (ref 275–300)

## 2014-03-31 LAB — OSMOLALITY, URINE: Osmolality, Ur: 550 mOsm/kg (ref 390–1090)

## 2014-03-31 MED ORDER — POTASSIUM CHLORIDE CRYS ER 20 MEQ PO TBCR
40.0000 meq | EXTENDED_RELEASE_TABLET | Freq: Once | ORAL | Status: AC
  Administered 2014-03-31: 40 meq via ORAL
  Filled 2014-03-31: qty 2

## 2014-03-31 MED ORDER — WARFARIN SODIUM 2 MG PO TABS
2.0000 mg | ORAL_TABLET | Freq: Once | ORAL | Status: AC
  Administered 2014-03-31: 2 mg via ORAL
  Filled 2014-03-31: qty 1

## 2014-03-31 NOTE — Progress Notes (Signed)
Physical Therapy Treatment Patient Details Name: Candace Cain MRN: [id number] DOB: 1920-03-26 Today's Date: 03/31/2014    History of Present Illness 81 yo female admitted 03/27/14 with cough, SOB, blood tinged sputum and general malaise R/O pneumonia.H/O afib.    PT Comments    Pt feeling better.  Mild cough.  RA 96%.  Amb in hallway a great distance without any AD.  Pt feels she is at prior level of mobility and does NOT want HH PT.  Follow Up Recommendations  No PT follow up (pt does not wish to have a therapist come to her home)  will update/consult LPT   Equipment Recommendations  None recommended by PT    Recommendations for Other Services       Precautions / Restrictions Precautions Precautions: Fall Restrictions Weight Bearing Restrictions: No    Mobility  Bed Mobility Overal bed mobility: Modified Independent             General bed mobility comments: increased time  Transfers Overall transfer level: Needs assistance Equipment used: None Transfers: Sit to/from Stand Sit to Stand: Supervision         General transfer comment: good safety cognition and use of hands to steady self  Ambulation/Gait Ambulation/Gait assistance: Supervision Ambulation Distance (Feet): 285 Feet Assistive device: None Gait Pattern/deviations: Step-through pattern Gait velocity: WNL   General Gait Details: good alternating gait with no LOB and no SOB.  Mild cough.  Tolerated well.  Amb a great distance.   Stairs            Wheelchair Mobility    Modified Rankin (Stroke Patients Only)       Balance Overall balance assessment: Needs assistance Sitting-balance support: Single extremity supported                                Cognition Arousal/Alertness: Awake/alert Behavior During Therapy: WFL for tasks assessed/performed Overall Cognitive Status: Within Functional Limits for tasks assessed                      Exercises       General Comments        Pertinent Vitals/Pain Pain Assessment: No/denies pain    Home Living                      Prior Function            PT Goals (current goals can now be found in the care plan section) Acute Rehab PT Goals Patient Stated Goal: return home Progress towards PT goals: Progressing toward goals    Frequency  Min 3X/week    PT Plan      Co-evaluation             End of Session Equipment Utilized During Treatment: Gait belt Activity Tolerance: Patient tolerated treatment well Patient left: in bed;with call bell/phone within reach     Time: 1436-1453 PT Time Calculation (min) (ACUTE ONLY): 17 min  Charges:  $Gait Training: 8-22 mins                    G Codes:      Felecia ShellingLori Elowyn Raupp  PTA [facility]  Acute  Rehab Pager      [phone]

## 2014-03-31 NOTE — Progress Notes (Signed)
ANTICOAGULATION CONSULT NOTE   Pharmacy Consult for warfarin Indication: paroxysmal atrial fibrillation  No Known Allergies  Patient Measurements: Height: 5\' 2"  (157.5 cm) Weight: 116 lb (52.617 kg) IBW/kg (Calculated) : 50.1  Vital Signs: Temp: 98.2 F (36.8 C) (03/21 0603) Temp Source: Oral (03/21 0603) BP: 126/59 mmHg (03/21 0603) Pulse Rate: 77 (03/21 0603)  Labs:  Recent Labs  03/29/14 0539 03/29/14 1659 03/30/14 0459 03/31/14 0455  HGB  --   --  11.4*  --   HCT  --   --  32.9*  --   PLT  --   --  299  --   LABPROT 18.2*  --  20.6* 23.9*  INR 1.50*  --  1.76* 2.12*  CREATININE  --  0.57 0.49* 0.62    Estimated Creatinine Clearance: 36.2 mL/min (by C-G formula based on Cr of 0.62).   Medical History: Past Medical History  Diagnosis Date  . Bradycardia   . Alopecia   . Unspecified essential hypertension   . Atrial fibrillation     Medications:  Scheduled:  . amLODipine  5 mg Oral Daily  . azithromycin  500 mg Intravenous QHS  . B-complex with vitamin C  1 tablet Oral Daily  . budesonide (PULMICORT) nebulizer solution  0.25 mg Nebulization BID  . buPROPion  150 mg Oral Daily  . cefTRIAXone (ROCEPHIN)  IV  1 g Intravenous QHS  . docusate sodium  100 mg Oral BID  . famotidine  20 mg Oral QHS  . isosorbide-hydrALAZINE  1 tablet Oral BID  . levothyroxine  25 mcg Oral QAC breakfast  . lisinopril  20 mg Oral Daily  . pantoprazole  40 mg Oral Q1200  . polyethylene glycol  17 g Oral BID  . Warfarin - Pharmacist Dosing Inpatient   Does not apply q1800   Infusions:  . sodium chloride 75 mL/hr at 03/30/14 2207   PRN: alum & mag hydroxide-simeth, chlorpheniramine-HYDROcodone, ipratropium-albuterol, nitroGLYCERIN, ondansetron (ZOFRAN) IV  Assessment: 81 y/o on chronic warfarin for PAF, admitted through ED 03/27/14 with CAP. There was reportedly some associated mild transient hemoptysis.  PTA warfarin dosage reported as 2 mg M,W,F and 1mg  Tues, Thurs, Sat,  Sun.  Last dose was taken on 3/17.  INR on admission, 1.49, is subtherapeutic.  Pharmacy is consulted to resume warfarin dosing while inpatient.  Today, 03/31/2014:  INR therapeutic after warfarin booster doses (see MAR)  CBC (3/20): Hgb and Hct slightly low but stable; Pltc WNL  No bleeding or complications reported.  Diet: heart-healthy, carb-modified, eating 75-100% of meals  Drug-drug interaction: antibiotics may increase INR.  Goal of Therapy:  INR 2-3   Plan:   Warfarin 2 mg PO x 1 today as per patient's usual regimen  Daily PT/INR while inpatient  Monitor for signs/symptoms of bleeding, follow clinical course.   Elie Goodyandy Lareta Bruneau, PharmD, BCPS Pager: [phone] 03/31/2014  7:44 AM

## 2014-03-31 NOTE — Progress Notes (Signed)
Occupational Therapy Treatment Patient Details Name: Candace Cain MRN: [id number] DOB: 1920/07/21 Today's Date: 03/31/2014    History of present illness 81 yo female admitted 03/27/14 with cough, SOB, blood tinged sputum and general malaise R/O pneumonia.H/O afib.   OT comments  Pt states daugthers will A her at home [facility]  Follow Up Recommendations  Home health OT    Equipment Recommendations  None recommended by OT    Recommendations for Other Services      Precautions / Restrictions         Mobility Bed Mobility Overal bed mobility: Independent                Transfers Overall transfer level: Needs assistance Equipment used: 1 person hand held assist Transfers: Sit to/from Stand Sit to Stand: Supervision              Balance Overall balance assessment: Needs assistance Sitting-balance support: Single extremity supported                               ADL                       Lower Body Dressing: Minimal assistance;Sit to/from stand   Toilet Transfer: Minimal assistance;Comfort height toilet;Ambulation   Toileting- Clothing Manipulation and Hygiene: Minimal assistance;Sit to/from stand         General ADL Comments: pt felt she could walk to BR alone but was unsteady several times. OT pointed this out and pt agreed.  Daugther will A pt at home per pt- so pt will not need SNF      Vision                            Cognition   Behavior During Therapy: [facility] for tasks assessed/performed Overall Cognitive Status: Within Functional Limits for tasks assessed                                    Pertinent Vitals/ Pain       Pain Assessment: No/denies pain     Prior Functioning/Environment              Frequency Min 2X/week     Progress Toward Goals  OT Goals(current goals can now be found in the care plan section)  Progress towards OT goals: Progressing toward  goals  Acute Rehab OT Goals Patient Stated Goal: return home  Plan Discharge plan needs to be updated    Co-evaluation                 End of Session     Activity Tolerance Patient tolerated treatment well   Patient Left     Nurse Communication Mobility status        Time: 1359-1420 OT Time Calculation (min): 21 min  Charges: OT General Charges $OT Visit: 1 Procedure OT Treatments $Self Care/Home Management : 8-22 mins  Candace Cain, Karin GoldenLorraine D 03/31/2014, 2:40 PM

## 2014-03-31 NOTE — Progress Notes (Addendum)
Patient ID: Candace Fowlerlva Warren, female   DOB: 12-01-1920, 81 y.o.   MRN: [id number] [facility] PROGRESS NOTE  Candace Fowlerlva Dahm WUJ:[id number] DOB: 12-01-1920 DOA: 03/27/2014 PCP:  Duane Lopeoss, Alan, MD  Brief narrative:     81 year old female with past medical history of hypertension, hypothyroidism, atrial fibrillation on anticoagulation with Coumadin, anxiety and depression who [facility] with complaints of worsening shortness of breath with associated cough productive of whitish sputum. Patient reports her symptoms started couple of days prior to this admission. She saw her primary care physician and was started on Z-Pak for possible pneumonia but she did not experience significant symptomatic relief. This was then changed to Levaquin but per patient her symptoms continued to get worse even while on Levaquin. On admission, patient was hemodynamically stable. Chest x-ray showed early infiltrate in the right lung base. Patient was started on empiric azithromycin and Rocephin. The barrier to discharge, ongoing hyponatremia. We have stopped diuretic and we'll see if there is any change in sodium improvement. Will also check cortisol level.   Assessment/Plan:    Principal Problem:   Lobar pneumonia due to unspecified organism / community-acquired pneumonian  - Patient started empirically on azithromycin and Rocephin. Influenza panel is negative. - Strep pneumonia negative. Legionella is pending. - Blood cultures to date are negative. Respiratory culture growing normal oropharyngeal flora. - Respiratory status stable.  Active Problems:   Essential hypertension - Continue Norvasc, Bidil    Atrial fibrillation - CHADS2 vasc score 5 - Continue Coumadin - INR 1.76 today     Hypothyroidism - Continue Synthroid. TSH is within normal limits.      Depression - Continue Wellbutrin    Hyponatremia - Sodium level 121 over past 48 hours. We stopped HCTZ which we thought could  contribute to hyponatremia. No significant improvement in sodium level however. - Based on urine sodium and serum osmolarity this looks like hyperosmolar hyponatremia. TSH is within normal limits. Will check cortisol level. May need low dose sodium tablets.   DVT Prophylaxis  - SCDs bilaterally   Code Status: DNR/DNI Family Communication:  plan of care discussed with the patient Disposition Plan: Patient with ongoing hyponatremia. Monitor for next 24 hours.  IV access:  Peripheral IV  Procedures and diagnostic studies:    Dg Chest 2 View 03/27/2014  Early infiltrate in the right lung base posteriorly.     Dg Chest 2 View 03/26/2014   Possible bronchitis but no infiltrate or collapse.     Medical Consultants:  None  Other Consultants:  Physical therapy  IAnti-Infectives:   Azithromycin 03/28/2014 --> Rocephin 03/28/2014 -->   Manson PasseyEVINE, An Lannan, MD  [facility] Pager [phone]  If 7PM-7AM, please contact night-coverage [email or web address] Password [email or web address] 03/31/2014, 11:25 AM   LOS: 4 days    HPI/Subjective: No acute overnight events.  Objective: Filed Vitals:   03/30/14 2102 03/31/14 0603 03/31/14 0759 03/31/14 0921  BP:  126/59  113/58  Pulse:  77  67  Temp:  98.2 F (36.8 C)    TempSrc:  Oral    Resp:  16    Height:      Weight:      SpO2: 96% 96% 94% 96%    Intake/Output Summary (Last 24 hours) at 03/31/14 1125 Last data filed at 03/31/14 [id number]  Gross per 24 hour  Intake 3027.5 ml  Output    600 ml  Net 2427.5 ml    Exam:   General:  Pt is not in acute distress  Cardiovascular: RRR, S1/S2 (+)  Respiratory: Clear to auscultation bilaterally, no wheezing  Abdomen: No distention, no tenderness to palpation, appreciate bowel sounds  Extremities: No lower extremity swelling, palpable pulses.  Neuro: No focal deficits  Data Reviewed: Basic Metabolic Panel:  Recent Labs Lab 03/27/14 2141 03/28/14 0517 03/28/14 0530 03/29/14 1659 03/30/14 0459  03/31/14 0455  NA 122*  --  123* 121* 121* 121*  K 4.4  --  3.8 3.9 3.1* 3.4*  CL 88*  --  89* 91* 88* 90*  CO2 25  --  GLUCOSE 114*  --  86 88 95 86  BUN 17  --  CREATININE 0.62  --  0.54 0.57 0.49* 0.62  CALCIUM 9.0  --  8.2* 8.0* 7.8* 8.4  MG  --  1.5  --   --   --   --   PHOS  --  2.3  --   --   --   --    Liver Function Tests: No results for input(s): AST, ALT, ALKPHOS, BILITOT, PROT, ALBUMIN in the last 168 hours. No results for input(s): LIPASE, AMYLASE in the last 168 hours. No results for input(s): AMMONIA in the last 168 hours. CBC:  Recent Labs Lab 03/27/14 2141 03/28/14 0517 03/30/14 0459  WBC 8.0 6.7 8.2  HGB 13.2 11.6* 11.4*  HCT 38.4 33.7* 32.9*  MCV 89.3 88.5 88.2  PLT 254 270 299   Cardiac Enzymes: No results for input(s): CKTOTAL, CKMB, CKMBINDEX, TROPONINI in the last 168 hours. BNP: Invalid input(s): POCBNP CBG: No results for input(s): GLUCAP in the last 168 hours.  Recent Results (from the past 240 hour(s))  Blood culture (routine x 2)     Status: None (Preliminary result)   Collection Time: 03/27/14  9:40 PM  Result Value Ref Range Status   Specimen Description BLOOD R FOREARM  Final   Special Requests BOTTLES DRAWN AEROBIC AND ANAEROBIC 5CC  Final   Culture   Final           BLOOD CULTURE RECEIVED NO GROWTH TO DATE CULTURE WILL BE HELD FOR 5 DAYS BEFORE ISSUING A FINAL NEGATIVE REPORT Performed at [facility]    Report Status PENDING  Incomplete  Blood culture (routine x 2)     Status: None (Preliminary result)   Collection Time: 03/27/14 10:08 PM  Result Value Ref Range Status   Specimen Description BLOOD LEFT ANTECUBITAL  Final   Special Requests BOTTLES DRAWN AEROBIC AND ANAEROBIC 5CC  Final   Culture   Final           BLOOD CULTURE RECEIVED NO GROWTH TO DATE CULTURE WILL BE HELD FOR 5 DAYS BEFORE ISSUING A FINAL NEGATIVE REPORT Performed at [facility]    Report Status PENDING  Incomplete   Culture, sputum-assessment     Status: None   Collection Time: 03/28/14 12:06 PM  Result Value Ref Range Status   Specimen Description SPUTUM  Final   Special Requests NONE  Final   Sputum evaluation   Final    THIS SPECIMEN IS ACCEPTABLE. RESPIRATORY CULTURE REPORT TO FOLLOW.   Report Status 03/28/2014 FINAL  Final  Culture, respiratory (NON-Expectorated)     Status: None   Collection Time: 03/28/14 12:06 PM  Result Value Ref Range Status   Specimen Description SPUTUM  Final   Special Requests NONE  Final   Gram Stain   Final    RARE WBC  PRESENT, PREDOMINANTLY MONONUCLEAR NO SQUAMOUS EPITHELIAL CELLS SEEN RARE GRAM NEGATIVE RODS RARE GRAM POSITIVE COCCI IN PAIRS    Culture   Final    NORMAL OROPHARYNGEAL FLORA Performed at [facility]    Report Status 03/30/2014 FINAL  Final     Scheduled Meds: . amLODipine  5 mg Oral Daily  . azithromycin  500 mg Intravenous QHS  . B-complex with vitamin C  1 tablet Oral Daily  . budesonide (PULMICORT) nebulizer solution  0.25 mg Nebulization BID  . buPROPion  150 mg Oral Daily  . cefTRIAXone (ROCEPHIN)  IV  1 g Intravenous QHS  . docusate sodium  100 mg Oral BID  . famotidine  20 mg Oral QHS  . isosorbide-hydrALAZINE  1 tablet Oral BID  . levothyroxine  25 mcg Oral QAC breakfast  . pantoprazole  40 mg Oral Q1200  . polyethylene glycol  17 g Oral BID  . warfarin  2 mg Oral ONCE-1800  . Warfarin - Pharmacist Dosing Inpatient   Does not apply q1800   Continuous Infusions: . sodium chloride 75 mL/hr at 03/30/14 2207

## 2014-04-01 LAB — BASIC METABOLIC PANEL
Anion gap: 10 (ref 5–15)
BUN: 9 mg/dL (ref 6–23)
CO2: 22 mmol/L (ref 19–32)
Calcium: 8.7 mg/dL (ref 8.4–10.5)
Chloride: 98 mmol/L (ref 96–112)
Creatinine, Ser: 0.58 mg/dL (ref 0.50–1.10)
GFR calc Af Amer: >90 mL/min (ref >90)
GFR, EST NON AFRICAN AMERICAN: 78 mL/min — AB (ref >90)
Glucose, Bld: 99 mg/dL (ref 70–99)
POTASSIUM: 3.8 mmol/L (ref 3.5–5.1)
Sodium: 130 mmol/L — ABNORMAL LOW (ref 135–145)

## 2014-04-01 LAB — CORTISOL: Cortisol, Plasma: 12 ug/dL

## 2014-04-01 LAB — PROTIME-INR
INR: 2.74 — AB (ref 0.00–1.49)
Prothrombin Time: 29.2 seconds — ABNORMAL HIGH (ref 11.6–15.2)

## 2014-04-01 MED ORDER — DOCUSATE SODIUM 100 MG PO CAPS: 100.0000 mg | ORAL_CAPSULE | Freq: Two times a day (BID) | ORAL | Status: DC

## 2014-04-01 MED ORDER — ISOSORB DINITRATE-HYDRALAZINE 20-37.5 MG PO TABS: 1.0000 | ORAL_TABLET | Freq: Two times a day (BID) | ORAL | Status: DC

## 2014-04-01 MED ORDER — WARFARIN SODIUM 1 MG PO TABS
1.0000 mg | ORAL_TABLET | Freq: Once | ORAL | Status: DC
  Filled 2014-04-01: qty 1

## 2014-04-01 MED ORDER — ALUM & MAG HYDROXIDE-SIMETH 200-200-20 MG/5ML PO SUSP: 15.0000 mL | ORAL | Status: DC | PRN

## 2014-04-01 MED ORDER — SODIUM CHLORIDE 1 G PO TABS
1.0000 g | ORAL_TABLET | Freq: Two times a day (BID) | ORAL | Status: DC
  Filled 2014-04-01 (×2): qty 1

## 2014-04-01 NOTE — Discharge Summary (Signed)
Physician Discharge Summary  Candace Cain ZOX:[id number] DOB: [birth year] DOA: 03/27/2014  PCP:  Duane Lope, MD  Admit date: 03/27/2014 Discharge date: 04/01/2014  Recommendations for Outpatient Follow-up:  1.Take Levaquin as previously prescribed, it is reported you have 10 days supply. You may continue this on discharge. This would complete a total of 2 week treatment for pneumonia. 2. Continue Norvasc and idil on discharge for blood pressure control. 3. Holding Hctz due to hyponatremia and sodium has improved since admission, 121 to 130.  Discharge Diagnoses:  Principal Problem:   Lobar pneumonia due to unspecified organism Active Problems:   Essential hypertension   Atrial fibrillation   Hyponatremia   Dehydration, moderate   Hypothyroidism   GERD (gastroesophageal reflux disease)   Depression   Community acquired pneumonia    Discharge Condition: stable   Diet recommendation: as tolerated   History of present illness:  81 year old female with past medical history of hypertension, hypothyroidism, atrial fibrillation on anticoagulation with Coumadin, anxiety and depression who presented to [facility] long hospital with complaints of worsening shortness of breath with associated cough productive of whitish sputum. Patient reports her symptoms started couple of days prior to this admission. She saw her primary care physician and was started on Z-Pak for possible pneumonia but she did not experience significant symptomatic relief. This was then changed to Levaquin but per patient her symptoms continued to get worse even while on Levaquin. On admission, patient was hemodynamically stable. Chest x-ray showed early infiltrate in the right lung base. Patient was started on empiric azithromycin and Rocephin.   Assessment/Plan:    Principal Problem:  Lobar pneumonia due to unspecified organism / community-acquired pneumonian  - Patient started empirically on azithromycin and Rocephin.  Influenza panel is negative. Levaquin on discharge as prescribed for 10 days.  - Strep pneumonia negative. Legionella is negative. - Blood cultures to date are negative.  - Respiratory culture growing normal oropharyngeal flora.  Active Problems:  Essential hypertension - Continue Norvasc and Bidil - Prinizide on hold due to hyponatremia    Atrial fibrillation - CHADS2 vasc score 5 - Continue Coumadin per prior home regimen, INR 2.7 prior to discharge    Hypothyroidism - Continue Synthroid.  - TSH is within normal limits.    Depression - Continue Wellbutrin   Hyponatremia / hyperosmolar hyponatremia - Sodium level 121 but has trended up to 130 prior to discharge. Holding Prinizide    DVT Prophylaxis  - SCDs bilaterally   Code Status: DNR/DNI Family Communication: plan of care discussed with the patient; family not at the bedside, left VM on daughter's phone about D/C plan and left my contact info if any concerns ir for updates    IV access:  Peripheral IV  Procedures and diagnostic studies:   Dg Chest 2 View 03/27/2014 Early infiltrate in the right lung base posteriorly.   Dg Chest 2 View 03/26/2014 Possible bronchitis but no infiltrate or collapse.   Medical Consultants:  None  Other Consultants:  Physical therapy  IAnti-Infectives:   Azithromycin 03/28/2014 --> 04/01/2014  Rocephin 03/28/2014 --> 04/01/2014 Levaquin on discharge    Signed:  Manson Passey, MD  [facility] 04/01/2014, 10:47 AM  Pager #: [phone]  Discharge Exam: Filed Vitals:   04/01/14 0615  BP: 134/68  Pulse: 75  Temp: 97.9 F (36.6 C)  Resp: 18   Filed Vitals:   03/31/14 1936 03/31/14 2151 04/01/14 0615 04/01/14 0757  BP:  108/55 134/68   Pulse:  54 75   Temp:  97.6 F (36.4 C) 97.9 F (36.6 C)   TempSrc:  Oral Oral   Resp:  18 18   Height:      Weight:      SpO2: 96% 97% 97% 96%    General: Pt is alert, follows commands  appropriately, not in acute distress Cardiovascular: Rate controlled, S1/S2 appreciated  Respiratory: Clear to auscultation bilaterally, no wheezing, no crackles, no rhonchi Abdominal: Soft, non tender, non distended, bowel sounds +, no guarding Extremities: no edema, no cyanosis, pulses palpable bilaterally DP and PT Neuro: Grossly nonfocal  Discharge Instructions  Discharge Instructions    Call MD for:  difficulty breathing, headache or visual disturbances    Complete by:  As directed      Call MD for:  persistant dizziness or light-headedness    Complete by:  As directed      Call MD for:  persistant nausea and vomiting    Complete by:  As directed      Call MD for:  severe uncontrolled pain    Complete by:  As directed      Diet - low sodium heart healthy    Complete by:  As directed      Discharge instructions    Complete by:  As directed   1.Take Levaquin as previously prescribed, it is reported you have 10 days supply. You may continue this on discharge. This would complete a total of 2 week treatment for pneumonia. 2. Continue Norvasc and idil on discharge for blood pressure control.     Increase activity slowly    Complete by:  As directed             Medication List    STOP taking these medications        lisinopril-hydrochlorothiazide 20-12.5 MG per tablet  Commonly known as:  PRINZIDE,ZESTORETIC      TAKE these medications        alum & mag hydroxide-simeth 200-200-20 MG/5ML suspension  Commonly known as:  MAALOX/MYLANTA  Take 15 mLs by mouth every 4 (four) hours as needed for indigestion or heartburn.     amLODipine 5 MG tablet  Commonly known as:  NORVASC  Take 5 mg by mouth daily.     b complex vitamins tablet  Take 1 tablet by mouth daily.     buPROPion 150 MG 24 hr tablet  Commonly known as:  WELLBUTRIN XL  Take 150 mg by mouth daily.     docusate sodium 100 MG capsule  Commonly known as:  COLACE  Take 1 capsule (100 mg total) by mouth 2 (two)  times daily.     fish oil-omega-3 fatty acids 1000 MG capsule  Take 2 g by mouth daily.     isosorbide-hydrALAZINE 20-37.5 MG per tablet  Commonly known as:  BIDIL  Take 1 tablet by mouth 2 (two) times daily.     levofloxacin 500 MG tablet  Commonly known as:  LEVAQUIN  Take 500 mg by mouth daily.     levothyroxine 25 MCG tablet  Commonly known as:  SYNTHROID, LEVOTHROID  Take 25 mcg by mouth daily before breakfast.     multivitamin tablet  Take 1 tablet by mouth daily.     nitroGLYCERIN 0.4 MG SL tablet  Commonly known as:  NITROSTAT  Place 0.4 mg under the tongue every 5 (five) minutes as needed.     ranitidine 75 MG tablet  Commonly known as:  ZANTAC  Take 75 mg by mouth daily as  needed for heartburn.     terbinafine 250 MG tablet  Commonly known as:  LAMISIL  Take 250 mg by mouth daily.     TH CALCIUM CARBONATE-VITAMIN D 600-400 MG-UNIT per tablet  Generic drug:  Calcium Carbonate-Vitamin D  Take 2 tablets by mouth daily.     warfarin 2 MG tablet  Commonly known as:  COUMADIN  Take 1-2 mg by mouth daily. Takes  on Monday, Wednesday, Friday and takes 1/2 tablet ( ) on Tuesday, Thursday, Saturday and sunday            Follow-up Information    Follow up with  Duane Lope, MD. Schedule an appointment as soon as possible for a visit in 1 week.   Specialty:  Family Medicine   Why:  Follow up appt after recent hospitalization   Contact information:   [address] [place] [address] [phone]        The results of significant diagnostics from this hospitalization (including imaging, microbiology, ancillary and laboratory) are listed below for reference.    Significant Diagnostic Studies: Dg Chest 2 View  03/27/2014   CLINICAL DATA:  Productive cough  EXAM: CHEST  2 VIEW  COMPARISON:  03/26/2014  FINDINGS: Cardiac shadow is stable. The lungs are well aerated bilaterally. Increased density is noted in the posterior costophrenic angle on right  consistent with an early infiltrate. This was not well appreciated on the prior exam. No acute bony abnormality is seen.  IMPRESSION: Early infiltrate in the right lung base posteriorly.   Electronically Signed   By: Alcide Clever M.D.   On: 03/27/2014 21:55   Dg Chest 2 View  03/26/2014   CLINICAL DATA:  Cough for the last week. Hemoptysis. Fever. Abnormal lung exam on the right.  EXAM: CHEST  2 VIEW  COMPARISON:  06/04/2010  FINDINGS: Heart size is normal. The aorta is unfolded. There may be central bronchial thickening but there is no infiltrate, collapse or effusion. No acute bony finding.  IMPRESSION: Possible bronchitis but no infiltrate or collapse.   Electronically Signed   By: Paulina Fusi M.D.   On: 03/26/2014 16:33   Dg Abd Portable 1v  03/29/2014   CLINICAL DATA:  Initial evaluation left-sided abdominal pain for 1 month with nausea  EXAM: PORTABLE ABDOMEN - 1 VIEW  COMPARISON:  None.  FINDINGS: There are no abnormally dilated loops of bowel. There is stool throughout the entire colon. There is a 16 mm focus of calcification in the left lower quadrant. There are no abnormally dilated loops of bowel. There are a few nonspecific calcifications in the left upper quadrant.  IMPRESSION: 1. Constipation 2. Calcification left lower quadrant, nonspecific. This could represent calcification in an ovarian dermoid, or dense material in a diverticulum.   Electronically Signed   By: Esperanza Heir M.D.   On: 03/29/2014 10:48    Microbiology: Recent Results (from the past 240 hour(s))  Blood culture (routine x 2)     Status: None (Preliminary result)   Collection Time: 03/27/14  9:40 PM  Result Value Ref Range Status   Specimen Description BLOOD R FOREARM  Final   Special Requests BOTTLES DRAWN AEROBIC AND ANAEROBIC 5CC  Final   Culture   Final           BLOOD CULTURE RECEIVED NO GROWTH TO DATE CULTURE WILL BE HELD FOR 5 DAYS BEFORE ISSUING A FINAL NEGATIVE REPORT Performed at [facility]     Report Status PENDING  Incomplete  Blood culture (routine x 2)     Status: None (Preliminary result)   Collection Time: 03/27/14 10:08 PM  Result Value Ref Range Status   Specimen Description BLOOD LEFT ANTECUBITAL  Final   Special Requests BOTTLES DRAWN AEROBIC AND ANAEROBIC 5CC  Final   Culture   Final           BLOOD CULTURE RECEIVED NO GROWTH TO DATE CULTURE WILL BE HELD FOR 5 DAYS BEFORE ISSUING A FINAL NEGATIVE REPORT Performed at [facility]    Report Status PENDING  Incomplete  Culture, sputum-assessment     Status: None   Collection Time: 03/28/14 12:06 PM  Result Value Ref Range Status   Specimen Description SPUTUM  Final   Special Requests NONE  Final   Sputum evaluation   Final    THIS SPECIMEN IS ACCEPTABLE. RESPIRATORY CULTURE REPORT TO FOLLOW.   Report Status 03/28/2014 FINAL  Final  Culture, respiratory (NON-Expectorated)     Status: None   Collection Time: 03/28/14 12:06 PM  Result Value Ref Range Status   Specimen Description SPUTUM  Final   Special Requests NONE  Final   Gram Stain   Final    RARE WBC PRESENT, PREDOMINANTLY MONONUCLEAR NO SQUAMOUS EPITHELIAL CELLS SEEN RARE GRAM NEGATIVE RODS RARE GRAM POSITIVE COCCI IN PAIRS    Culture   Final    NORMAL OROPHARYNGEAL FLORA Performed at [facility]    Report Status 03/30/2014 FINAL  Final     Labs: Basic Metabolic Panel:  Recent Labs Lab 03/27/14 2141 03/28/14 0517 03/28/14 0530 03/29/14 1659 03/30/14 0459 03/31/14 0455  NA 122*  --  123* 121* 121* 121*  K 4.4  --  3.8 3.9 3.1* 3.4*  CL 88*  --  89* 91* 88* 90*  CO2 25  --  25 22 23 21   GLUCOSE 114*  --  86 88 95 86  BUN 17  --  12 11 10 8   CREATININE 0.62  --  0.54 0.57 0.49* 0.62  CALCIUM 9.0  --  8.2* 8.0* 7.8* 8.4  MG  --  1.5  --   --   --   --   PHOS  --  2.3  --   --   --   --    Liver Function Tests: No results for input(s): AST, ALT, ALKPHOS, BILITOT, PROT, ALBUMIN in the last 168 hours. No results for  input(s): LIPASE, AMYLASE in the last 168 hours. No results for input(s): AMMONIA in the last 168 hours. CBC:  Recent Labs Lab 03/27/14 2141 03/28/14 0517 03/30/14 0459  WBC 8.0 6.7 8.2  HGB 13.2 11.6* 11.4*  HCT 38.4 33.7* 32.9*  MCV 89.3 88.5 88.2  PLT 254 270 299   Cardiac Enzymes: No results for input(s): CKTOTAL, CKMB, CKMBINDEX, TROPONINI in the last 168 hours. BNP: BNP (last 3 results) No results for input(s): BNP in the last 8760 hours.  ProBNP (last 3 results) No results for input(s): PROBNP in the last 8760 hours.  CBG: No results for input(s): GLUCAP in the last 168 hours.  Time coordinating discharge: Over 30 minutes

## 2014-04-01 NOTE — Discharge Instructions (Signed)

## 2014-04-01 NOTE — Progress Notes (Signed)
ANTICOAGULATION CONSULT NOTE   Pharmacy Consult for warfarin Indication: paroxysmal atrial fibrillation  No Known Allergies  Patient Measurements: Height: 5\' 2"  (157.5 cm) Weight: 116 lb (52.617 kg) IBW/kg (Calculated) : 50.1  Vital Signs: Temp: 97.9 F (36.6 C) (03/22 0615) Temp Source: Oral (03/22 0615) BP: 134/68 mmHg (03/22 0615) Pulse Rate: 75 (03/22 0615)  Labs:  Recent Labs  03/29/14 1659 03/30/14 0459 03/31/14 0455 04/01/14 0451  HGB  --  11.4*  --   --   HCT  --  32.9*  --   --   PLT  --  299  --   --   LABPROT  --  20.6* 23.9* 29.2*  INR  --  1.76* 2.12* 2.74*  CREATININE 0.57 0.49* 0.62  --     Estimated Creatinine Clearance: 36.2 mL/min (by C-G formula based on Cr of 0.62).   Medical History: Past Medical History  Diagnosis Date  . Bradycardia   . Alopecia   . Unspecified essential hypertension   . Atrial fibrillation     Medications:  Scheduled:  . amLODipine  5 mg Oral Daily  . azithromycin  500 mg Intravenous QHS  . B-complex with vitamin C  1 tablet Oral Daily  . budesonide (PULMICORT) nebulizer solution  0.25 mg Nebulization BID  . buPROPion  150 mg Oral Daily  . cefTRIAXone (ROCEPHIN)  IV  1 g Intravenous QHS  . docusate sodium  100 mg Oral BID  . famotidine  20 mg Oral QHS  . isosorbide-hydrALAZINE  1 tablet Oral BID  . levothyroxine  25 mcg Oral QAC breakfast  . pantoprazole  40 mg Oral Q1200  . polyethylene glycol  17 g Oral BID  . sodium chloride  1 g Oral BID WC  . Warfarin - Pharmacist Dosing Inpatient   Does not apply q1800   Infusions:  . sodium chloride 75 mL/hr at 03/31/14 2001   PRN: alum & mag hydroxide-simeth, chlorpheniramine-HYDROcodone, ipratropium-albuterol, nitroGLYCERIN, ondansetron (ZOFRAN) IV  Assessment: 81 y/o on chronic warfarin for PAF, admitted through ED 03/27/14 with CAP. There was reportedly some associated mild transient hemoptysis.  PTA warfarin dosage reported as 2 mg M,W,F and 1mg  Tues, Thurs, Sat,  Sun.  Last dose was taken on 3/17.  INR on admission, 1.49, is subtherapeutic.  Pharmacy is consulted to resume warfarin dosing while inpatient.  Today, 04/01/2014:  INR therapeutic after warfarin booster doses (see MAR)  CBC (3/20): Hgb and Hct slightly low but stable; Pltc WNL  No bleeding or complications reported.  Diet: heart-healthy, carb-modified, eating 75-100% of meals  Drug-drug interaction: antibiotics may increase INR.  Goal of Therapy:  INR 2-3   Plan:   Warfarin 1 mg PO x 1 today as per patient's usual regimen if patient still here at 1800. Discharge orders in place.  Daily PT/INR while inpatient  Monitor for signs/symptoms of bleeding, follow clinical course.   Clance BollAmanda Hazleigh Mccleave, PharmD, BCPS Pager: [phone] 04/01/2014 11:21 AM

## 2014-04-01 NOTE — Care Management Note (Signed)
    Page 1 of 1   04/01/2014     12:27:52 PM CARE MANAGEMENT NOTE 04/01/2014  Patient:  Candace Cain, Candace Cain   Account Number:  [id number]  Date Initiated:  03/28/2014  Documentation initiated by:  DAVIS,RHONDA  Subjective/Objective Assessment:   confirmed pna by cxr and failed outpt treatment     Action/Plan:   home when stable   Anticipated DC Date:  03/31/2014   Anticipated DC Plan:  HOME/SELF CARE  In-house referral  NA      DC Planning Services  CM consult      PAC Choice  NA   Choice offered to / List presented to:          [facility] arranged  [facility] - 11 Patient Refused      Status of service:  Completed, signed off Medicare Important Message given?  YES (If response is "NO", the following Medicare IM given date fields will be blank) Date Medicare IM given:  04/01/2014 Medicare IM given by:  [facility] Date Additional Medicare IM given:   Additional Medicare IM given by:    Discharge Disposition:    Per UR Regulation:  Reviewed for med. necessity/level of care/duration of stay  If discussed at [place] of Stay Meetings, dates discussed:    Comments:  04/01/14 12:13 CM  met with pt and daughter in room and both confirm they (politely) refuse all [place] services.  No other CM needs were communicated. Mariane Masters, BSN, CM [phone].  March 28, 2014/Rhonda L. Rosana Hoes, RN, BSN, CCM. Case Management [place] [phone] No discharge needs present of time of review.

## 2014-04-03 LAB — CULTURE, BLOOD (ROUTINE X 2)
Culture: NO GROWTH
Culture: NO GROWTH

## 2014-04-25 ENCOUNTER — Emergency Department (HOSPITAL_COMMUNITY): Payer: Medicare Other

## 2014-04-25 ENCOUNTER — Inpatient Hospital Stay (HOSPITAL_COMMUNITY)
Admission: EM | Admit: 2014-04-25 | Discharge: 2014-04-29 | DRG: 470 | Disposition: A | Discharge status: Skilled Nursing Facility | Payer: Medicare Other | Attending: Internal Medicine | Admitting: Internal Medicine

## 2014-04-25 ENCOUNTER — Encounter (HOSPITAL_COMMUNITY): Payer: Self-pay | Admitting: Emergency Medicine

## 2014-04-25 DIAGNOSIS — N390 Urinary tract infection, site not specified: Secondary | ICD-10-CM | POA: Diagnosis present

## 2014-04-25 DIAGNOSIS — K219 Gastro-esophageal reflux disease without esophagitis: Secondary | ICD-10-CM | POA: Diagnosis present

## 2014-04-25 DIAGNOSIS — F32A Depression, unspecified: Secondary | ICD-10-CM | POA: Diagnosis present

## 2014-04-25 DIAGNOSIS — Z87891 Personal history of nicotine dependence: Secondary | ICD-10-CM | POA: Diagnosis not present

## 2014-04-25 DIAGNOSIS — S72141A Displaced intertrochanteric fracture of right femur, initial encounter for closed fracture: Secondary | ICD-10-CM | POA: Diagnosis not present

## 2014-04-25 DIAGNOSIS — E039 Hypothyroidism, unspecified: Secondary | ICD-10-CM | POA: Diagnosis present

## 2014-04-25 DIAGNOSIS — S72001A Fracture of unspecified part of neck of right femur, initial encounter for closed fracture: Secondary | ICD-10-CM | POA: Diagnosis not present

## 2014-04-25 DIAGNOSIS — W1830XA Fall on same level, unspecified, initial encounter: Secondary | ICD-10-CM | POA: Diagnosis present

## 2014-04-25 DIAGNOSIS — S72009A Fracture of unspecified part of neck of unspecified femur, initial encounter for closed fracture: Secondary | ICD-10-CM

## 2014-04-25 DIAGNOSIS — M25551 Pain in right hip: Secondary | ICD-10-CM | POA: Diagnosis not present

## 2014-04-25 DIAGNOSIS — S72001D Fracture of unspecified part of neck of right femur, subsequent encounter for closed fracture with routine healing: Secondary | ICD-10-CM | POA: Diagnosis not present

## 2014-04-25 DIAGNOSIS — I4891 Unspecified atrial fibrillation: Secondary | ICD-10-CM | POA: Diagnosis present

## 2014-04-25 DIAGNOSIS — I10 Essential (primary) hypertension: Secondary | ICD-10-CM | POA: Diagnosis present

## 2014-04-25 DIAGNOSIS — E871 Hypo-osmolality and hyponatremia: Secondary | ICD-10-CM | POA: Diagnosis present

## 2014-04-25 DIAGNOSIS — F329 Major depressive disorder, single episode, unspecified: Secondary | ICD-10-CM

## 2014-04-25 DIAGNOSIS — W19XXXA Unspecified fall, initial encounter: Secondary | ICD-10-CM

## 2014-04-25 DIAGNOSIS — N3000 Acute cystitis without hematuria: Secondary | ICD-10-CM | POA: Diagnosis not present

## 2014-04-25 DIAGNOSIS — Z96641 Presence of right artificial hip joint: Secondary | ICD-10-CM

## 2014-04-25 DIAGNOSIS — I482 Chronic atrial fibrillation: Secondary | ICD-10-CM | POA: Diagnosis not present

## 2014-04-25 DIAGNOSIS — Z7901 Long term (current) use of anticoagulants: Secondary | ICD-10-CM | POA: Diagnosis not present

## 2014-04-25 DIAGNOSIS — R0902 Hypoxemia: Secondary | ICD-10-CM

## 2014-04-25 DIAGNOSIS — Z66 Do not resuscitate: Secondary | ICD-10-CM | POA: Diagnosis present

## 2014-04-25 HISTORY — DX: Fracture of unspecified part of neck of unspecified femur, initial encounter for closed fracture: S72.009A

## 2014-04-25 LAB — BASIC METABOLIC PANEL
Anion gap: 9 (ref 5–15)
BUN: 16 mg/dL (ref 6–23)
CALCIUM: 9.3 mg/dL (ref 8.4–10.5)
CHLORIDE: 100 mmol/L (ref 96–112)
CO2: 26 mmol/L (ref 19–32)
CREATININE: 0.72 mg/dL (ref 0.50–1.10)
GFR calc non Af Amer: 73 mL/min — ABNORMAL LOW (ref >90)
GFR, EST AFRICAN AMERICAN: 84 mL/min — AB (ref >90)
Glucose, Bld: 127 mg/dL — ABNORMAL HIGH (ref 70–99)
POTASSIUM: 3.7 mmol/L (ref 3.5–5.1)
Sodium: 135 mmol/L (ref 135–145)

## 2014-04-25 LAB — CBC WITH DIFFERENTIAL/PLATELET
BASOS ABS: 0 10*3/uL (ref 0.0–0.1)
Basophils Relative: 0 % (ref 0–1)
EOS ABS: 0 10*3/uL (ref 0.0–0.7)
Eosinophils Relative: 0 % (ref 0–5)
HCT: 39.3 % (ref 36.0–46.0)
Hemoglobin: 12.9 g/dL (ref 12.0–15.0)
Lymphocytes Relative: 5 % — ABNORMAL LOW (ref 12–46)
Lymphs Abs: 0.7 10*3/uL (ref 0.7–4.0)
MCH: 30.4 pg (ref 26.0–34.0)
MCHC: 32.8 g/dL (ref 30.0–36.0)
MCV: 92.5 fL (ref 78.0–100.0)
Monocytes Absolute: 0.6 10*3/uL (ref 0.1–1.0)
Monocytes Relative: 4 % (ref 3–12)
NEUTROS ABS: 13.4 10*3/uL — AB (ref 1.7–7.7)
NEUTROS PCT: 91 % — AB (ref 43–77)
Platelets: 273 10*3/uL (ref 150–400)
RBC: 4.25 MIL/uL (ref 3.87–5.11)
RDW: 13.4 % (ref 11.5–15.5)
WBC: 14.7 10*3/uL — ABNORMAL HIGH (ref 4.0–10.5)

## 2014-04-25 LAB — URINALYSIS, ROUTINE W REFLEX MICROSCOPIC
Bilirubin Urine: NEGATIVE
GLUCOSE, UA: NEGATIVE mg/dL
Ketones, ur: NEGATIVE mg/dL
Nitrite: NEGATIVE
PH: 8.5 — AB (ref 5.0–8.0)
Protein, ur: 30 mg/dL — AB
SPECIFIC GRAVITY, URINE: 1.01 (ref 1.005–1.030)
Urobilinogen, UA: 0.2 mg/dL (ref 0.0–1.0)

## 2014-04-25 LAB — TYPE AND SCREEN
ABO/RH(D): A POS
Antibody Screen: NEGATIVE

## 2014-04-25 LAB — URINE MICROSCOPIC-ADD ON

## 2014-04-25 LAB — ABO/RH: ABO/RH(D): A POS

## 2014-04-25 LAB — PROTIME-INR
INR: 3.62 — AB (ref 0.00–1.49)
Prothrombin Time: 36.3 seconds — ABNORMAL HIGH (ref 11.6–15.2)

## 2014-04-25 MED ORDER — SODIUM CHLORIDE 0.9 % IV SOLN
1000.0000 mL | INTRAVENOUS | Status: DC
  Administered 2014-04-25: 1000 mL via INTRAVENOUS

## 2014-04-25 MED ORDER — ONDANSETRON HCL 4 MG/2ML IJ SOLN
4.0000 mg | Freq: Once | INTRAMUSCULAR | Status: DC
  Filled 2014-04-25: qty 2

## 2014-04-25 MED ORDER — ONDANSETRON HCL 4 MG/2ML IJ SOLN
4.0000 mg | Freq: Once | INTRAMUSCULAR | Status: AC
  Administered 2014-04-25: 4 mg via INTRAVENOUS
  Filled 2014-04-25: qty 2

## 2014-04-25 MED ORDER — OXYCODONE-ACETAMINOPHEN 5-325 MG PO TABS
1.0000 | ORAL_TABLET | Freq: Once | ORAL | Status: DC
  Administered 2014-04-26: 1 via ORAL
  Filled 2014-04-25: qty 1

## 2014-04-25 MED ORDER — FENTANYL CITRATE (PF) 100 MCG/2ML IJ SOLN
50.0000 ug | INTRAMUSCULAR | Status: DC | PRN
  Administered 2014-04-25: 50 ug via INTRAVENOUS
  Filled 2014-04-25: qty 2

## 2014-04-25 NOTE — ED Notes (Signed)
Bed: [id number] Expected date:  Expected time:  Means of arrival:  Comments: EMS 38F fall in parking lot

## 2014-04-25 NOTE — ED Provider Notes (Signed)
CSN: [id number]     Arrival date & time 04/25/14  1940 History   First MD Initiated Contact with Patient 04/25/14 2000     Chief Complaint  Patient presents with  . Fall     (Consider location/radiation/quality/duration/timing/severity/associated sxs/prior Treatment) HPI Comments: Patient complains of right hip pain. She states she was getting out of her car in a parking lot and lost her balance and fell. She denies any dizziness chest pain shortness of breath or other symptoms preceding the fall. She states she she's been feeling well all day. She states that she's just briefly lost her balance and fell onto her right side. She complains of pain to her right hip. She denies hitting her head. She denies any neck or back pain. There is no loss of consciousness. She's had constant pain to her right hip since the incident that happened just prior to arrival. She was brought in by EMS and was given 50 g of fentanyl prior to arrival. She states her pain is little bit better after the fentanyl that she's been feeling nauseated. She denies any other injuries from the fall. Of note, she is on coumadin.  Patient is a 81 y.o. female presenting with fall.  Fall Pertinent negatives include no chest pain, no abdominal pain, no headaches and no shortness of breath.    Past Medical History  Diagnosis Date  . Bradycardia   . Alopecia   . Unspecified essential hypertension   . Atrial fibrillation    Past Surgical History  Procedure Laterality Date  . Hysterectomy, unspecifed area     Family History  Problem Relation Age of Onset  . Cancer      family history of it  . Coronary artery disease      family history of it   History  Substance Use Topics  . Smoking status: Former Games developer  . Smokeless tobacco: Never Used  . Alcohol Use: Yes     Comment: occasional wine    OB History    No data available     Review of Systems  Constitutional: Negative for fever, chills, diaphoresis and fatigue.   HENT: Negative for congestion, rhinorrhea and sneezing.   Eyes: Negative.   Respiratory: Negative for cough, chest tightness and shortness of breath.   Cardiovascular: Negative for chest pain and leg swelling.  Gastrointestinal: Negative for nausea, vomiting, abdominal pain, diarrhea and blood in stool.  Genitourinary: Negative for frequency, hematuria, flank pain and difficulty urinating.  Musculoskeletal: Positive for arthralgias. Negative for back pain.  Skin: Negative for rash.  Neurological: Negative for dizziness, speech difficulty, weakness, numbness and headaches.      Allergies  Review of patient's allergies indicates no known allergies.  Home Medications   Prior to Admission medications   Medication Sig Start Date End Date Taking? Authorizing Provider  alum & mag hydroxide-simeth (MAALOX/MYLANTA) 200-200-20 MG/5ML suspension Take 15 mLs by mouth every 4 (four) hours as needed for indigestion or heartburn. 04/01/14  Yes Alison Murray, MD  amLODipine (NORVASC) 5 MG tablet Take 5 mg by mouth daily.     Yes Historical Provider, MD  b complex vitamins tablet Take 1 tablet by mouth daily.   Yes Historical Provider, MD  buPROPion (WELLBUTRIN XL) 150 MG 24 hr tablet Take 150 mg by mouth daily.   Yes Historical Provider, MD  Calcium Carbonate-Vitamin D (TH CALCIUM CARBONATE-VITAMIN D) 600-400 MG-UNIT per tablet Take 2 tablets by mouth daily.     Yes Historical Provider, MD  fish oil-omega-3 fatty acids 1000 MG capsule Take 1 g by mouth 2 (two) times daily.    Yes Historical Provider, MD  isosorbide-hydrALAZINE (BIDIL) 20-37.5 MG per tablet Take 1 tablet by mouth 2 (two) times daily. 04/01/14  Yes Alison Murray, MD  levothyroxine (SYNTHROID, LEVOTHROID) 25 MCG tablet Take 25 mcg by mouth daily before breakfast.   Yes Historical Provider, MD  Multiple Vitamin (MULTIVITAMIN) tablet Take 1 tablet by mouth daily.     Yes Historical Provider, MD  terbinafine (LAMISIL) 250 MG tablet Take 250  mg by mouth daily.   Yes Historical Provider, MD  warfarin (COUMADIN) 2 MG tablet Take 2-3 mg by mouth daily. Take 1 tablet (2 mg) by mouth on Monday, Wednesday, Friday and Take 1.5 tablets (3 mg) on Tuesday, Thursday, Saturday and Sunday   Yes Historical Provider, MD  docusate sodium (COLACE) 100 MG capsule Take 1 capsule (100 mg total) by mouth 2 (two) times daily. 04/01/14   Alison Murray, MD  nitroGLYCERIN (NITROSTAT) 0.4 MG SL tablet Place 0.4 mg under the tongue every 5 (five) minutes as needed.      Historical Provider, MD  ranitidine (ZANTAC) 75 MG tablet Take 75 mg by mouth daily as needed for heartburn.    Historical Provider, MD   BP 152/79 mmHg  Pulse 76  Temp(Src) 97.6 F (36.4 C)  Resp 20  SpO2 94% Physical Exam  Constitutional: She is oriented to person, place, and time. She appears well-developed and well-nourished.  HENT:  Head: Normocephalic and atraumatic.  Eyes: Pupils are equal, round, and reactive to light.  Neck: Normal range of motion. Neck supple.  Cardiovascular: Normal rate, regular rhythm and normal heart sounds.   Pulmonary/Chest: Effort normal and breath sounds normal. No respiratory distress. She has no wheezes. She has no rales. She exhibits no tenderness.  Abdominal: Soft. Bowel sounds are normal. There is no tenderness. There is no rebound and no guarding.  Musculoskeletal: Normal range of motion. She exhibits no edema.  Positive tenderness on range of motion of the right hip. There some mild internal rotation of the right leg. There is no pain to the knee or the ankle. Pedal pulses are intact. She has no pain along the cervical thoracic or lumbosacral spine. There is no other pain on palpation or range of motion extremities  Lymphadenopathy:    She has no cervical adenopathy.  Neurological: She is alert and oriented to person, place, and time.  Skin: Skin is warm and dry. No rash noted.  Psychiatric: She has a normal mood and affect.    ED Course   Procedures (including critical care time) Labs Review Labs Reviewed  PROTIME-INR - Abnormal; Notable for the following:    Prothrombin Time 36.3 (*)    INR 3.62 (*)    All other components within normal limits  BASIC METABOLIC PANEL - Abnormal; Notable for the following:    Glucose, Bld 127 (*)    GFR calc non Af Amer 73 (*)    GFR calc Af Amer 84 (*)    All other components within normal limits  CBC WITH DIFFERENTIAL/PLATELET - Abnormal; Notable for the following:    WBC 14.7 (*)    Neutrophils Relative % 91 (*)    Neutro Abs 13.4 (*)    Lymphocytes Relative 5 (*)    All other components within normal limits  URINALYSIS, ROUTINE W REFLEX MICROSCOPIC - Abnormal; Notable for the following:    APPearance TURBID (*)  pH 8.5 (*)    Hgb urine dipstick MODERATE (*)    Protein, ur 30 (*)    Leukocytes, UA SMALL (*)    All other components within normal limits  URINE MICROSCOPIC-ADD ON - Abnormal; Notable for the following:    Bacteria, UA MANY (*)    All other components within normal limits  TYPE AND SCREEN  ABO/RH    Imaging Review Dg Chest Port 1 View  04/25/2014   CLINICAL DATA:  Larey SeatFell tonight. History of atrial fibrillation and bradycardia.  EXAM: PORTABLE CHEST - 1 VIEW  COMPARISON:  03/27/2014  FINDINGS: The heart is enlarged but stable. There is tortuosity 10 mild calcification of the thoracic aorta. There are chronic bronchitic type interstitial lung changes but no acute overlying pulmonary process. Mild eventration of both hemidiaphragms. No definite pleural effusions.  IMPRESSION: No acute cardiopulmonary findings.   Electronically Signed   By: Rudie MeyerP.  Gallerani M.D.   On: 04/25/2014 22:07   Dg Hip Unilat With Pelvis 2-3 Views Right  04/25/2014   CLINICAL DATA:  Larey SeatFell today in the parking lot and injured right hip.  EXAM: RIGHT HIP (WITH PELVIS) 2-3 VIEWS  COMPARISON:  03/29/2014  FINDINGS: There is a displaced femoral neck fracture with shortening an lateral displacement. The  left hip is intact. The pubic symphysis and SI joints are intact. No definite pelvic fractures. Stable bilateral buttock soft tissue calcifications.  IMPRESSION: Displaced right femoral neck fracture.   Electronically Signed   By: Rudie MeyerP.  Gallerani M.D.   On: 04/25/2014 21:41     EKG Interpretation   Date/Time:  Friday April 25 2014 20:10:37 EDT Ventricular Rate:  75 PR Interval:  237 QRS Duration: 90 QT Interval:  406 QTC Calculation: 453 R Axis:   -20 Text Interpretation:  Sinus arrhythmia Prolonged PR interval Borderline  left axis deviation Low voltage, extremity leads PR increased as compared  to prior EKG Confirmed by Ramondo Dietze  MD, Kalliopi Coupland ([id number]) on 04/25/2014 11:01:07  PM      MDM   Final diagnoses:  Fall  Hip fracture requiring operative repair, right, closed, initial encounter    Pt presents after a mechanical fall landing on her right hip. She has evidence of a right femoral neck fracture. There is no other evidence of injuries. I asked her several times and she denies hitting her head. She states that she landed right on her hip. We even asked her in Spanish to verify that she did not hit her head in any way shape or form and she denies hitting her head at all. Her INR is elevated at 3.62. I spoke with Dr. Jerl Santosalldorf with [facility] who will see the pt and I spoke with Dr. Allena KatzPatel with the hospitalist service who will admit the pt.    Rolan BuccoMelanie Murad Staples, MD 04/25/14 [phone]

## 2014-04-25 NOTE — ED Notes (Signed)
Pts POA Candace Cain called [phone], please call with update. Pt gave verbal ok for us to discuss care with her.

## 2014-04-25 NOTE — ED Notes (Signed)
Entered room to administer pain medication Patient's family asking for additional nausea medication and to speak with EDP Patient's family state that they do not want any pain medication given until they speak with EDP and nausea medication is ordered Dr. Fredderick PhenixBelfi made aware

## 2014-04-25 NOTE — ED Notes (Signed)
Pt arrived to the ED with a complaint of a fall.  Pt states she tripped in the parking lot. Pt is complaining of right hip pain.  EMS states they don't notice any deformity or outward rotation.   Pt does have knee bent and can't move her leg without pain.  Pt was given 50 mg of fentanyl by EMS with some relief.

## 2014-04-25 NOTE — H&P (Signed)
[facility] History and Physical  Patient: Candace Cain  MRN: [id number]  DOB: [birth year]  DOS: the patient was seen and examined on 04/25/2014 PCP:  Duane Lope, MD  Chief Complaint: Fall  HPI: Candace Cain is a 81 y.o. female with Past medical history of hypertension, A. fib on Coumadin, GERD, depression and hypothyroidism. The patient presented with complaints of a fall. The fall was mechanical. She was with her friends and was trying to come out of the car and tripped over her shoe and fell on the ground on the right side. She denies hitting her head and denies hitting her neck. Does not have any complaint of headache or neck pain. Does not have any focal deficit. She denies passing out. She denies any dizziness or lightheadedness denies any nausea vomiting diarrhea or constipation prior to this event. Does not have any burning urination. Does not have any focal deficit at the time of my evaluation. No recent change in her medications reported. She denies any prior history of coronary artery disease or strokes.  The patient is coming from home. And at her baseline independent for most of her ADL.  Review of Systems: as mentioned in the history of present illness.  A comprehensive review of the other systems is negative.  Past Medical History  Diagnosis Date  . Bradycardia   . Alopecia   . Unspecified essential hypertension   . Atrial fibrillation    Past Surgical History  Procedure Laterality Date  . Hysterectomy, unspecifed area     Social History:  reports that she has quit smoking. She has never used smokeless tobacco. She reports that she drinks alcohol. She reports that she does not use illicit drugs.  No Known Allergies  Family History  Problem Relation Age of Onset  . Cancer      family history of it  . Coronary artery disease      family history of it    Prior to Admission medications   Medication Sig Start Date End Date Taking? Authorizing Provider   alum & mag hydroxide-simeth (MAALOX/MYLANTA) 200-200-20 MG/5ML suspension Take 15 mLs by mouth every 4 (four) hours as needed for indigestion or heartburn. 04/01/14  Yes Alison Murray, MD  amLODipine (NORVASC) 5 MG tablet Take 5 mg by mouth daily.     Yes Historical Provider, MD  b complex vitamins tablet Take 1 tablet by mouth daily.   Yes Historical Provider, MD  buPROPion (WELLBUTRIN XL) 150 MG 24 hr tablet Take 150 mg by mouth daily.   Yes Historical Provider, MD  Calcium Carbonate-Vitamin D (TH CALCIUM CARBONATE-VITAMIN D) 600-400 MG-UNIT per tablet Take 2 tablets by mouth daily.     Yes Historical Provider, MD  fish oil-omega-3 fatty acids 1000 MG capsule Take 1 g by mouth 2 (two) times daily.    Yes Historical Provider, MD  isosorbide-hydrALAZINE (BIDIL) 20-37.5 MG per tablet Take 1 tablet by mouth 2 (two) times daily. 04/01/14  Yes Alison Murray, MD  levothyroxine (SYNTHROID, LEVOTHROID) 25 MCG tablet Take 25 mcg by mouth daily before breakfast.   Yes Historical Provider, MD  Multiple Vitamin (MULTIVITAMIN) tablet Take 1 tablet by mouth daily.     Yes Historical Provider, MD  terbinafine (LAMISIL) 250 MG tablet Take 250 mg by mouth daily.   Yes Historical Provider, MD  warfarin (COUMADIN) 2 MG tablet Take 2-3 mg by mouth daily. Take 1 tablet (2 mg) by mouth on Monday, Wednesday, Friday and Take 1.5 tablets (3 mg)  on Tuesday, Thursday, Saturday and Sunday   Yes Historical Provider, MD  docusate sodium (COLACE) 100 MG capsule Take 1 capsule (100 mg total) by mouth 2 (two) times daily. 04/01/14   Alison MurrayAlma M Devine, MD  nitroGLYCERIN (NITROSTAT) 0.4 MG SL tablet Place 0.4 mg under the tongue every 5 (five) minutes as needed.      Historical Provider, MD  ranitidine (ZANTAC) 75 MG tablet Take 75 mg by mouth daily as needed for heartburn.    Historical Provider, MD    Physical Exam: Filed Vitals:   04/25/14 1942 04/25/14 1958  BP:  152/79  Pulse:  76  Temp:  97.6 F (36.4 C)  Resp:  20  SpO2:  98% 94%    General: Alert, Awake and Oriented to Time, Place and Person. Appear in mild distress Eyes: PERRL ENT: Oral Mucosa clear moist. Neck: no JVD Cardiovascular: S1 and S2 Present, no Murmur, Peripheral Pulses Present Respiratory: Bilateral Air entry equal and Decreased,  Clear to Auscultation, noCrackles, no wheezes Abdomen: Bowel Sound present, Soft and non tender Skin: no Rash Extremities: no Pedal edema, no calf tenderness Neurologic: Grossly no focal neuro deficit.  Labs on Admission:  CBC:  Recent Labs Lab 04/25/14 2218  WBC 14.7*  NEUTROABS 13.4*  HGB 12.9  HCT 39.3  MCV 92.5  PLT 273    CMP     Component Value Date/Time   NA 135 04/25/2014 2218   K 3.7 04/25/2014 2218   CL 100 04/25/2014 2218   CO2 26 04/25/2014 2218   GLUCOSE 127* 04/25/2014 2218   BUN 16 04/25/2014 2218   CREATININE 0.72 04/25/2014 2218   CALCIUM 9.3 04/25/2014 2218   GFRNONAA 73* 04/25/2014 2218   GFRAA 84* 04/25/2014 2218    No results for input(s): LIPASE, AMYLASE in the last 168 hours.  No results for input(s): CKTOTAL, CKMB, CKMBINDEX, TROPONINI in the last 168 hours. BNP (last 3 results) No results for input(s): BNP in the last 8760 hours.  ProBNP (last 3 results) No results for input(s): PROBNP in the last 8760 hours.   Radiological Exams on Admission: Dg Chest Port 1 View  04/25/2014   CLINICAL DATA:  Larey SeatFell tonight. History of atrial fibrillation and bradycardia.  EXAM: PORTABLE CHEST - 1 VIEW  COMPARISON:  03/27/2014  FINDINGS: The heart is enlarged but stable. There is tortuosity 10 mild calcification of the thoracic aorta. There are chronic bronchitic type interstitial lung changes but no acute overlying pulmonary process. Mild eventration of both hemidiaphragms. No definite pleural effusions.  IMPRESSION: No acute cardiopulmonary findings.   Electronically Signed   By: Rudie MeyerP.  Gallerani M.D.   On: 04/25/2014 22:07   Dg Hip Unilat With Pelvis 2-3 Views Right  04/25/2014    CLINICAL DATA:  Larey SeatFell today in the parking lot and injured right hip.  EXAM: RIGHT HIP (WITH PELVIS) 2-3 VIEWS  COMPARISON:  03/29/2014  FINDINGS: There is a displaced femoral neck fracture with shortening an lateral displacement. The left hip is intact. The pubic symphysis and SI joints are intact. No definite pelvic fractures. Stable bilateral buttock soft tissue calcifications.  IMPRESSION: Displaced right femoral neck fracture.   Electronically Signed   By: Rudie MeyerP.  Gallerani M.D.   On: 04/25/2014 21:41   EKG: Independently reviewed. Sinus arrhythmia  Assessment/Plan Principal Problem:   Closed right hip fracture Active Problems:   Essential hypertension   Atrial fibrillation   Hyponatremia   GERD (gastroesophageal reflux disease)   Depression   1. Closed  right hip fracture The patient is presenting with a mechanical fall. After the fall she had complaints of severe right-sided pain with spasm. X-rays showing right hip intertrochanteric fracture. Orthopedic has been consulted who will be following up with the patient. Pain management as indicated.  2. Preoperative evaluation. A) Cardiac risk: the patient is a moderate to high risk for adverse Cardiac outcome from surgery secondary to her age. the risks and benefits were discussed and acceptable to patient/family. Patient is on anticoagulation. A. fib does not require bridging with heparin. We will give her IV vitamin K 5 mg and try to reverse her INR. Recheck INR in the morning and further dosing of vitamin K as needed.  2) Pulmonary risk: Recommend continue use of PRN nebulizer, and optimization of lund function with use of inhalers and incentive spirometry. Good pulmunary toilet.  3) General risk: Avoid major fluctuation in blood pressure intra-op and post operatively. Minimal sedation and Narcotics.  Will request Surgeon to please Order Lovenox/DVT prophylaxis of his/her choice when OK from Surgeon's standpoint post op.    3. Essential hypertension. Continuing home medications. Patient was discharged recently on BiDil but due to the cost the patient was switched to some other medication which the family is not aware of. At present family to provide further information about that medication.  4. Hypothyroidism. Continuing Synthroid.  Advance goals of care discussion: DNR/DNI   Consults: Orthopedics  DVT Prophylaxis: subcutaneous Heparin on chronic therapeutic anticoagulation. Nutrition: Cardiac diet since surgeries ground on Sunday  Family Communication: family was present at bedside, opportunity was given to ask question and all questions were answered satisfactorily at the time of interview. Disposition: Admitted as inpatient, med-surge unit.  Author: Lynden Oxford, MD [facility] Pager: [phone] 04/25/2014  If 7PM-7AM, please contact night-coverage [email or web address] Password [email or web address]

## 2014-04-26 ENCOUNTER — Encounter (HOSPITAL_COMMUNITY): Payer: Self-pay | Admitting: *Deleted

## 2014-04-26 DIAGNOSIS — S72001A Fracture of unspecified part of neck of right femur, initial encounter for closed fracture: Secondary | ICD-10-CM

## 2014-04-26 HISTORY — DX: Fracture of unspecified part of neck of right femur, initial encounter for closed fracture: S72.001A

## 2014-04-26 LAB — CBC WITH DIFFERENTIAL/PLATELET
BASOS ABS: 0 10*3/uL (ref 0.0–0.1)
Basophils Relative: 0 % (ref 0–1)
EOS ABS: 0 10*3/uL (ref 0.0–0.7)
EOS PCT: 0 % (ref 0–5)
HEMATOCRIT: 38 % (ref 36.0–46.0)
Hemoglobin: 12.6 g/dL (ref 12.0–15.0)
LYMPHS ABS: 0.5 10*3/uL — AB (ref 0.7–4.0)
LYMPHS PCT: 5 % — AB (ref 12–46)
MCH: 30.7 pg (ref 26.0–34.0)
MCHC: 33.2 g/dL (ref 30.0–36.0)
MCV: 92.5 fL (ref 78.0–100.0)
MONOS PCT: 4 % (ref 3–12)
Monocytes Absolute: 0.5 10*3/uL (ref 0.1–1.0)
Neutro Abs: 10.2 10*3/uL — ABNORMAL HIGH (ref 1.7–7.7)
Neutrophils Relative %: 91 % — ABNORMAL HIGH (ref 43–77)
Platelets: 243 10*3/uL (ref 150–400)
RBC: 4.11 MIL/uL (ref 3.87–5.11)
RDW: 13.3 % (ref 11.5–15.5)
WBC: 11.1 10*3/uL — AB (ref 4.0–10.5)

## 2014-04-26 LAB — COMPREHENSIVE METABOLIC PANEL
ALBUMIN: 4 g/dL (ref 3.5–5.2)
ALT: 25 U/L (ref 0–35)
AST: 35 U/L (ref 0–37)
Alkaline Phosphatase: 64 U/L (ref 39–117)
Anion gap: 9 (ref 5–15)
BUN: 15 mg/dL (ref 6–23)
CHLORIDE: 99 mmol/L (ref 96–112)
CO2: 24 mmol/L (ref 19–32)
CREATININE: 0.68 mg/dL (ref 0.50–1.10)
Calcium: 8.7 mg/dL (ref 8.4–10.5)
GFR calc Af Amer: 86 mL/min — ABNORMAL LOW (ref >90)
GFR, EST NON AFRICAN AMERICAN: 74 mL/min — AB (ref >90)
Glucose, Bld: 132 mg/dL — ABNORMAL HIGH (ref 70–99)
Potassium: 3.8 mmol/L (ref 3.5–5.1)
Sodium: 132 mmol/L — ABNORMAL LOW (ref 135–145)
Total Bilirubin: 0.4 mg/dL (ref 0.3–1.2)
Total Protein: 6.7 g/dL (ref 6.0–8.3)

## 2014-04-26 LAB — PROTIME-INR
INR: 3.06 — ABNORMAL HIGH (ref 0.00–1.49)
PROTHROMBIN TIME: 31.8 s — AB (ref 11.6–15.2)

## 2014-04-26 LAB — SURGICAL PCR SCREEN
MRSA, PCR: NEGATIVE
STAPHYLOCOCCUS AUREUS: NEGATIVE

## 2014-04-26 MED ORDER — BUPROPION HCL ER (XL) 150 MG PO TB24
150.0000 mg | ORAL_TABLET | Freq: Every day | ORAL | Status: DC
  Administered 2014-04-26 – 2014-04-29 (×3): 150 mg via ORAL
  Filled 2014-04-26 (×4): qty 1

## 2014-04-26 MED ORDER — FERROUS SULFATE 325 (65 FE) MG PO TABS
325.0000 mg | ORAL_TABLET | Freq: Every day | ORAL | Status: DC
  Administered 2014-04-26 – 2014-04-29 (×3): 325 mg via ORAL
  Filled 2014-04-26 (×5): qty 1

## 2014-04-26 MED ORDER — SENNOSIDES-DOCUSATE SODIUM 8.6-50 MG PO TABS: 1.0000 | ORAL_TABLET | Freq: Every evening | ORAL | Status: DC | PRN

## 2014-04-26 MED ORDER — METHOCARBAMOL 500 MG PO TABS
500.0000 mg | ORAL_TABLET | Freq: Three times a day (TID) | ORAL | Status: DC | PRN
  Administered 2014-04-26 – 2014-04-28 (×6): 500 mg via ORAL
  Filled 2014-04-26 (×6): qty 1

## 2014-04-26 MED ORDER — AMLODIPINE BESYLATE 10 MG PO TABS
10.0000 mg | ORAL_TABLET | Freq: Every day | ORAL | Status: DC
  Administered 2014-04-26 – 2014-04-29 (×3): 10 mg via ORAL
  Filled 2014-04-26 (×4): qty 1

## 2014-04-26 MED ORDER — PHYTONADIONE 5 MG PO TABS
5.0000 mg | ORAL_TABLET | Freq: Once | ORAL | Status: AC
  Administered 2014-04-26: 5 mg via ORAL
  Filled 2014-04-26: qty 1

## 2014-04-26 MED ORDER — DOCUSATE SODIUM 100 MG PO CAPS
100.0000 mg | ORAL_CAPSULE | Freq: Two times a day (BID) | ORAL | Status: DC
  Administered 2014-04-26 – 2014-04-29 (×6): 100 mg via ORAL

## 2014-04-26 MED ORDER — ONDANSETRON HCL 4 MG/2ML IJ SOLN
4.0000 mg | Freq: Three times a day (TID) | INTRAMUSCULAR | Status: AC | PRN
  Administered 2014-04-26 (×2): 4 mg via INTRAVENOUS
  Filled 2014-04-26: qty 2

## 2014-04-26 MED ORDER — OXYCODONE HCL 5 MG PO TABS
5.0000 mg | ORAL_TABLET | ORAL | Status: DC | PRN
  Administered 2014-04-26 – 2014-04-27 (×3): 5 mg via ORAL
  Filled 2014-04-26 (×3): qty 1

## 2014-04-26 MED ORDER — VITAMIN K1 10 MG/ML IJ SOLN: 10.0000 mg | Freq: Once | INTRAVENOUS | Status: DC

## 2014-04-26 MED ORDER — HYDROCODONE-ACETAMINOPHEN 5-325 MG PO TABS
1.0000 | ORAL_TABLET | Freq: Four times a day (QID) | ORAL | Status: DC | PRN
  Administered 2014-04-26 (×3): 2 via ORAL
  Filled 2014-04-26 (×3): qty 2

## 2014-04-26 MED ORDER — CEFTRIAXONE SODIUM IN DEXTROSE 20 MG/ML IV SOLN
1.0000 g | INTRAVENOUS | Status: DC
  Administered 2014-04-26: 1 g via INTRAVENOUS
  Filled 2014-04-26 (×2): qty 50

## 2014-04-26 MED ORDER — LEVOTHYROXINE SODIUM 25 MCG PO TABS
25.0000 ug | ORAL_TABLET | Freq: Every day | ORAL | Status: DC
  Administered 2014-04-26 – 2014-04-29 (×4): 25 ug via ORAL
  Filled 2014-04-26 (×5): qty 1

## 2014-04-26 MED ORDER — SODIUM CHLORIDE 0.9 % IV SOLN
INTRAVENOUS | Status: DC
  Administered 2014-04-26 – 2014-04-27 (×2): via INTRAVENOUS
  Administered 2014-04-27: 75 mL/h via INTRAVENOUS

## 2014-04-26 MED ORDER — VITAMIN K1 10 MG/ML IJ SOLN
5.0000 mg | Freq: Once | INTRAVENOUS | Status: AC
  Administered 2014-04-26: 5 mg via INTRAVENOUS
  Filled 2014-04-26: qty 0.5

## 2014-04-26 MED ORDER — HYDROCODONE-ACETAMINOPHEN 5-325 MG PO TABS
1.0000 | ORAL_TABLET | ORAL | Status: DC | PRN
  Administered 2014-04-26 – 2014-04-27 (×4): 2 via ORAL
  Administered 2014-04-28 (×2): 1 via ORAL
  Administered 2014-04-28 (×2): 2 via ORAL
  Administered 2014-04-28: 1 via ORAL
  Administered 2014-04-29 (×3): 2 via ORAL
  Filled 2014-04-26 (×3): qty 2
  Filled 2014-04-26: qty 1
  Filled 2014-04-26 (×6): qty 2
  Filled 2014-04-26 (×2): qty 1

## 2014-04-26 NOTE — Progress Notes (Addendum)
[facility] PROGRESS NOTE  Cheri Fowlerlva Gargan ZOX:[id number] DOB: October 17, 1920 DOA: 04/25/2014 PCP:  Duane Lopeoss, Alan, MD  Assessment/Plan: 1. Right femoral neck fracture -Patient is a pleasant 81 year old female who is highly active, resides in the community, having a mechanical fall with lose her balance while getting out of a car. -Labs showing a 9 or of 3.62 for which she was given 5 mg of IV vitamin K with INR trended down to 3.06 on this mornings lab work. Have ordered an additional 5 mg by mouth vitamin K today. Will recheck an INR tomorrow morning -Orthopedic surgery following, plan for ORIF -She is made nothing by mouth after midnight, will provide normal saline at 75 mL/hour.  2.  Atrial fibrillation -Patient remains rate controlled -Her Coumadin has been held for orthopedic repair of right hip fracture. -On presentation she was given 5 mg of IV Vit K, a.m. labs showing an INR of 3.06 for which an additional 5 mg by mouth was administered -Repeat INR in a.m.  3.  Hypertension -Blood pressures are well controlled will continue amlodipine 10 mg by mouth daily  4.  Hypothyroidism. -She had a TSH checked on 03/28/2014 that was within normal limits at 1.148 -Will continue Synthroid 25 g by mouth daily  5.  Urinary tract infection -UA showing the presence of many bacteria with white blood cells -Will start ceftriaxone 1 g IV every 24 hours  Code Status: DO NOT RESUSCITATE Family Communication: I spoke to her daughter was present at bedside Disposition Plan: Plan for orthopedic repair the next 24 hours   Consultants:  Orthopedic surgery   Antibiotics:  Ceftriaxone 1 g IV every 24 hours  HPI/Subjective: Patient is a pleasant 81 year old female is highly functional, having a history of atrial fibrillation on chronic anticoagulation with Coumadin, history of hypertension, gastroesophageal reflux disease, hypothyroidism, who was admitted to medicine service on 04/25/2014 after  having a fall. She had stated getting out of her car in a parking lot losing her balance and falling on the concrete floor. There was no loss of consciousness. She complained of pain. Workup revealed a right femoral neck fracture for which orthopedic surgery was consulted. Labs also showed an INR of 3.62 for which she was given 5 mg of vitamin K. INR came down to 3.06 on the following morning for which an additional 5 mg of vitamin K was given.   Objective: Filed Vitals:   04/26/14 1432  BP: 132/68  Pulse: 67  Temp: 98.3 F (36.8 C)  Resp: 18    Intake/Output Summary (Last 24 hours) at 04/26/14 1559 Last data filed at 04/26/14 1100  Gross per 24 hour  Intake 973.33 ml  Output   1475 ml  Net -501.67 ml   Filed Weights   04/26/14 0200  Weight: 52.8 kg (116 lb 6.5 oz)    Exam:   General:  Patient is awake, alert, no acute distress   Cardiovascular: Regular rate and rhythm normal S1-S2 no murmurs rubs or gallops   Respiratory: Normal as a driver, lungs are clear to auscultation bilaterally  Abdomen: soft nontender nondistended  Musculoskeletal: Having pain with passive and active movement to her right lower extremity.    Data Reviewed: Basic Metabolic Panel:  Recent Labs Lab 04/25/14 2218 04/26/14 0637  NA 135 132*  K 3.7 3.8  CL 100 99  CO2 26 24  GLUCOSE 127* 132*  BUN 16 15  CREATININE 0.72 0.68  CALCIUM 9.3 8.7   Liver Function Tests:  Recent  Labs Lab 04/26/14 0637  AST 35  ALT 25  ALKPHOS 64  BILITOT 0.4  PROT 6.7  ALBUMIN 4.0   No results for input(s): LIPASE, AMYLASE in the last 168 hours. No results for input(s): AMMONIA in the last 168 hours. CBC:  Recent Labs Lab 04/25/14 2218 04/26/14 0637  WBC 14.7* 11.1*  NEUTROABS 13.4* 10.2*  HGB 12.9 12.6  HCT 39.3 38.0  MCV 92.5 92.5  PLT 273 243   Cardiac Enzymes: No results for input(s): CKTOTAL, CKMB, CKMBINDEX, TROPONINI in the last 168 hours. BNP (last 3 results) No results for  input(s): BNP in the last 8760 hours.  ProBNP (last 3 results) No results for input(s): PROBNP in the last 8760 hours.  CBG: No results for input(s): GLUCAP in the last 168 hours.  No results found for this or any previous visit (from the past 240 hour(s)).   Studies: Dg Chest Port 1 View  04/25/2014   CLINICAL DATA:  Larey Seat tonight. History of atrial fibrillation and bradycardia.  EXAM: PORTABLE CHEST - 1 VIEW  COMPARISON:  03/27/2014  FINDINGS: The heart is enlarged but stable. There is tortuosity 10 mild calcification of the thoracic aorta. There are chronic bronchitic type interstitial lung changes but no acute overlying pulmonary process. Mild eventration of both hemidiaphragms. No definite pleural effusions.  IMPRESSION: No acute cardiopulmonary findings.   Electronically Signed   By: Rudie Meyer M.D.   On: 04/25/2014 22:07   Dg Hip Unilat With Pelvis 2-3 Views Right  04/25/2014   CLINICAL DATA:  Larey Seat today in the parking lot and injured right hip.  EXAM: RIGHT HIP (WITH PELVIS) 2-3 VIEWS  COMPARISON:  03/29/2014  FINDINGS: There is a displaced femoral neck fracture with shortening an lateral displacement. The left hip is intact. The pubic symphysis and SI joints are intact. No definite pelvic fractures. Stable bilateral buttock soft tissue calcifications.  IMPRESSION: Displaced right femoral neck fracture.   Electronically Signed   By: Rudie Meyer M.D.   On: 04/25/2014 21:41    Scheduled Meds: . amLODipine  10 mg Oral Daily  . buPROPion  150 mg Oral Daily  . docusate sodium  100 mg Oral BID  . ferrous sulfate  325 mg Oral Q breakfast  . levothyroxine  25 mcg Oral QAC breakfast   Continuous Infusions: . sodium chloride      Principal Problem:   Closed right hip fracture Active Problems:   Essential hypertension   Atrial fibrillation   Hyponatremia   GERD (gastroesophageal reflux disease)   Depression    Time spent: 35 minutes   Jeralyn Bennett  [facility] Pager [phone]. If 7PM-7AM, please contact night-coverage at [email or web address], password [facility] 04/26/2014, 3:59 PM  LOS: 1 day

## 2014-04-26 NOTE — Progress Notes (Signed)
Candace HesselbachMaria is awaiting surgery on hip for tomorrow.  Holding Coumadin and received some vitamin K.  Will be NPO after 12 midnight.  Ordered new PT/INR for 6am.  Planning cemented hemiarthroplasty.  Discussed again with patient.

## 2014-04-26 NOTE — Care Management Note (Signed)
CARE MANAGEMENT NOTE 04/26/2014  Patient:  Candace Cain,Candace Cain   Account Number:  [id number]  Date Initiated:  04/26/2014  Documentation initiated by:  DAVIS,RHONDA  Subjective/Objective Assessment:   pt fell in parking lot on [id number]/sustained rt closed hip fracture/hcx of a.fib and on anticog. therapy for. surgery planned on 041716 after inr within lower level for surg.     Action/Plan:   tbd may need snf short term placement for rehab.   Anticipated DC Date:  04/29/2014   Anticipated DC Plan:    In-house referral  Clinical Social Worker      DC Planning Services  CM consult      [facility]  [facility]   Choice offered to / List presented to:  NA           Status of service:  In process, will continue to follow Medicare Important Message given?   (If response is "NO", the following Medicare IM given date fields will be blank) Date Medicare IM given:   Medicare IM given by:   Date Additional Medicare IM given:   Additional Medicare IM given by:    Discharge Disposition:    Per UR Regulation:  Reviewed for med. necessity/level of care/duration of stay  If discussed at Long Length of Stay Meetings, dates discussed:    Comments:  April 26, 2014/Rhonda L. Earlene Plateravis, RN, BSN, CCM. Case Management [employer] [phone] No discharge needs present of time of review.

## 2014-04-26 NOTE — Consult Note (Signed)
Squire Withey, MD  Mike Carnaghi, PA-C  Andrew Nida, PA-C                                  [facility]                [address], [place], [place]  [address]   ORTHOPAEDIC CONSULTATION  Candace Cain            MRN:  [id number] DOB/SEX:  02/26/1920/female     CHIEF COMPLAINT:  Painful right hip  HISTORY: [name] a 81 y.o. female with recent fall due to shoe issue.  Could not get up and taken to [facility] where xray shows displaced FNFx.  ORS consulted for management.  On coumadin for Afib.  Denies LOC.  Lives independently in retirement community ([facility]) normally with husband but he is currently in higher level of care.  Seen with niece in ED.   PAST MEDICAL HISTORY: Patient Active Problem List   Diagnosis Date Noted  . Closed right hip fracture 04/26/2014  . Hip fracture requiring operative repair 04/25/2014  . Lobar pneumonia due to unspecified organism 03/28/2014  . Dehydration, moderate 03/28/2014  . Hypothyroidism 03/28/2014  . GERD (gastroesophageal reflux disease) 03/28/2014  . Depression 03/28/2014  . Community acquired pneumonia 03/28/2014  . Hemoptysis   . Hyponatremia 03/27/2014  . Long term (current) use of anticoagulants 04/21/2011  . CHEST PAIN 04/22/2008  . Essential hypertension 04/19/2008  . Atrial fibrillation 04/19/2008  . BRADYCARDIA 04/19/2008   Past Medical History  Diagnosis Date  . Bradycardia   . Alopecia   . Unspecified essential hypertension   . Atrial fibrillation    Past Surgical History  Procedure Laterality Date  . Hysterectomy, unspecifed area       MEDICATIONS:   Current facility-administered medications:  .  amLODipine (NORVASC) tablet 10 mg, 10 mg, Oral, Daily, Pranav M Patel, MD .  buPROPion (WELLBUTRIN XL) 24 hr tablet 150 mg, 150 mg, Oral, Daily, Pranav M Patel, MD .  docusate sodium (COLACE) capsule 100 mg, 100 mg, Oral, BID, Pranav M Patel, MD .  ferrous sulfate tablet 325 mg, 325 mg, Oral, Q  breakfast, Pranav M Patel, MD .  HYDROcodone-acetaminophen (NORCO/VICODIN) 5-325 MG per tablet 1-2 tablet, 1-2 tablet, Oral, Q6H PRN, Pranav M Patel, MD .  levothyroxine (SYNTHROID, LEVOTHROID) tablet 25 mcg, 25 mcg, Oral, QAC breakfast, Pranav M Patel, MD .  ondansetron (ZOFRAN) injection 4 mg, 4 mg, Intravenous, Q8H PRN, Melanie Belfi, MD, 4 mg at 04/26/14 0017 .  phytonadione (VITAMIN K) 5 mg in dextrose 5 % 50 mL IVPB, 5 mg, Intravenous, Once, Pranav M Patel, MD .  senna-docusate (Senokot-S) tablet 1 tablet, 1 tablet, Oral, QHS PRN, Pranav M Patel, MD  Current outpatient prescriptions:  .  alum & mag hydroxide-simeth (MAALOX/MYLANTA) 200-200-20 MG/5ML suspension, Take 15 mLs by mouth every 4 (four) hours as needed for indigestion or heartburn., Disp: 355 mL, Rfl: 0 .  amLODipine (NORVASC) 5 MG tablet, Take 10 mg by mouth daily. , Disp: , Rfl:  .  b complex vitamins tablet, Take 1 tablet by mouth daily., Disp: , Rfl:  .  buPROPion (WELLBUTRIN XL) 150 MG 24 hr tablet, Take 150 mg by mouth daily., Disp: , Rfl:  .  Calcium Carbonate-Vitamin D (TH CALCIUM CARBONATE-VITAMIN D) 600-400 MG-UNIT per tablet, Take 2 tablets by mouth daily.  , Disp: , Rfl:  .  fish   oil-omega-3 fatty acids 1000 MG capsule, Take 1 g by mouth 2 (two) times daily. , Disp: , Rfl:  .  levothyroxine (SYNTHROID, LEVOTHROID) 25 MCG tablet, Take 25 mcg by mouth daily before breakfast., Disp: , Rfl:  .  Multiple Vitamin (MULTIVITAMIN) tablet, Take 1 tablet by mouth daily.  , Disp: , Rfl:  .  terbinafine (LAMISIL) 250 MG tablet, Take 250 mg by mouth daily., Disp: , Rfl:  .  warfarin (COUMADIN) 2 MG tablet, Take 2-3 mg by mouth daily. Take 1 tablet (2 mg) by mouth on Monday, Wednesday, Friday and Take 1.5 tablets (3 mg) on Tuesday, Thursday, Saturday and Sunday, Disp: , Rfl:  .  docusate sodium (COLACE) 100 MG capsule, Take 1 capsule (100 mg total) by mouth 2 (two) times daily., Disp: 10 capsule, Rfl: 0 .  isosorbide-hydrALAZINE  (BIDIL) 20-37.5 MG per tablet, Take 1 tablet by mouth 2 (two) times daily., Disp: 60 tablet, Rfl: 0 .  nitroGLYCERIN (NITROSTAT) 0.4 MG SL tablet, Place 0.4 mg under the tongue every 5 (five) minutes as needed.  , Disp: , Rfl:   ALLERGIES:  No Known Allergies  REVIEW OF SYSTEMS: REVIEWED IN DETAIL IN CHART  FAMILY HISTORY:   Family History  Problem Relation Age of Onset  . Cancer      family history of it  . Coronary artery disease      family history of it    SOCIAL HISTORY:   History  Substance Use Topics  . Smoking status: Former Smoker  . Smokeless tobacco: Never Used  . Alcohol Use: Yes     Comment: occasional wine      EXAMINATION: Vital signs in last 24 hours: Temp:  [97.6 F (36.4 C)] 97.6 F (36.4 C) (04/15 1958) Pulse Rate:  [76] 76 (04/15 1958) Resp:  [20] 20 (04/15 1958) BP: (152)/(79) 152/79 mmHg (04/15 1958) SpO2:  [94 %-98 %] 94 % (04/15 1958)  BP 152/79 mmHg  Pulse 76  Temp(Src) 97.6 F (36.4 C)  Resp 20  SpO2 94%  General Appearance:    Alert, cooperative, no distress, appears stated age  Head:    Normocephalic, without obvious abnormality, atraumatic  Eyes:    PERRL, conjunctiva/corneas clear, EOM's intact, fundi    benign, both eyes  Ears:    Normal TM's and external ear canals, both ears  Nose:   Nares normal, septum midline, mucosa normal, no drainage    or sinus tenderness  Throat:   Lips, mucosa, and tongue normal; teeth and gums normal  Neck:   Supple, symmetrical, trachea midline, no adenopathy;    thyroid:  no enlargement/tenderness/nodules; no carotid   bruit or JVD  Back:     Symmetric, no curvature, ROM normal, no CVA tenderness  Lungs:     Clear to auscultation bilaterally, respirations unlabored  Chest Wall:    No tenderness or deformity   Heart:    Irregular rate and rhythm, S1 and S2 normal, no murmur, rub   or gallop  Breast Exam:      Abdomen:     Soft, non-tender, bowel sounds active all four quadrants,    no masses, no  organomegaly  Genitalia:      Rectal:        Pulses:   2+ and symmetric all extremities  Skin:   Skin color, texture, turgor normal, no rashes or lesions  Lymph nodes:   Cervical, supraclavicular, and axillary nodes normal  Neurologic:   CNII-XII intact, normal strength, sensation   and reflexes    throughout    Musculoskeletal Exam:   Right leg SER and painful to ROM, other 3 ext FROM   DIAGNOSTIC STUDIES: Recent laboratory studies:  Recent Labs  04/25/14 2218  WBC 14.7*  HGB 12.9  HCT 39.3  PLT 273    Recent Labs  04/25/14 2218  NA 135  K 3.7  CL 100  CO2 26  BUN 16  CREATININE 0.72  GLUCOSE 127*  CALCIUM 9.3   Lab Results  Component Value Date   INR 3.62* 04/25/2014   INR 2.74* 04/01/2014   INR 2.12* 03/31/2014     Recent Radiographic Studies :  Dg Chest 2 View  03/27/2014   CLINICAL DATA:  Productive cough  EXAM: CHEST  2 VIEW  COMPARISON:  03/26/2014  FINDINGS: Cardiac shadow is stable. The lungs are well aerated bilaterally. Increased density is noted in the posterior costophrenic angle on right consistent with an early infiltrate. This was not well appreciated on the prior exam. No acute bony abnormality is seen.  IMPRESSION: Early infiltrate in the right lung base posteriorly.   Electronically Signed   By: Mark  Lukens M.D.   On: 03/27/2014 21:55   Dg Chest Port 1 View  04/25/2014   CLINICAL DATA:  Fell tonight. History of atrial fibrillation and bradycardia.  EXAM: PORTABLE CHEST - 1 VIEW  COMPARISON:  03/27/2014  FINDINGS: The heart is enlarged but stable. There is tortuosity 10 mild calcification of the thoracic aorta. There are chronic bronchitic type interstitial lung changes but no acute overlying pulmonary process. Mild eventration of both hemidiaphragms. No definite pleural effusions.  IMPRESSION: No acute cardiopulmonary findings.   Electronically Signed   By: P.  Gallerani M.D.   On: 04/25/2014 22:07   Dg Abd Portable 1v  03/29/2014   CLINICAL DATA:   Initial evaluation left-sided abdominal pain for 1 month with nausea  EXAM: PORTABLE ABDOMEN - 1 VIEW  COMPARISON:  None.  FINDINGS: There are no abnormally dilated loops of bowel. There is stool throughout the entire colon. There is a 16 mm focus of calcification in the left lower quadrant. There are no abnormally dilated loops of bowel. There are a few nonspecific calcifications in the left upper quadrant.  IMPRESSION: 1. Constipation 2. Calcification left lower quadrant, nonspecific. This could represent calcification in an ovarian dermoid, or dense material in a diverticulum.   Electronically Signed   By: Raymond  Rubner M.D.   On: 03/29/2014 10:48   Dg Hip Unilat With Pelvis 2-3 Views Right  04/25/2014   CLINICAL DATA:  Fell today in the parking lot and injured right hip.  EXAM: RIGHT HIP (WITH PELVIS) 2-3 VIEWS  COMPARISON:  03/29/2014  FINDINGS: There is a displaced femoral neck fracture with shortening an lateral displacement. The left hip is intact. The pubic symphysis and SI joints are intact. No definite pelvic fractures. Stable bilateral buttock soft tissue calcifications.  IMPRESSION: Displaced right femoral neck fracture.   Electronically Signed   By: P.  Gallerani M.D.   On: 04/25/2014 21:41    ASSESSMENT:  Right displaced femoral neck fracture   PLAN:  Needs hemi hip.  Relatively active 81 yo lady who exercises regularly.  Medical team has seen and will reverse anticoag safely.  Plan on hemi hip Sunday morning if all goes well.  Discussed with patient and niece.  Shaquasia Caponigro G 04/26/2014, 12:26 AM    

## 2014-04-27 ENCOUNTER — Encounter (HOSPITAL_COMMUNITY): Payer: Self-pay | Admitting: Anesthesiology

## 2014-04-27 ENCOUNTER — Inpatient Hospital Stay (HOSPITAL_COMMUNITY): Payer: Medicare Other

## 2014-04-27 ENCOUNTER — Inpatient Hospital Stay (HOSPITAL_COMMUNITY): Payer: Medicare Other | Admitting: Anesthesiology

## 2014-04-27 ENCOUNTER — Encounter (HOSPITAL_COMMUNITY)
Admission: EM | Disposition: A | Discharge status: Skilled Nursing Facility | Payer: Self-pay | Source: Home / Self Care | Attending: Internal Medicine

## 2014-04-27 DIAGNOSIS — N3000 Acute cystitis without hematuria: Secondary | ICD-10-CM

## 2014-04-27 HISTORY — PX: HIP ARTHROPLASTY: SHX981

## 2014-04-27 LAB — CBC
HCT: 38.5 % (ref 36.0–46.0)
HEMOGLOBIN: 12.6 g/dL (ref 12.0–15.0)
MCH: 30.7 pg (ref 26.0–34.0)
MCHC: 32.7 g/dL (ref 30.0–36.0)
MCV: 93.7 fL (ref 78.0–100.0)
Platelets: 261 10*3/uL (ref 150–400)
RBC: 4.11 MIL/uL (ref 3.87–5.11)
RDW: 13.8 % (ref 11.5–15.5)
WBC: 9.5 10*3/uL (ref 4.0–10.5)

## 2014-04-27 LAB — BASIC METABOLIC PANEL
Anion gap: 6 (ref 5–15)
BUN: 17 mg/dL (ref 6–23)
CALCIUM: 8.6 mg/dL (ref 8.4–10.5)
CO2: 25 mmol/L (ref 19–32)
CREATININE: 0.77 mg/dL (ref 0.50–1.10)
Chloride: 101 mmol/L (ref 96–112)
GFR calc non Af Amer: 71 mL/min — ABNORMAL LOW (ref >90)
GFR, EST AFRICAN AMERICAN: 83 mL/min — AB (ref >90)
Glucose, Bld: 104 mg/dL — ABNORMAL HIGH (ref 70–99)
Potassium: 3.7 mmol/L (ref 3.5–5.1)
SODIUM: 132 mmol/L — AB (ref 135–145)

## 2014-04-27 LAB — PROTIME-INR
INR: 1.07 (ref 0.00–1.49)
Prothrombin Time: 14.1 seconds (ref 11.6–15.2)

## 2014-04-27 SURGERY — HEMIARTHROPLASTY, HIP, DIRECT ANTERIOR APPROACH, FOR FRACTURE
Anesthesia: General | Site: Hip | Laterality: Right

## 2014-04-27 MED ORDER — CEFAZOLIN SODIUM-DEXTROSE 2-3 GM-% IV SOLR
INTRAVENOUS | Status: AC
  Filled 2014-04-27: qty 50

## 2014-04-27 MED ORDER — FENTANYL CITRATE (PF) 100 MCG/2ML IJ SOLN
INTRAMUSCULAR | Status: DC | PRN
  Administered 2014-04-27 (×4): 50 ug via INTRAVENOUS

## 2014-04-27 MED ORDER — ONDANSETRON HCL 4 MG/2ML IJ SOLN
INTRAMUSCULAR | Status: AC
  Filled 2014-04-27: qty 2

## 2014-04-27 MED ORDER — ACETAMINOPHEN 650 MG RE SUPP: 650.0000 mg | Freq: Four times a day (QID) | RECTAL | Status: DC | PRN

## 2014-04-27 MED ORDER — FENTANYL CITRATE (PF) 250 MCG/5ML IJ SOLN
INTRAMUSCULAR | Status: AC
  Filled 2014-04-27: qty 5

## 2014-04-27 MED ORDER — CEFAZOLIN SODIUM-DEXTROSE 2-3 GM-% IV SOLR: 2.0000 g | Freq: Four times a day (QID) | INTRAVENOUS | Status: DC

## 2014-04-27 MED ORDER — CEFTRIAXONE SODIUM IN DEXTROSE 20 MG/ML IV SOLN: 1.0000 g | INTRAVENOUS | Status: DC

## 2014-04-27 MED ORDER — EPHEDRINE SULFATE 50 MG/ML IJ SOLN
INTRAMUSCULAR | Status: AC
  Filled 2014-04-27: qty 1

## 2014-04-27 MED ORDER — ENOXAPARIN SODIUM 40 MG/0.4ML ~~LOC~~ SOLN
40.0000 mg | SUBCUTANEOUS | Status: DC
  Administered 2014-04-28 – 2014-04-29 (×2): 40 mg via SUBCUTANEOUS
  Filled 2014-04-27 (×3): qty 0.4

## 2014-04-27 MED ORDER — DEXAMETHASONE SODIUM PHOSPHATE 10 MG/ML IJ SOLN
INTRAMUSCULAR | Status: AC
  Filled 2014-04-27: qty 1

## 2014-04-27 MED ORDER — METOCLOPRAMIDE HCL 5 MG/ML IJ SOLN: 5.0000 mg | Freq: Three times a day (TID) | INTRAMUSCULAR | Status: DC | PRN

## 2014-04-27 MED ORDER — LACTATED RINGERS IV SOLN
INTRAVENOUS | Status: DC | PRN
  Administered 2014-04-27: 10:00:00 via INTRAVENOUS

## 2014-04-27 MED ORDER — MENTHOL 3 MG MT LOZG
1.0000 | LOZENGE | OROMUCOSAL | Status: DC | PRN
  Filled 2014-04-27: qty 9

## 2014-04-27 MED ORDER — EPHEDRINE SULFATE 50 MG/ML IJ SOLN
INTRAMUSCULAR | Status: DC | PRN
  Administered 2014-04-27: 5 mg via INTRAVENOUS

## 2014-04-27 MED ORDER — HYDROMORPHONE HCL 1 MG/ML IJ SOLN
INTRAMUSCULAR | Status: AC
  Filled 2014-04-27: qty 1

## 2014-04-27 MED ORDER — DEXAMETHASONE SODIUM PHOSPHATE 10 MG/ML IJ SOLN
INTRAMUSCULAR | Status: DC | PRN
  Administered 2014-04-27: 10 mg via INTRAVENOUS

## 2014-04-27 MED ORDER — WARFARIN SODIUM 4 MG PO TABS
4.0000 mg | ORAL_TABLET | Freq: Once | ORAL | Status: AC
  Administered 2014-04-27: 4 mg via ORAL
  Filled 2014-04-27 (×2): qty 1

## 2014-04-27 MED ORDER — PROPOFOL 10 MG/ML IV BOLUS
INTRAVENOUS | Status: AC
  Filled 2014-04-27: qty 20

## 2014-04-27 MED ORDER — ACETAMINOPHEN 325 MG PO TABS: 650.0000 mg | ORAL_TABLET | Freq: Four times a day (QID) | ORAL | Status: DC | PRN

## 2014-04-27 MED ORDER — PHENYLEPHRINE HCL 10 MG/ML IJ SOLN
INTRAMUSCULAR | Status: DC | PRN
  Administered 2014-04-27: 40 ug via INTRAVENOUS

## 2014-04-27 MED ORDER — SODIUM CHLORIDE 0.9 % IR SOLN
Status: DC | PRN
  Administered 2014-04-27: 1000 mL

## 2014-04-27 MED ORDER — FENTANYL CITRATE (PF) 100 MCG/2ML IJ SOLN
INTRAMUSCULAR | Status: AC
  Filled 2014-04-27: qty 2

## 2014-04-27 MED ORDER — WARFARIN - PHARMACIST DOSING INPATIENT: Freq: Every day | Status: DC

## 2014-04-27 MED ORDER — ONDANSETRON HCL 4 MG/2ML IJ SOLN
INTRAMUSCULAR | Status: DC | PRN
  Administered 2014-04-27 (×2): 1 mg via INTRAVENOUS

## 2014-04-27 MED ORDER — ONDANSETRON HCL 4 MG/2ML IJ SOLN
4.0000 mg | Freq: Four times a day (QID) | INTRAMUSCULAR | Status: DC | PRN
  Administered 2014-04-27 – 2014-04-28 (×2): 4 mg via INTRAVENOUS
  Filled 2014-04-27 (×2): qty 2

## 2014-04-27 MED ORDER — ATROPINE SULFATE 0.4 MG/ML IJ SOLN
INTRAMUSCULAR | Status: AC
  Filled 2014-04-27: qty 1

## 2014-04-27 MED ORDER — METOCLOPRAMIDE HCL 10 MG PO TABS: 5.0000 mg | ORAL_TABLET | Freq: Three times a day (TID) | ORAL | Status: DC | PRN

## 2014-04-27 MED ORDER — LIDOCAINE HCL (CARDIAC) 20 MG/ML IV SOLN
INTRAVENOUS | Status: DC | PRN
  Administered 2014-04-27: 75 mg via INTRAVENOUS

## 2014-04-27 MED ORDER — CEFAZOLIN SODIUM-DEXTROSE 2-3 GM-% IV SOLR
INTRAVENOUS | Status: DC | PRN
  Administered 2014-04-27: 2 g via INTRAVENOUS

## 2014-04-27 MED ORDER — SODIUM CHLORIDE 0.9 % IJ SOLN
INTRAMUSCULAR | Status: AC
  Filled 2014-04-27: qty 10

## 2014-04-27 MED ORDER — 0.9 % SODIUM CHLORIDE (POUR BTL) OPTIME
TOPICAL | Status: DC | PRN
  Administered 2014-04-27: 1000 mL

## 2014-04-27 MED ORDER — HYDROMORPHONE HCL 1 MG/ML IJ SOLN
0.5000 mg | INTRAMUSCULAR | Status: DC | PRN
  Administered 2014-04-27: 1 mg via INTRAVENOUS

## 2014-04-27 MED ORDER — PROPOFOL 10 MG/ML IV BOLUS
INTRAVENOUS | Status: DC | PRN
  Administered 2014-04-27: 40 mg via INTRAVENOUS
  Administered 2014-04-27: 125 mg via INTRAVENOUS

## 2014-04-27 MED ORDER — CEFUROXIME AXETIL 250 MG PO TABS
250.0000 mg | ORAL_TABLET | Freq: Two times a day (BID) | ORAL | Status: DC
  Administered 2014-04-28 – 2014-04-29 (×3): 250 mg via ORAL
  Filled 2014-04-27 (×5): qty 1

## 2014-04-27 MED ORDER — FENTANYL CITRATE (PF) 100 MCG/2ML IJ SOLN
25.0000 ug | INTRAMUSCULAR | Status: DC | PRN
  Administered 2014-04-27 (×2): 50 ug via INTRAVENOUS

## 2014-04-27 MED ORDER — SUCCINYLCHOLINE CHLORIDE 20 MG/ML IJ SOLN
INTRAMUSCULAR | Status: DC | PRN
  Administered 2014-04-27: 100 mg via INTRAVENOUS

## 2014-04-27 MED ORDER — CEFAZOLIN SODIUM 1-5 GM-% IV SOLN
1.0000 g | Freq: Four times a day (QID) | INTRAVENOUS | Status: AC
  Administered 2014-04-27 (×2): 1 g via INTRAVENOUS
  Filled 2014-04-27 (×2): qty 50

## 2014-04-27 MED ORDER — ONDANSETRON HCL 4 MG PO TABS: 4.0000 mg | ORAL_TABLET | Freq: Four times a day (QID) | ORAL | Status: DC | PRN

## 2014-04-27 MED ORDER — PHENYLEPHRINE HCL 10 MG/ML IJ SOLN
INTRAMUSCULAR | Status: AC
  Filled 2014-04-27: qty 1

## 2014-04-27 MED ORDER — LIDOCAINE HCL (CARDIAC) 20 MG/ML IV SOLN
INTRAVENOUS | Status: AC
  Filled 2014-04-27: qty 5

## 2014-04-27 MED ORDER — PHENOL 1.4 % MT LIQD: 1.0000 | OROMUCOSAL | Status: DC | PRN

## 2014-04-27 SURGICAL SUPPLY — 49 items
BAG SPEC THK2 15X12 ZIP CLS (MISCELLANEOUS) ×1
BAG ZIPLOCK 12X15 (MISCELLANEOUS) ×2 IMPLANT
BLADE SAW SAG 73X25 THK (BLADE) ×1
BLADE SAW SGTL 73X25 THK (BLADE) ×1 IMPLANT
BRUSH FEMORAL CANAL (MISCELLANEOUS) ×2 IMPLANT
CAPT HIP HEMI 1 ×1 IMPLANT
CEMENT BONE DEPUY (Cement) ×4 IMPLANT
COVER SURGICAL LIGHT HANDLE (MISCELLANEOUS) ×2 IMPLANT
DRAPE INCISE IOBAN 66X45 STRL (DRAPES) ×2 IMPLANT
DRAPE ORTHO SPLIT 77X108 STRL (DRAPES) ×4
DRAPE POUCH INSTRU U-SHP 10X18 (DRAPES) ×2 IMPLANT
DRAPE SURG ORHT 6 SPLT 77X108 (DRAPES) ×2 IMPLANT
DRAPE U-SHAPE 47X51 STRL (DRAPES) ×2 IMPLANT
DRAPE WARM FLUID 44X44 (DRAPE) ×1 IMPLANT
DRSG AQUACEL AG ADV 3.5X10 (GAUZE/BANDAGES/DRESSINGS) ×1 IMPLANT
DRSG EMULSION OIL 3X16 NADH (GAUZE/BANDAGES/DRESSINGS) ×2 IMPLANT
DRSG PAD ABDOMINAL 8X10 ST (GAUZE/BANDAGES/DRESSINGS) ×4 IMPLANT
ELECT REM PT RETURN 9FT ADLT (ELECTROSURGICAL) ×2
ELECTRODE REM PT RTRN 9FT ADLT (ELECTROSURGICAL) ×1 IMPLANT
EVACUATOR 1/8 PVC DRAIN (DRAIN) ×2 IMPLANT
FACESHIELD WRAPAROUND (MASK) ×6 IMPLANT
FACESHIELD WRAPAROUND OR TEAM (MASK) ×3 IMPLANT
GAUZE SPONGE 4X4 12PLY STRL (GAUZE/BANDAGES/DRESSINGS) ×4 IMPLANT
GLOVE BIO SURGEON STRL SZ8.5 (GLOVE) ×2 IMPLANT
HANDPIECE INTERPULSE COAX TIP (DISPOSABLE) ×2
HOOD PEEL AWAY FACE SHEILD DIS (HOOD) ×4 IMPLANT
IMMOBILIZER KNEE 20 (SOFTGOODS)
IMMOBILIZER KNEE 20 THIGH 36 (SOFTGOODS) IMPLANT
KIT BASIN OR (CUSTOM PROCEDURE TRAY) ×2 IMPLANT
NDL MA TROC 1/2 (NEEDLE) ×1 IMPLANT
NDL MAYO 6 CRC TAPER PT (NEEDLE) IMPLANT
NEEDLE MA TROC 1/2 (NEEDLE) ×2 IMPLANT
NEEDLE MAYO 6 CRC TAPER PT (NEEDLE) ×2 IMPLANT
PACK TOTAL JOINT (CUSTOM PROCEDURE TRAY) ×2 IMPLANT
PASSER SUT SWANSON 36MM LOOP (INSTRUMENTS) ×2 IMPLANT
POSITIONER SURGICAL ARM (MISCELLANEOUS) ×2 IMPLANT
RESTRICTOR CEMENT PE SZ 2 (Cement) ×1 IMPLANT
SET HNDPC FAN SPRY TIP SCT (DISPOSABLE) ×1 IMPLANT
STAPLER VISISTAT 35W (STAPLE) ×2 IMPLANT
STRIP CLOSURE SKIN 1/2X4 (GAUZE/BANDAGES/DRESSINGS) ×4 IMPLANT
SUT ETHIBOND NAB CT1 #1 30IN (SUTURE) ×8 IMPLANT
SUT MNCRL AB 4-0 PS2 18 (SUTURE) ×2 IMPLANT
SUT VIC AB 1 CT1 27 (SUTURE) ×6
SUT VIC AB 1 CT1 27XBRD ANTBC (SUTURE) ×3 IMPLANT
SUT VIC AB 2-0 CT1 27 (SUTURE) ×6
SUT VIC AB 2-0 CT1 TAPERPNT 27 (SUTURE) ×3 IMPLANT
TOWEL OR 17X26 10 PK STRL BLUE (TOWEL DISPOSABLE) ×3 IMPLANT
TOWER CARTRIDGE SMART MIX (DISPOSABLE) ×2 IMPLANT
TRAY FOLEY W/METER SILVER 14FR (SET/KITS/TRAYS/PACK) ×2 IMPLANT

## 2014-04-27 NOTE — Progress Notes (Signed)
[facility] PROGRESS NOTE  Candace Cain RUE:[id number] DOB: [birth year] DOA: 04/25/2014 PCP:  Duane Lope, MD  Assessment/Plan: 1. Right femoral neck fracture -Patient is a pleasant 81 year old female who is highly active, resides in the community, having a mechanical fall with lose Candace balance while getting out of a car. -Patient taken to the OR today where she had what arthroplasty of right hip fracture, tolerated procedure well no immediate complications -Restarting Coumadin  2.  Atrial fibrillation -Patient remains rate controlled, having heart rates in the 80s. -Pharmacy consulted, restarting Coumadin therapy. This morning lab showed an INR of 1.07  3.  Hypertension -Blood pressures are well controlled will continue amlodipine 10 mg by mouth daily  4.  Hypothyroidism. -She had a TSH checked on 03/28/2014 that was within normal limits at 1.148 -Will continue Synthroid 25 g by mouth daily  5.  Urinary tract infection -Will transition to oral Ceftin, discontinue ceftriaxone as she is afebrile and tolerating by mouth intake.  Code Status: DO NOT RESUSCITATE Family Communication: I spoke to Candace Cain was present at bedside Disposition Plan: Plan for orthopedic repair the next 24 hours   Consultants:  Orthopedic surgery   Antibiotics:  Ceftriaxone 1 g IV every 24 hours discontinued 04/27/2014  Ceftin 250 g by mouth twice a day started on 04/27/2014  HPI/Subjective: Patient is a pleasant 81 year old female is highly functional, having a history of atrial fibrillation on chronic anticoagulation with Coumadin, history of hypertension, gastroesophageal reflux disease, hypothyroidism, who was admitted to medicine service on 04/25/2014 after having a fall. She had stated getting out of Candace car in a parking lot losing Candace balance and falling on the concrete floor. There was no loss of consciousness. She complained of pain. Workup revealed a right femoral neck fracture for  which orthopedic surgery was consulted. Labs also showed an INR of 3.62 for which she was given 5 mg of vitamin K. INR came down to 3.06 on the following morning for which an additional 5 mg of vitamin K was given.   Objective: Filed Vitals:   04/27/14 1656  BP: 112/75  Pulse: 82  Temp: 98.6 F (37 C)  Resp: 16    Intake/Output Summary (Last 24 hours) at 04/27/14 1700 Last data filed at 04/27/14 1550  Gross per 24 hour  Intake   2880 ml  Output   2150 ml  Net    730 ml   Filed Weights   04/26/14 0200  Weight: 52.8 kg (116 lb 6.5 oz)    Exam:   General:  Patient is awake, alert, no acute distress   Cardiovascular: Regular rate and rhythm normal S1-S2 no murmurs rubs or gallops   Respiratory: Normal as a driver, lungs are clear to auscultation bilaterally  Abdomen: soft nontender nondistended  Musculoskeletal: Right hip inspected, she does not have evidence of bleed, surgical incision site covered bandage    Data Reviewed: Basic Metabolic Panel:  Recent Labs Lab 04/25/14 2218 04/26/14 0637 04/27/14 0535  NA 135 132* 132*  K 3.7 3.8 3.7  CL 100 99 101  CO2 GLUCOSE 127* 132* 104*  BUN CREATININE 0.72 0.68 0.77  CALCIUM 9.3 8.7 8.6   Liver Function Tests:  Recent Labs Lab 04/26/14 0637  AST 35  ALT 25  ALKPHOS 64  BILITOT 0.4  PROT 6.7  ALBUMIN 4.0   No results for input(s): LIPASE, AMYLASE in the last 168 hours. No results for input(s): AMMONIA in  the last 168 hours. CBC:  Recent Labs Lab 04/25/14 2218 04/26/14 0637 04/27/14 0535  WBC 14.7* 11.1* 9.5  NEUTROABS 13.4* 10.2*  --   HGB 12.9 12.6 12.6  HCT 39.3 38.0 38.5  MCV 92.5 92.5 93.7  PLT 273 243 261   Cardiac Enzymes: No results for input(s): CKTOTAL, CKMB, CKMBINDEX, TROPONINI in the last 168 hours. BNP (last 3 results) No results for input(s): BNP in the last 8760 hours.  ProBNP (last 3 results) No results for input(s): PROBNP in the last 8760  hours.  CBG: No results for input(s): GLUCAP in the last 168 hours.  Recent Results (from the past 240 hour(s))  Surgical pcr screen     Status: None   Collection Time: 04/26/14  5:28 PM  Result Value Ref Range Status   MRSA, PCR NEGATIVE NEGATIVE Final   Staphylococcus aureus NEGATIVE NEGATIVE Final    Comment:        The Xpert SA Assay (FDA approved for NASAL specimens in patients over 79 years of age), is one component of a comprehensive surveillance program.  Test performance has been validated by [facility] for patients greater than or equal to 79 year old. It is not intended to diagnose infection nor to guide or monitor treatment.      Studies: Pelvis Portable  04/27/2014   CLINICAL DATA:  Status post right hip replacement.  EXAM: PORTABLE PELVIS 1-2 VIEWS  COMPARISON:  Abdomen radiographs dated 03/29/2014.  FINDINGS: Right hip prosthesis in satisfactory position and alignment. No fracture or dislocation seen. Calcifications are again noted overlying the pelvis bilaterally. These are projected in different positions than previously. These may represent calcified injection granulomata.  IMPRESSION: Satisfactory postoperative appearance of a right hip prosthesis.   Electronically Signed   By: Beckie SaltsSteven  Reid M.D.   On: 04/27/2014 12:24   Dg Chest Port 1 View  04/25/2014   CLINICAL DATA:  Larey SeatFell tonight. History of atrial fibrillation and bradycardia.  EXAM: PORTABLE CHEST - 1 VIEW  COMPARISON:  03/27/2014  FINDINGS: The heart is enlarged but stable. There is tortuosity 10 mild calcification of the thoracic aorta. There are chronic bronchitic type interstitial lung changes but no acute overlying pulmonary process. Mild eventration of both hemidiaphragms. No definite pleural effusions.  IMPRESSION: No acute cardiopulmonary findings.   Electronically Signed   By: Rudie MeyerP.  Gallerani M.D.   On: 04/25/2014 22:07   Dg Hip Unilat With Pelvis 2-3 Views Right  04/25/2014   CLINICAL DATA:  Larey SeatFell  today in the parking lot and injured right hip.  EXAM: RIGHT HIP (WITH PELVIS) 2-3 VIEWS  COMPARISON:  03/29/2014  FINDINGS: There is a displaced femoral neck fracture with shortening an lateral displacement. The left hip is intact. The pubic symphysis and SI joints are intact. No definite pelvic fractures. Stable bilateral buttock soft tissue calcifications.  IMPRESSION: Displaced right femoral neck fracture.   Electronically Signed   By: Rudie MeyerP.  Gallerani M.D.   On: 04/25/2014 21:41    Scheduled Meds: . amLODipine  10 mg Oral Daily  . buPROPion  150 mg Oral Daily  .  ceFAZolin (ANCEF) IV  1 g Intravenous Q6H  . [START ON 04/28/2014] cefTRIAXone (ROCEPHIN)  IV  1 g Intravenous Q24H  . docusate sodium  100 mg Oral BID  . [START ON 04/28/2014] enoxaparin (LOVENOX) injection  40 mg Subcutaneous Q24H  . fentaNYL      . ferrous sulfate  325 mg Oral Q breakfast  . HYDROmorphone      .  levothyroxine  25 mcg Oral QAC breakfast  . warfarin  4 mg Oral ONCE-1800  . Warfarin - Pharmacist Dosing Inpatient   Does not apply q1800   Continuous Infusions: . sodium chloride 75 mL/hr at 04/27/14 1315    Principal Problem:   Closed right hip fracture Active Problems:   Essential hypertension   Atrial fibrillation   Hyponatremia   GERD (gastroesophageal reflux disease)   Depression    Time spent: 35 minutes   Jeralyn Bennett  [facility] Pager [phone]. If 7PM-7AM, please contact night-coverage at [email or web address], password [facility] 04/27/2014, 5:00 PM  LOS: 2 days

## 2014-04-27 NOTE — H&P (View-Only) (Signed)
Marcene CorningPeter Kaimani Clayson, MD  Bryna ColanderMike Carnaghi, PA-C  Elodia FlorenceAndrew Nida, PA-C                                  [facility]                [address], [place], [place]  [id number]   ORTHOPAEDIC Margarita MailCONSULTATION  [name]            MRN:  [id number] DOB/SEX:  1920-07-06/female     CHIEF COMPLAINT:  Painful right hip  HISTORY: Candace Cain a 81 y.o. female with recent fall due to shoe issue.  Could not get up and [facility] shows displaced FNFx.  ORS consulted for management.  On coumadin for Afib.  Denies LOC.  Lives independently in retirement community [facility] normally with husband but he is currently in higher level of care.  Seen with niece in ED.   PAST MEDICAL HISTORY: Patient Active Problem List   Diagnosis Date Noted  . Closed right hip fracture 04/26/2014  . Hip fracture requiring operative repair 04/25/2014  . Lobar pneumonia due to unspecified organism 03/28/2014  . Dehydration, moderate 03/28/2014  . Hypothyroidism 03/28/2014  . GERD (gastroesophageal reflux disease) 03/28/2014  . Depression 03/28/2014  . Community acquired pneumonia 03/28/2014  . Hemoptysis   . Hyponatremia 03/27/2014  . Long term (current) use of anticoagulants 04/21/2011  . CHEST PAIN 04/22/2008  . Essential hypertension 04/19/2008  . Atrial fibrillation 04/19/2008  . BRADYCARDIA 04/19/2008   Past Medical History  Diagnosis Date  . Bradycardia   . Alopecia   . Unspecified essential hypertension   . Atrial fibrillation    Past Surgical History  Procedure Laterality Date  . Hysterectomy, unspecifed area       MEDICATIONS:   Current facility-administered medications:  .  amLODipine (NORVASC) tablet 10 mg, 10 mg, Oral, Daily, Rolly SalterPranav M Patel, MD .  buPROPion (WELLBUTRIN XL) 24 hr tablet 150 mg, 150 mg, Oral, Daily, Rolly SalterPranav M Patel, MD .  docusate sodium (COLACE) capsule 100 mg, 100 mg, Oral, BID, Rolly SalterPranav M Patel, MD .  ferrous sulfate tablet 325 mg, 325 mg, Oral, Q  breakfast, Rolly SalterPranav M Patel, MD .  HYDROcodone-acetaminophen (NORCO/VICODIN) 5-325 MG per tablet 1-2 tablet, 1-2 tablet, Oral, Q6H PRN, Rolly SalterPranav M Patel, MD .  levothyroxine (SYNTHROID, LEVOTHROID) tablet 25 mcg, 25 mcg, Oral, QAC breakfast, Rolly SalterPranav M Patel, MD .  ondansetron [facility](ZOFRAN) injection 4 mg, 4 mg, Intravenous, Q8H PRN, Rolan BuccoMelanie Belfi, MD, 4 mg at 04/26/14 0017 .  phytonadione (VITAMIN K) 5 mg in dextrose 5 % 50 mL IVPB, 5 mg, Intravenous, Once, Rolly SalterPranav M Patel, MD .  senna-docusate (Senokot-S) tablet 1 tablet, 1 tablet, Oral, QHS PRN, Rolly SalterPranav M Patel, MD  Current outpatient prescriptions:  .  alum & mag hydroxide-simeth (MAALOX/MYLANTA) 200-200-20 MG/5ML suspension, Take 15 mLs by mouth every 4 (four) hours as needed for indigestion or heartburn., Disp: 355 mL, Rfl: 0 .  amLODipine (NORVASC) 5 MG tablet, Take 10 mg by mouth daily. , Disp: , Rfl:  .  b complex vitamins tablet, Take 1 tablet by mouth daily., Disp: , Rfl:  .  buPROPion (WELLBUTRIN XL) 150 MG 24 hr tablet, Take 150 mg by mouth daily., Disp: , Rfl:  .  Calcium Carbonate-Vitamin D (TH CALCIUM CARBONATE-VITAMIN D) 600-400 MG-UNIT per tablet, Take 2 tablets by mouth daily.  , Disp: , Rfl:  .  fish  oil-omega-3 fatty acids 1000 MG capsule, Take 1 g by mouth 2 (two) times daily. , Disp: , Rfl:  .  levothyroxine (SYNTHROID, LEVOTHROID) 25 MCG tablet, Take 25 mcg by mouth daily before breakfast., Disp: , Rfl:  .  Multiple Vitamin (MULTIVITAMIN) tablet, Take 1 tablet by mouth daily.  , Disp: , Rfl:  .  terbinafine (LAMISIL) 250 MG tablet, Take 250 mg by mouth daily., Disp: , Rfl:  .  warfarin (COUMADIN) 2 MG tablet, Take 2-3 mg by mouth daily. Take 1 tablet (2 mg) by mouth on Monday, Wednesday, Friday and Take 1.5 tablets (3 mg) on Tuesday, Thursday, Saturday and Sunday, Disp: , Rfl:  .  docusate sodium (COLACE) 100 MG capsule, Take 1 capsule (100 mg total) by mouth 2 (two) times daily., Disp: 10 capsule, Rfl: 0 .  isosorbide-hydrALAZINE  (BIDIL) 20-37.5 MG per tablet, Take 1 tablet by mouth 2 (two) times daily., Disp: 60 tablet, Rfl: 0 .  nitroGLYCERIN (NITROSTAT) 0.4 MG SL tablet, Place 0.4 mg under the tongue every 5 (five) minutes as needed.  , Disp: , Rfl:   ALLERGIES:  No Known Allergies  REVIEW OF SYSTEMS: REVIEWED IN DETAIL IN CHART  FAMILY HISTORY:   Family History  Problem Relation Age of Onset  . Cancer      family history of it  . Coronary artery disease      family history of it    SOCIAL HISTORY:   History  Substance Use Topics  . Smoking status: Former Games developer  . Smokeless tobacco: Never Used  . Alcohol Use: Yes     Comment: occasional wine      EXAMINATION: Vital signs in last 24 hours: Temp:  [97.6 F (36.4 C)] 97.6 F (36.4 C) (04/15 1958) Pulse Rate:  [76] 76 (04/15 1958) Resp:  [20] 20 (04/15 1958) BP: (152)/(79) 152/79 mmHg (04/15 1958) SpO2:  [94 %-98 %] 94 % (04/15 1958)  BP 152/79 mmHg  Pulse 76  Temp(Src) 97.6 F (36.4 C)  Resp 20  SpO2 94%  General Appearance:    Alert, cooperative, no distress, appears stated age  Head:    Normocephalic, without obvious abnormality, atraumatic  Eyes:    PERRL, conjunctiva/corneas clear, EOM's intact, fundi    benign, both eyes  Ears:    Normal TM's and external ear canals, both ears  Nose:   Nares normal, septum midline, mucosa normal, no drainage    or sinus tenderness  Throat:   Lips, mucosa, and tongue normal; teeth and gums normal  Neck:   Supple, symmetrical, trachea midline, no adenopathy;    thyroid:  no enlargement/tenderness/nodules; no carotid   bruit or JVD  Back:     Symmetric, no curvature, ROM normal, no CVA tenderness  Lungs:     Clear to auscultation bilaterally, respirations unlabored  Chest Wall:    No tenderness or deformity   Heart:    Irregular rate and rhythm, S1 and S2 normal, no murmur, rub   or gallop  Breast Exam:      Abdomen:     Soft, non-tender, bowel sounds active all four quadrants,    no masses, no  organomegaly  Genitalia:      Rectal:        Pulses:   2+ and symmetric all extremities  Skin:   Skin color, texture, turgor normal, no rashes or lesions  Lymph nodes:   Cervical, supraclavicular, and axillary nodes normal  Neurologic:   CNII-XII intact, normal strength, sensation  and reflexes    throughout    Musculoskeletal Exam:   Right leg SER and painful to ROM, other 3 ext FROM   DIAGNOSTIC STUDIES: Recent laboratory studies:  Recent Labs  04/25/14 2218  WBC 14.7*  HGB 12.9  HCT 39.3  PLT 273    Recent Labs  04/25/14 2218  NA 135  K 3.7  CL 100  CO2 26  BUN 16  CREATININE 0.72  GLUCOSE 127*  CALCIUM 9.3   Lab Results  Component Value Date   INR 3.62* 04/25/2014   INR 2.74* 04/01/2014   INR 2.12* 03/31/2014     Recent Radiographic Studies :  Dg Chest 2 View  03/27/2014   CLINICAL DATA:  Productive cough  EXAM: CHEST  2 VIEW  COMPARISON:  03/26/2014  FINDINGS: Cardiac shadow is stable. The lungs are well aerated bilaterally. Increased density is noted in the posterior costophrenic angle on right consistent with an early infiltrate. This was not well appreciated on the prior exam. No acute bony abnormality is seen.  IMPRESSION: Early infiltrate in the right lung base posteriorly.   Electronically Signed   By: Alcide Clever M.D.   On: 03/27/2014 21:55   Dg Chest Port 1 View  04/25/2014   CLINICAL DATA:  Larey Seat tonight. History of atrial fibrillation and bradycardia.  EXAM: PORTABLE CHEST - 1 VIEW  COMPARISON:  03/27/2014  FINDINGS: The heart is enlarged but stable. There is tortuosity 10 mild calcification of the thoracic aorta. There are chronic bronchitic type interstitial lung changes but no acute overlying pulmonary process. Mild eventration of both hemidiaphragms. No definite pleural effusions.  IMPRESSION: No acute cardiopulmonary findings.   Electronically Signed   By: Rudie Meyer M.D.   On: 04/25/2014 22:07   Dg Abd Portable 1v  03/29/2014   CLINICAL DATA:   Initial evaluation left-sided abdominal pain for 1 month with nausea  EXAM: PORTABLE ABDOMEN - 1 VIEW  COMPARISON:  None.  FINDINGS: There are no abnormally dilated loops of bowel. There is stool throughout the entire colon. There is a 16 mm focus of calcification in the left lower quadrant. There are no abnormally dilated loops of bowel. There are a few nonspecific calcifications in the left upper quadrant.  IMPRESSION: 1. Constipation 2. Calcification left lower quadrant, nonspecific. This could represent calcification in an ovarian dermoid, or dense material in a diverticulum.   Electronically Signed   By: Esperanza Heir M.D.   On: 03/29/2014 10:48   Dg Hip Unilat With Pelvis 2-3 Views Right  04/25/2014   CLINICAL DATA:  Larey Seat today in the parking lot and injured right hip.  EXAM: RIGHT HIP (WITH PELVIS) 2-3 VIEWS  COMPARISON:  03/29/2014  FINDINGS: There is a displaced femoral neck fracture with shortening an lateral displacement. The left hip is intact. The pubic symphysis and SI joints are intact. No definite pelvic fractures. Stable bilateral buttock soft tissue calcifications.  IMPRESSION: Displaced right femoral neck fracture.   Electronically Signed   By: Rudie Meyer M.D.   On: 04/25/2014 21:41    ASSESSMENT:  Right displaced femoral neck fracture   PLAN:  Needs hemi hip.  Relatively active 81 yo lady who exercises regularly.  Medical team has seen and will reverse anticoag safely.  Plan on hemi hip Sunday morning if all goes well.  Discussed with patient and niece.  Cassandra Mcmanaman G 04/26/2014, 12:26 AM

## 2014-04-27 NOTE — Anesthesia Procedure Notes (Signed)
Procedure Name: Intubation Date/Time: 04/27/2014 10:10 AM Performed by: Edison PaceGRAY, Eloyce Bultman E Pre-anesthesia Checklist: Patient identified, Timeout performed, Emergency Drugs available, Suction available and Patient being monitored Patient Re-evaluated:Patient Re-evaluated prior to inductionOxygen Delivery Method: Circle system utilized Preoxygenation: Pre-oxygenation with 100% oxygen Intubation Type: IV induction Ventilation: Mask ventilation without difficulty Laryngoscope Size: Mac and 4 Grade View: Grade II Tube type: Oral Tube size: 7.5 mm Number of attempts: 1 Airway Equipment and Method: Stylet Placement Confirmation: ETT inserted through vocal cords under direct vision,  positive ETCO2 and breath sounds checked- equal and bilateral Secured at: 21 cm Tube secured with: Tape Dental Injury: Teeth and Oropharynx as per pre-operative assessment  Comments: On DL noted to have area on right lower base of tongue prior to intubation: purple/reddish, like a blood blister,  50 cent sized.  not bleeding.  ETT placed on left side of mouth.

## 2014-04-27 NOTE — Interval H&P Note (Signed)
OK for surgery. INR now 1.  PD

## 2014-04-27 NOTE — Anesthesia Preprocedure Evaluation (Addendum)
Anesthesia Evaluation  Patient identified by MRN, date of birth, ID band Patient awake    Reviewed: Allergy & Precautions, NPO status , Patient's Chart, lab work & pertinent test results  Airway Mallampati: II  TM Distance: >3 FB Neck ROM: Full    Dental no notable dental hx.    Pulmonary pneumonia -, resolved, former smoker,  breath sounds clear to auscultation  Pulmonary exam normal       Cardiovascular hypertension, Pt. on medications + dysrhythmias Atrial Fibrillation Rhythm:Irregular Rate:Normal  ECHO 2007:SUMMARY - Overall left ventricular systolic function was normal. Left    ventricular ejection fraction was estimated , range being 55    % to 65 %. There were no left ventricular regional wall    motion abnormalities. There was mild focal basal septal    hypertrophy. Left ventricular diastolic function parameters    were normal. - Aortic valve thickness was mildly increased. - There was mild mitral valvular regurgitation.    Neuro/Psych PSYCHIATRIC DISORDERS Depression negative neurological ROS     GI/Hepatic Neg liver ROS, GERD-  ,  Endo/Other  Hypothyroidism   Renal/GU negative Renal ROS  negative genitourinary   Musculoskeletal negative musculoskeletal ROS (+)   Abdominal   Peds negative pediatric ROS (+)  Hematology negative hematology ROS (+)   Anesthesia Other Findings   Reproductive/Obstetrics negative OB ROS                           Anesthesia Physical Anesthesia Plan  ASA: III  Anesthesia Plan: General   Post-op Pain Management:    Induction: Intravenous  Airway Management Planned: Oral ETT  Additional Equipment:   Intra-op Plan:   Post-operative Plan: Extubation in OR  Informed Consent: I have reviewed the patients History and Physical, chart, labs and discussed the procedure including the risks, benefits and alternatives for the  proposed anesthesia with the patient or authorized representative who has indicated his/her understanding and acceptance.   Dental advisory given  Plan Discussed with: CRNA  Anesthesia Plan Comments: (Recent coumadin use, now reversed, plan general.)       Anesthesia Quick Evaluation

## 2014-04-27 NOTE — Op Note (Signed)
PRE-OP DIAGNOSIS:  right hip fracture POST-OP DIAGNOSIS:  right hip fracture PROCEDURE: ARTHROPLASTY CEMENTED HEMI HIP ANESTHESIA:  General SURGEON:  Marcene CorningPeter Evelyn Moch MD ASSISTANT:  Elodia FlorenceAndrew Nida PA-C   INDICATIONS FOR PROCEDURE:  The patient is a 81 y.o. female with a recent history of a painful hip.  She fell recently and was admitted to the medical service with ORS consulted to stabilize a hip fracture.  After gentle reversal of anticoagulation she is offered hemiarthroplasty of the hip as surgical treatment.  Informed operative consent was obtained after discussion of possible complications including reaction to anesthesia, infection, neurovascular injury, dislocation, DVT, PE, and death.  The importance of the postoperative rehab program to optimize result was stressed with the patient.  SUMMARY OF FINDINGS AND PROCEDURE:  Under general anesthesia through a standard posterior approach a right hip hemiarthroplasty was performed.  The patient had no degenerative change and good bone quality.  We used DePuy components to replace the hip and these were size 2 summit basic femur capped with a 45 mm steel hip ball.  Elodia FlorenceAndrew Nida PA assisted throughout and was invaluable to the completion of the case in that he helped position and retract while I performed the procedure.  He also closed simultaneously to help minimize OR time.  DESCRIPTION OF PROCEDURE:  The patient was taken to the OR suite where general anesthetic was applied.  The patient was then positioned in the lateral decubitus position with the right hip up.  All bony prominences were appropriately padded, hip positioners were utilized, and an axillary roll was placed.  Prep and drape was then performed in normal sterile fashion.  The patient was given kefzol preoperative antibiotic and an appropriate time out was performed.  We then took a posterior approach to the right hip.  Dissection was taken through adipose to the IT band and gluteus maximus  fascia.  These structures were incised longitudinally to expose the short external rotators of the hip which were tagged and reflected.  A posterior capsulectomy was performed and the hip was dislocated.  The femoral head was free and was removed with a corkscrew. A femoral neck cut was made above the lesser trochanter.  The head measured to a 45 and a trial was done which was stable.  Attention was turned to the femur.  The femur was reamed and then broached to the appropriate size.  A trial reduction was done and the aforementioned head and neck assembly gave us the best stability in extension with external rotation and flexion with internal rotation.  Leg lengths were felt to be about equal.  The trial components were removed and the wound irrigated.  Cement was mixed and after cemented spacer was placed in the stem I placed cement under pressure in the femoral canal.  We then placed the femoral component in appropriate anteversion.  Pressure was held on the component until cement hardened. The head was applied to a dry stem neck and the hip again reduced.  It was again stable in the aforementioned positions.  The would was irrigated again followed by re-approximation of short external rotators to the greater trochanteric region.  IT band and gluteus maximum fascia were repaired with #1 vicryl followed by subcutaneous closure with #O and #2 undyed vicryl.  Skin was closed with subQ stitch and steristrips followed by a sterile dressing.  EBL and IOF can be obtained from anesthesia records.  DISPOSITION:  The patient was extubated in the OR and taken to PACU in  stable condition to be admitted to the Orthopedic Surgery for appropriate post-op care to include perioperative antibiotics and DVT prophylaxis.

## 2014-04-27 NOTE — Anesthesia Postprocedure Evaluation (Signed)
  Anesthesia Post-op Note  Patient: Candace Cain  Procedure(s) Performed: Procedure(s) (LRB): ARTHROPLASTY CEMENTED HEMI HIP (Right)  Patient Location: PACU  Anesthesia Type: general  Level of Consciousness: awake and alert   Airway and Oxygen Therapy: Patient Spontanous Breathing  Post-op Pain: mild  Post-op Assessment: Post-op Vital signs reviewed, Patient's Cardiovascular Status Stable, Respiratory Function Stable, Patent Airway and No signs of Nausea or vomiting  Last Vitals:  Filed Vitals:   04/27/14 1656  BP: 112/75  Pulse: 82  Temp: 37 C  Resp: 16    Post-op Vital Signs: stable   Complications: No apparent anesthesia complications

## 2014-04-27 NOTE — Progress Notes (Deleted)
ANTICOAGULATION CONSULT NOTE - Initial Consult  Pharmacy Consult for Warfarin Indication: atrial fibrillation, VTE prophylaxis post-op  No Known Allergies  Patient Measurements: Height: 5' 1.5" (156.2 cm) Weight: 116 lb 6.5 oz (52.8 kg) IBW/kg (Calculated) : 48.95   Vital Signs: Temp: 98.6 F (37 C) (04/17 1150) Temp Source: Oral (04/17 0933) BP: 135/65 mmHg (04/17 1315) Pulse Rate: 81 (04/17 1320)  Labs:  Recent Labs  04/25/14 2046  04/25/14 2218 04/26/14 0637 04/27/14 0535  HGB  --   < > 12.9 12.6 12.6  HCT  --   --  39.3 38.0 38.5  PLT  --   --  273 243 261  LABPROT 36.3*  --   --  31.8* 14.1  INR 3.62*  --   --  3.06* 1.07  CREATININE  --   --  0.72 0.68 0.77  < > = values in this interval not displayed.  Estimated Creatinine Clearance: 35.4 mL/min (by C-G formula based on Cr of 0.77).   Medical History: Past Medical History  Diagnosis Date  . Bradycardia   . Alopecia   . Unspecified essential hypertension   . Atrial fibrillation     Assessment: [age] y/oF with PMH of HTN, atrial fibrillation on chronic warfarin therapy, GERD, depression, hypothyroidism who [facility]p [facility]. Patient found to have closed right hip fracture by X-ray. INR supratherapeutic on admission at 3.62, which was reversed with Vitamin K 5 mg IV x 1 AM of 4/16 and 5 mg PO PM of 4/16. INR this AM 1.07, and patient underwent hemiarthroplasty of the hip. Pharmacy consulted to assist with dosing of warfarin post-op for this patient.  Home warfarin dose: Warfarin 2 mg on Mon/Wed/Fri, 3 mg on Tues/Thurs/Sat/Sun  Today, 04/27/2014:  INR 1.07  CBC WNL  New drug interactions: None significant  Goal of Therapy:  INR 2-3 Monitor platelets by anticoagulation protocol: Yes   Plan:   Warfarin 4 mg PO x 1 today.   Daily PT/INR.  Continue Lovenox 40mg  SQ q24h (dose per MD) until INR =/> 2.  Monitor CBC and for signs/symptoms of bleeding.   Greer PickerelJigna Nikoli Nasser, PharmD, BCPS Pager:  [phone] 04/27/2014 2:20 PM

## 2014-04-27 NOTE — Progress Notes (Signed)
ANTICOAGULATION CONSULT NOTE - Initial Consult  Pharmacy Consult for Warfarin Indication: atrial fibrillation, VTE prophylaxis post-op  No Known Allergies  Patient Measurements: Height: 5' 1.5" (156.2 cm) Weight: 116 lb 6.5 oz (52.8 kg) IBW/kg (Calculated) : 48.95   Vital Signs: Temp: 98.5 F (36.9 C) (04/17 1335) Temp Source: Oral (04/17 0933) BP: 135/71 mmHg (04/17 1335) Pulse Rate: 84 (04/17 1335)  Labs:  Recent Labs  04/25/14 2046  04/25/14 2218 04/26/14 0637 04/27/14 0535  HGB  --   < > 12.9 12.6 12.6  HCT  --   --  39.3 38.0 38.5  PLT  --   --  273 243 261  LABPROT 36.3*  --   --  31.8* 14.1  INR 3.62*  --   --  3.06* 1.07  CREATININE  --   --  0.72 0.68 0.77  < > = values in this interval not displayed.  Estimated Creatinine Clearance: 35.4 mL/min (by C-G formula based on Cr of 0.77).   Medical History: Past Medical History  Diagnosis Date  . Bradycardia   . Alopecia   . Unspecified essential hypertension   . Atrial fibrillation     Assessment: [age] y/oF with PMH of HTN, atrial fibrillation on chronic warfarin therapy, GERD, depression, hypothyroidism who [facility]p [facility]. Patient found to have closed right hip fracture by X-ray. INR supratherapeutic on admission at 3.62, which was reversed with Vitamin K 5 mg IV x 1 AM of 4/16 and 5 mg PO PM of 4/16. INR this AM 1.07, and patient underwent hemiarthroplasty of the hip. Pharmacy consulted to assist with dosing of warfarin post-op for this patient.  Home warfarin dose: Warfarin 2 mg on Mon/Wed/Fri, 3 mg on Tues/Thurs/Sat/Sun  Today, 04/27/2014:  INR 1.07  CBC WNL  New drug interactions: None significant  Goal of Therapy:  INR 2-3 Monitor platelets by anticoagulation protocol: Yes   Plan:   Warfarin 4 mg PO x 1 today.   Daily PT/INR.  Continue Lovenox 40mg  SQ q24h until INR >/= 1.8 per MD order.  Monitor CBC and for signs/symptoms of bleeding.   Greer PickerelJigna Brighten Orndoff, PharmD,  BCPS Pager: [phone] 04/27/2014 2:20 PM

## 2014-04-27 NOTE — Transfer of Care (Signed)
Immediate Anesthesia Transfer of Care Note  Patient: Candace Cain  Procedure(s) Performed: Procedure(s): ARTHROPLASTY CEMENTED HEMI HIP (Right)  Patient Location: PACU  Anesthesia Type:General  Level of Consciousness: awake, alert , oriented, patient cooperative and responds to stimulation  Airway & Oxygen Therapy: Patient Spontanous Breathing and Patient connected to face mask oxygen  Post-op Assessment: Report given to RN, Post -op Vital signs reviewed and stable and Patient moving all extremities X 4  Post vital signs: Reviewed and stable  Last Vitals:  Filed Vitals:   04/27/14 0933  BP: 131/67  Pulse: 96  Temp: 36.6 C  Resp: 16    Complications: No apparent anesthesia complications

## 2014-04-28 ENCOUNTER — Inpatient Hospital Stay (HOSPITAL_COMMUNITY): Payer: Medicare Other

## 2014-04-28 ENCOUNTER — Encounter (HOSPITAL_COMMUNITY): Payer: Self-pay | Admitting: Orthopaedic Surgery

## 2014-04-28 DIAGNOSIS — S72001D Fracture of unspecified part of neck of right femur, subsequent encounter for closed fracture with routine healing: Secondary | ICD-10-CM

## 2014-04-28 LAB — CBC
HEMATOCRIT: 33.4 % — AB (ref 36.0–46.0)
HEMOGLOBIN: 11 g/dL — AB (ref 12.0–15.0)
MCH: 31 pg (ref 26.0–34.0)
MCHC: 32.9 g/dL (ref 30.0–36.0)
MCV: 94.1 fL (ref 78.0–100.0)
Platelets: 231 10*3/uL (ref 150–400)
RBC: 3.55 MIL/uL — AB (ref 3.87–5.11)
RDW: 13.8 % (ref 11.5–15.5)
WBC: 8.8 10*3/uL (ref 4.0–10.5)

## 2014-04-28 LAB — BASIC METABOLIC PANEL
Anion gap: 4 — ABNORMAL LOW (ref 5–15)
BUN: 16 mg/dL (ref 6–23)
CALCIUM: 8 mg/dL — AB (ref 8.4–10.5)
CO2: 26 mmol/L (ref 19–32)
Chloride: 102 mmol/L (ref 96–112)
Creatinine, Ser: 0.62 mg/dL (ref 0.50–1.10)
GFR, EST AFRICAN AMERICAN: 89 mL/min — AB (ref >90)
GFR, EST NON AFRICAN AMERICAN: 76 mL/min — AB (ref >90)
Glucose, Bld: 103 mg/dL — ABNORMAL HIGH (ref 70–99)
Potassium: 3.5 mmol/L (ref 3.5–5.1)
Sodium: 132 mmol/L — ABNORMAL LOW (ref 135–145)

## 2014-04-28 LAB — PROTIME-INR
INR: 1.14 (ref 0.00–1.49)
Prothrombin Time: 14.8 seconds (ref 11.6–15.2)

## 2014-04-28 MED ORDER — METOPROLOL TARTRATE 25 MG PO TABS
25.0000 mg | ORAL_TABLET | Freq: Once | ORAL | Status: AC
  Administered 2014-04-28: 25 mg via ORAL
  Filled 2014-04-28: qty 1

## 2014-04-28 MED ORDER — METOPROLOL TARTRATE 1 MG/ML IV SOLN
5.0000 mg | Freq: Once | INTRAVENOUS | Status: DC
  Filled 2014-04-28: qty 5

## 2014-04-28 MED ORDER — HYDROCODONE-ACETAMINOPHEN 5-325 MG PO TABS: 1.0000 | ORAL_TABLET | ORAL | Status: DC | PRN

## 2014-04-28 MED ORDER — WARFARIN SODIUM 2 MG PO TABS: 2.0000 mg | ORAL_TABLET | Freq: Every day | ORAL | Status: DC

## 2014-04-28 MED ORDER — WARFARIN SODIUM 4 MG PO TABS
4.0000 mg | ORAL_TABLET | Freq: Once | ORAL | Status: AC
  Administered 2014-04-28: 4 mg via ORAL
  Filled 2014-04-28: qty 1

## 2014-04-28 NOTE — Progress Notes (Signed)
Clinical Social Work Department BRIEF PSYCHOSOCIAL ASSESSMENT 04/28/2014  Patient:  Candace Cain, Candace Cain     Account Number:  [id number]     Admit date:  04/25/2014  Clinical Social Worker:  Lacie Scotts  Date/Time:  04/28/2014 12:23 PM  Referred by:  Physician  Date Referred:  04/28/2014 Referred for  SNF Placement   Other Referral:   Interview type:  Patient Other interview type:    PSYCHOSOCIAL DATA Living Status:  FACILITY Admitted from facility:  [facility] Level of care:  Independent Living Primary support name:   Primary support relationship to patient:  CHILD, ADULT Degree of support available:   supportive    CURRENT CONCERNS Current Concerns  Post-Acute Placement   Other Concerns:    SOCIAL WORK ASSESSMENT / PLAN Pt is a 81 yr old female admitted from [facility]. PN reviewed. CSW met with pt / daughter to assist with d/c planning. Pt fell on 4/15 tripping over her shoe while trying to get out of the car. Pt fx her hip and had surgery on 4/17. Pt / daughter have already spoken with [facility] to discuss rehab. CSW has provided SNF with clinical info. [facility] will have a rehab bed for pt when stable for d/c. CSW has contacted [facility] to request SNF and ambulance authorization.   Assessment/plan status:  Psychosocial Support/Ongoing Assessment of Needs Other assessment/ plan:   Information/referral to community resources:   Insurance coverage for SNF and ambulance transport reviewed.    PATIENT'S/FAMILY'S RESPONSE TO PLAN OF CARE: " I was really sore while waiting for my surgery. I feel much better now. " Pt was anxious about getting up with therapy. Reassurance provided. CSW met with pt again following therapy. " Therapy wasn't that bad but now I feel sick." NSG aware. Additional support provided.    Werner Lean LCSW [phone]

## 2014-04-28 NOTE — Progress Notes (Signed)
Physical Therapy Treatment Patient Details Name: Candace Cain MRN: [id number] DOB: Oct 02, 1920 Today's Date: 04/28/2014    History of Present Illness s/p R hip hemiarthroplasty    PT Comments    Pt progressing well with mobility, she walked 4228' with RW and min A, performed R hip exercises with min A. She did not recall posterior precautions, reviewed them with pt.   Follow Up Recommendations  SNF     Equipment Recommendations  Rolling walker with 5" wheels    Recommendations for Other Services OT consult     Precautions / Restrictions Precautions Precautions: Fall;Posterior Hip Precaution Booklet Issued: Yes (comment) Precaution Comments: reviewed precautions with pt/daughter Restrictions Weight Bearing Restrictions: No Other Position/Activity Restrictions: WBAT RLE    Mobility  Bed Mobility Overal bed mobility: Needs Assistance Bed Mobility: Supine to Sit     Supine to sit: Mod assist     General bed mobility comments: assist to elevate trunk and to advance BLEs  Transfers Overall transfer level: Needs assistance Equipment used: Rolling walker (2 wheeled) Transfers: Sit to/from Stand Sit to Stand: +2 physical assistance;Mod assist         General transfer comment: Mod A to rise, cues for precautions,  pt anxious, cues for hand placement   Ambulation/Gait Ambulation/Gait assistance: +2 physical assistance;Mod assist Ambulation Distance (Feet): 28 Feet Assistive device: Rolling walker (2 wheeled) Gait Pattern/deviations: Step-to pattern;Decreased step length - right;Decreased stance time - right;Trunk flexed   Gait velocity interpretation: Below normal speed for age/gender General Gait Details: cues for sequencing and for posture   Stairs            Wheelchair Mobility    Modified Rankin (Stroke Patients Only)       Balance Overall balance assessment: Needs assistance   Sitting balance-Leahy Scale: Good       Standing balance-Leahy  Scale: Poor                      Cognition Arousal/Alertness: Awake/alert Behavior During Therapy: WFL for tasks assessed/performed Overall Cognitive Status: Within Functional Limits for tasks assessed                      Exercises Total Joint Exercises Ankle Circles/Pumps: AROM;Both;10 reps;Supine Quad Sets: AROM;Right;10 reps;Supine Short Arc Quad: AROM;Right;10 reps Heel Slides: AAROM;Right;15 reps;Supine Hip ABduction/ADduction: AAROM;Right;15 reps;Supine Long Arc Quad: AAROM;Right;5 reps;Seated    General Comments        Pertinent Vitals/Pain Pain Assessment: 0-10 Pain Score: 7  Pain Location: R hip with exercises Pain Descriptors / Indicators: Sore Pain Intervention(s): Limited activity within patient's tolerance;Monitored during session;Premedicated before session;Ice applied    Home Living Family/patient expects to be discharged to:: Skilled nursing facility       Home Access: Level entry   Home Layout: One level Home Equipment: None Additional Comments: spouse is in skilled, pt lives in independent living cottage    Prior Function Level of Independence: Independent          PT Goals (current goals can now be found in the care plan section) Acute Rehab PT Goals Patient Stated Goal: return to working out at gym 4x/week PT Goal Formulation: With patient Time For Goal Achievement: 05/12/14 Potential to Achieve Goals: Good Progress towards PT goals: Progressing toward goals    Frequency  Min 5X/week    PT Plan Current plan remains appropriate    Co-evaluation             End  of Session Equipment Utilized During Treatment: Gait belt Activity Tolerance: Patient tolerated treatment well (nausea, RN notified) Patient left: in chair;with call bell/phone within reach     Time: 1256-1330 PT Time Calculation (min) (ACUTE ONLY): 34 min  Charges:  $Gait Training: 8-22 mins $Therapeutic Exercise: 8-22 mins $Therapeutic Activity:  8-22 mins                    G Codes:      Tamala Ser 04/28/2014, 1:40 PM [phone]

## 2014-04-28 NOTE — Evaluation (Signed)
Occupational Therapy Evaluation Patient Details Name: Candace Cain MRN: [id number] DOB: 11/04/1920 Today's Date: 04/28/2014    History of Present Illness s/p R hip hemiarthroplasty   Clinical Impression   Pt is s/p THA resulting in the deficits listed below (see OT Problem List).  Pt will benefit from skilled OT to increase their safety and independence with ADL and functional mobility for ADL to facilitate discharge to venue listed below.      Follow Up Recommendations  SNF    Equipment Recommendations  None recommended by OT       Precautions / Restrictions Precautions Precautions: Fall;Posterior Hip Precaution Booklet Issued: Yes (comment) Precaution Comments: reviewed precautions with pt/daughter Restrictions Weight Bearing Restrictions: No Other Position/Activity Restrictions: WBAT RLE      Mobility Bed Mobility Overal bed mobility: Needs Assistance;+2 for physical assistance Bed Mobility: Supine to Sit     Supine to sit: +2 for physical assistance;Max assist     General bed mobility comments: +2 to elevate trunk and to advance BLEs, pt fearful   Transfers Overall transfer level: Needs assistance Equipment used: Rolling walker (2 wheeled) Transfers: Sit to/from Stand Sit to Stand: +2 physical assistance;Max assist         General transfer comment: +2 to rise, pt 50%, pt anxious, cues for hand placement     Balance Overall balance assessment: Needs assistance   Sitting balance-Leahy Scale: Good       Standing balance-Leahy Scale: Poor                              ADL Overall ADL's : Needs assistance/impaired     Grooming: Wash/dry face;Sitting   Upper Body Bathing: Set up;Sitting   Lower Body Bathing: Maximal assistance;Sit to/from stand   Upper Body Dressing : Set up;Sitting   Lower Body Dressing: Sit to/from stand;Maximal assistance   Toilet Transfer: Moderate assistance;Adhering to hip precautions;Cueing for safety;Cueing  for sequencing;RW;+2 for safety/equipment;Ambulation   Toileting- Clothing Manipulation and Hygiene: Sit to/from stand;Maximal assistance;+2 for safety/equipment                         Pertinent Vitals/Pain Pain Assessment: 0-10 Pain Score: 4  Pain Location: r hip Pain Descriptors / Indicators: Sore Pain Intervention(s): Ice applied;Repositioned;Monitored during session     Hand Dominance     Extremity/Trunk Assessment Upper Extremity Assessment Upper Extremity Assessment: Overall WFL for tasks assessed   Lower Extremity Assessment Lower Extremity Assessment: RLE deficits/detail RLE Deficits / Details: R hip AAROM limited 40% by pain, ankle WNL, knee AAROM WFL   Cervical / Trunk Assessment Cervical / Trunk Assessment: Normal   Communication Communication Communication: No difficulties   Cognition Arousal/Alertness: Awake/alert Behavior During Therapy: WFL for tasks assessed/performed Overall Cognitive Status: Within Functional Limits for tasks assessed                                Home Living Family/patient expects to be discharged to:: Skilled nursing facility       Home Access: Level entry     Home Layout: One level               Home Equipment: None   Additional Comments: spouse is in skilled, pt lives in independent living cottage      Prior Functioning/Environment Level of Independence: Independent  OT Diagnosis: Generalized weakness;Acute pain   OT Problem List: Decreased strength;Decreased activity tolerance;Impaired balance (sitting and/or standing);Decreased knowledge of precautions   OT Treatment/Interventions: Self-care/ADL training;DME and/or AE instruction;Patient/family education    OT Goals(Current goals can be found in the care plan section) Acute Rehab OT Goals Patient Stated Goal: to walk OT Goal Formulation: With patient Time For Goal Achievement: 05/12/14 Potential to Achieve Goals: Good   OT Frequency: Min 2X/week   Barriers to D/C: Decreased caregiver support             End of Session Nurse Communication: Mobility status;Precautions  Activity Tolerance: Patient tolerated treatment well Patient left: in bed   Time: 1132-1148 OT Time Calculation (min): 16 min Charges:  OT General Charges $OT Visit: 1 Procedure OT Evaluation $Initial OT Evaluation Tier I: 1 Procedure G-Codes:    Candace Cain 18-May-2014, 12:19 PM

## 2014-04-28 NOTE — Evaluation (Signed)
Physical Therapy Evaluation Patient Details Name: Candace Cain MRN: [id number] DOB: 1920/12/10 Today's Date: 04/28/2014   History of Present Illness  s/p R hip hemiarthroplasty  Clinical Impression  Pt admitted with above diagnosis. Pt currently with functional limitations due to the deficits listed below (see PT Problem List). +2 assist for bed to recliner, pt limited by nausea and dizziness, vital signs stable. Good progress expected once nausea resolves.  Pt will benefit from skilled PT to increase their independence and safety with mobility to allow discharge to the venue listed below.       Follow Up Recommendations SNF    Equipment Recommendations  Rolling walker with 5" wheels    Recommendations for Other Services OT consult     Precautions / Restrictions Precautions Precautions: Fall;Posterior Hip Precaution Booklet Issued: Yes (comment) Precaution Comments: reviewed precautions with pt/daughter Restrictions Weight Bearing Restrictions: No Other Position/Activity Restrictions: WBAT RLE      Mobility  Bed Mobility Overal bed mobility: Needs Assistance;+2 for physical assistance Bed Mobility: Supine to Sit     Supine to sit: +2 for physical assistance;Max assist     General bed mobility comments: +2 to elevate trunk and to advance BLEs, pt fearful   Transfers Overall transfer level: Needs assistance Equipment used: Rolling walker (2 wheeled) Transfers: Sit to/from Stand Sit to Stand: +2 physical assistance;Max assist         General transfer comment: +2 to rise, pt 50%, pt anxious, cues for hand placement   Ambulation/Gait Ambulation/Gait assistance: +2 physical assistance;Mod assist Ambulation Distance (Feet): 4 Feet Assistive device: Rolling walker (2 wheeled) Gait Pattern/deviations: Step-to pattern;Shuffle;Decreased stance time - right;Decreased step length - right   Gait velocity interpretation: Below normal speed for age/gender General Gait  Details: max cues for technique and for relaxation breathing due to anxiety, pt reported dizziness/nausea in standing, +2 for balance/safety, cues to lift head due to flexed posture  Stairs            Wheelchair Mobility    Modified Rankin (Stroke Patients Only)       Balance Overall balance assessment: Needs assistance   Sitting balance-Leahy Scale: Good       Standing balance-Leahy Scale: Poor                               Pertinent Vitals/Pain Pain Assessment: 0-10 Pain Score: 5  Pain Location: R hip with activity Pain Descriptors / Indicators: Sore Pain Intervention(s): Limited activity within patient's tolerance;Monitored during session;Premedicated before session;Ice applied    Home Living Family/patient expects to be discharged to:: Skilled nursing facility       Home Access: Level entry     Home Layout: One level Home Equipment: None Additional Comments: spouse is in skilled, pt lives in independent living cottage    Prior Function Level of Independence: Independent               Hand Dominance        Extremity/Trunk Assessment   Upper Extremity Assessment: Overall WFL for tasks assessed           Lower Extremity Assessment: RLE deficits/detail RLE Deficits / Details: R hip AAROM limited 40% by pain, ankle WNL, knee AAROM WFL    Cervical / Trunk Assessment: Normal  Communication   Communication: No difficulties  Cognition Arousal/Alertness: Awake/alert Behavior During Therapy: WFL for tasks assessed/performed Overall Cognitive Status: Within Functional Limits for tasks assessed  General Comments      Exercises Total Joint Exercises Ankle Circles/Pumps: AROM;Both;10 reps;Supine Heel Slides: AAROM;Right;15 reps;Supine Hip ABduction/ADduction: AAROM;Right;15 reps;Supine      Assessment/Plan    PT Assessment Patient needs continued PT services  PT Diagnosis Difficulty  walking;Acute pain   PT Problem List Decreased strength;Decreased range of motion;Decreased activity tolerance;Decreased balance;Pain;Decreased knowledge of use of DME;Decreased knowledge of precautions  PT Treatment Interventions DME instruction;Gait training;Functional mobility training;Therapeutic activities;Therapeutic exercise;Patient/family education   PT Goals (Current goals can be found in the Care Plan section) Acute Rehab PT Goals Patient Stated Goal: to walk PT Goal Formulation: With patient Time For Goal Achievement: 05/12/14 Potential to Achieve Goals: Good    Frequency Min 5X/week   Barriers to discharge        Co-evaluation               End of Session Equipment Utilized During Treatment: Gait belt Activity Tolerance: Patient limited by pain;Treatment limited secondary to medical complications (Comment) (nausea, RN notified) Patient left: in chair;with call bell/phone within reach;with family/visitor present Nurse Communication: Mobility status         Time: [id number] PT Time Calculation (min) (ACUTE ONLY): 35 min   Charges:   PT Evaluation $Initial PT Evaluation Tier I: 1 Procedure PT Treatments $Therapeutic Activity: 8-22 mins   PT G Codes:        Tamala SerUhlenberg, Dennie Moltz Kistler 04/28/2014, 11:03 AM [phone]

## 2014-04-28 NOTE — Progress Notes (Signed)
[facility] PROGRESS NOTE  Cheri Fowlerlva Rayman ZOX:[id number] DOB: 1920/12/18 DOA: 04/25/2014 PCP:  Duane Lopeoss, Alan, MD  Assessment/Plan: 1. Right femoral neck fracture -Patient is a pleasant 81 year old female who is highly active, resides in the community, having a mechanical fall with lose her balance while getting out of a car. -Patient taken to the OR on 04/27/2014 where she had arthroplasty of right hip fracture, tolerated procedure well no immediate complications -Restarting Coumadin -Anticipate discharge to SNF in the next 24 hours  2.  Atrial fibrillation -Patient remains rate controlled, having heart rates in the 80s. -Pharmacy consulted, restarting Coumadin therapy.  -INR =1.14  3.  Hypertension -Blood pressures are well controlled will continue amlodipine 10 mg by mouth daily -Last BP of 138/64  4.  Hypothyroidism. -She had a TSH checked on 03/28/2014 that was within normal limits at 1.148 -Will continue Synthroid 25 g by mouth daily  5.  Urinary tract infection -Will transition to oral Ceftin, discontinue ceftriaxone as she is afebrile and tolerating by mouth intake.  Code Status: DO NOT RESUSCITATE Family Communication: I spoke to her daughter was present at bedside Disposition Plan: Discharge to SNF in the next 24 hours   Consultants:  Orthopedic surgery  Orthopedic Surgery   Antibiotics:  Ceftriaxone 1 g IV every 24 hours discontinued 04/27/2014  Ceftin 250 g by mouth twice a day started on 04/27/2014  HPI/Subjective: Patient is a pleasant 81 year old female is highly functional, having a history of atrial fibrillation on chronic anticoagulation with Coumadin, history of hypertension, gastroesophageal reflux disease, hypothyroidism, who was admitted to medicine service on 04/25/2014 after having a fall. She had stated getting out of her car in a parking lot losing her balance and falling on the concrete floor. There was no loss of consciousness. She complained  of pain. Workup revealed a right femoral neck fracture for which orthopedic surgery was consulted. Labs also showed an INR of 3.62 for which she was given 5 mg of vitamin K. INR came down to 3.06 on the following morning for which an additional 5 mg of vitamin K was given.   Objective: Filed Vitals:   04/28/14 1350  BP: 138/64  Pulse: 65  Temp: 98.2 F (36.8 C)  Resp: 18    Intake/Output Summary (Last 24 hours) at 04/28/14 1528 Last data filed at 04/28/14 1052  Gross per 24 hour  Intake   1975 ml  Output   1625 ml  Net    350 ml   Filed Weights   04/26/14 0200  Weight: 52.8 kg (116 lb 6.5 oz)    Exam:   General:  Patient is awake, alert, no acute distress   Cardiovascular: Regular rate and rhythm normal S1-S2 no murmurs rubs or gallops   Respiratory: Normal as a driver, lungs are clear to auscultation bilaterally  Abdomen: soft nontender nondistended  Musculoskeletal: Right hip inspected, she does not have evidence of bleed, surgical incision site covered bandage    Data Reviewed: Basic Metabolic Panel:  Recent Labs Lab 04/25/14 2218 04/26/14 0637 04/27/14 0535 04/28/14 0650  NA 135 132* 132* 132*  K 3.7 3.8 3.7 3.5  CL 100 99 101 102  CO2 26 24 25 26   GLUCOSE 127* 132* 104* 103*  BUN 16 15 17 16   CREATININE 0.72 0.68 0.77 0.62  CALCIUM 9.3 8.7 8.6 8.0*   Liver Function Tests:  Recent Labs Lab 04/26/14 0637  AST 35  ALT 25  ALKPHOS 64  BILITOT 0.4  PROT 6.7  ALBUMIN 4.0   No results for input(s): LIPASE, AMYLASE in the last 168 hours. No results for input(s): AMMONIA in the last 168 hours. CBC:  Recent Labs Lab 04/25/14 2218 04/26/14 0637 04/27/14 0535 04/28/14 0650  WBC 14.7* 11.1* 9.5 8.8  NEUTROABS 13.4* 10.2*  --   --   HGB 12.9 12.6 12.6 11.0*  HCT 39.3 38.0 38.5 33.4*  MCV 92.5 92.5 93.7 94.1  PLT 273 243 261 231   Cardiac Enzymes: No results for input(s): CKTOTAL, CKMB, CKMBINDEX, TROPONINI in the last 168 hours. BNP (last 3  results) No results for input(s): BNP in the last 8760 hours.  ProBNP (last 3 results) No results for input(s): PROBNP in the last 8760 hours.  CBG: No results for input(s): GLUCAP in the last 168 hours.  Recent Results (from the past 240 hour(s))  Surgical pcr screen     Status: None   Collection Time: 04/26/14  5:28 PM  Result Value Ref Range Status   MRSA, PCR NEGATIVE NEGATIVE Final   Staphylococcus aureus NEGATIVE NEGATIVE Final    Comment:        The Xpert SA Assay (FDA approved for NASAL specimens in patients over 23 years of age), is one component of a comprehensive surveillance program.  Test performance has been validated by [facility] for patients greater than or equal to 13 year old. It is not intended to diagnose infection nor to guide or monitor treatment.      Studies: Pelvis Portable  04/27/2014   CLINICAL DATA:  Status post right hip replacement.  EXAM: PORTABLE PELVIS 1-2 VIEWS  COMPARISON:  Abdomen radiographs dated 03/29/2014.  FINDINGS: Right hip prosthesis in satisfactory position and alignment. No fracture or dislocation seen. Calcifications are again noted overlying the pelvis bilaterally. These are projected in different positions than previously. These may represent calcified injection granulomata.  IMPRESSION: Satisfactory postoperative appearance of a right hip prosthesis.   Electronically Signed   By: Beckie Salts M.D.   On: 04/27/2014 12:24    Scheduled Meds: . amLODipine  10 mg Oral Daily  . buPROPion  150 mg Oral Daily  . cefUROXime  250 mg Oral BID WC  . docusate sodium  100 mg Oral BID  . enoxaparin (LOVENOX) injection  40 mg Subcutaneous Q24H  . ferrous sulfate  325 mg Oral Q breakfast  . levothyroxine  25 mcg Oral QAC breakfast  . warfarin  4 mg Oral ONCE-1800  . Warfarin - Pharmacist Dosing Inpatient   Does not apply q1800   Continuous Infusions:    Principal Problem:   Closed right hip fracture Active Problems:   Essential  hypertension   Atrial fibrillation   Hyponatremia   GERD (gastroesophageal reflux disease)   Depression    Time spent: 25 minutes   Jeralyn Bennett  [facility] Pager [phone]. If 7PM-7AM, please contact night-coverage at [email or web address], password [facility] 04/28/2014, 3:28 PM  LOS: 3 days

## 2014-04-28 NOTE — Progress Notes (Addendum)
Pt had some SOB and became tachycardic after using BSC.   O2 dropped in the 50s. On 2L, patient's O2 came up to high 90s, but will desat if O2 is turned down.   Pulse was jumping on monitor from 133, to 126, to 116. MD notified. EKG read sinus tach with 1st degree block and PACs, which is a change from her previous EKG.   MD notified of all findings and orders given. Will continue to monitor patient.

## 2014-04-28 NOTE — Progress Notes (Signed)
ANTICOAGULATION CONSULT NOTE - Initial Consult  Pharmacy Consult for Warfarin Indication: atrial fibrillation, VTE prophylaxis post-op  No Known Allergies  Patient Measurements: Height: 5' 1.5" (156.2 cm) Weight: 116 lb 6.5 oz (52.8 kg) IBW/kg (Calculated) : 48.95   Vital Signs: Temp: 98.3 F (36.8 C) (04/18 0555) Temp Source: Oral (04/18 0555) BP: 139/67 mmHg (04/18 0555) Pulse Rate: 62 (04/18 0555)  Labs:  Recent Labs  04/26/14 0637 04/27/14 0535 04/28/14 0650  HGB 12.6 12.6 11.0*  HCT 38.0 38.5 33.4*  PLT 243 261 231  LABPROT 31.8* 14.1 14.8  INR 3.06* 1.07 1.14  CREATININE 0.68 0.77 0.62    Estimated Creatinine Clearance: 35.4 mL/min (by C-G formula based on Cr of 0.62).   Medical History: Past Medical History  Diagnosis Date  . Bradycardia   . Alopecia   . Unspecified essential hypertension   . Atrial fibrillation     Assessment: 81 y.o F with PMH of HTN, atrial fibrillation on chronic warfarin therapy, GERD, depression, hypothyroidism who [facility]. Patient found to have closed right hip fracture by X-ray. INR supratherapeutic on admission at 3.62, which was reversed with Vitamin K 5 mg IV x 1 AM of 4/16 and 5 mg PO PM of 4/16. INR this AM 1.07, and patient underwent hemiarthroplasty of the hip on 4/17. Pharmacy consulted to assist with dosing of warfarin post-op for this patient.  Home warfarin dose: Has 2mg  tablets at home. Warfarin 4 mg on Mon/Wed/Fri, 3 mg on Tues/Thurs/Sat/Sun (verified with patient and [facility])  Today, 04/28/2014:  INR 1.14  CBC ok  No oral intake documented  New drug interactions: ceftin  Goal of Therapy:  INR 2-3 Monitor platelets by anticoagulation protocol: Yes   Plan:   Warfarin 4 mg PO x 1.  May need to increase dose due to vit K given on 4/16.  Daily PT/INR.  Continue Lovenox 40mg  SQ q24h until INR >/= 1.8 per MD order.  Monitor CBC and for signs/symptoms of bleeding.  Dorna LeitzAnh  Lee Kuang, PharmD, BCPS 04/28/2014 9:46 AM

## 2014-04-28 NOTE — Progress Notes (Signed)
Subjective: 1 Day Post-Op Procedure(s) (LRB): ARTHROPLASTY CEMENTED HEMI HIP (Right)   Patient is up and resting comfortably in bed. She states that she has no pain and is feeling great.  Activity level:  WBAT with posterior hip precautions Diet tolerance:  ok Voiding:  ok Patient reports pain as mild.    Objective: Vital signs in last 24 hours: Temp:  [97.4 F (36.3 C)-99 F (37.2 C)] 98.3 F (36.8 C) (04/18 0555) Pulse Rate:  [62-96] 62 (04/18 0555) Resp:  [12-18] 16 (04/18 0800) BP: (103-151)/(60-91) 139/67 mmHg (04/18 0555) SpO2:  [90 %-100 %] 90 % (04/18 0800)  Labs:  Recent Labs  04/25/14 2218 04/26/14 0637 04/27/14 0535 04/28/14 0650  HGB 12.9 12.6 12.6 11.0*    Recent Labs  04/27/14 0535 04/28/14 0650  WBC 9.5 8.8  RBC 4.11 3.55*  HCT 38.5 33.4*  PLT 261 231    Recent Labs  04/27/14 0535 04/28/14 0650  NA 132* 132*  K 3.7 3.5  CL 101 102  CO2 25 26  BUN 17 16  CREATININE 0.77 0.62  GLUCOSE 104* 103*  CALCIUM 8.6 8.0*    Recent Labs  04/27/14 0535 04/28/14 0650  INR 1.07 1.14    Physical Exam:  Neurologically intact ABD soft Neurovascular intact Sensation intact distally Intact pulses distally Dorsiflexion/Plantar flexion intact Incision: dressing C/D/I No cellulitis present Compartment soft  Assessment/Plan:  1 Day Post-Op Procedure(s) (LRB): ARTHROPLASTY CEMENTED HEMI HIP (Right) Advance diet Up with therapy  She is doing great from an orthopaedic standpoint. Continue WBAT with posterior hip precautions. She must have knee immobilizer on while in bed. Continue on her coumadin she was on prior to surgery for DVT prevention. Continue current pain meds.  Follow up in office 2 weeks post op. We greatly appreciate medical management.   Gerod Caligiuri, Ginger OrganNDREW PAUL 04/28/2014, 9:11 AM

## 2014-04-28 NOTE — Progress Notes (Signed)
Clinical Social Work Department CLINICAL SOCIAL WORK PLACEMENT NOTE 04/28/2014  Patient:  Candace FowlerWILMOTH,[name]  Account Number:  [id number] Admit date:  04/25/2014  Clinical Social Worker:  [name] Jeshawn Melucci, LCSW  Date/time:  04/28/2014 01:13 PM  Clinical Social Work is seeking post-discharge placement for this patient at the following level of care:   SKILLED NURSING   (*CSW will update this form in Epic as items are completed)     [facility]/family provided with [facility] of Clinical [facility] Work's list of facilities offering this level of care within the geographic area requested by the patient (or if unable, by the patient's family).  04/28/2014  Patient/family informed of their freedom to choose among providers that offer the needed level of care, that participate in Medicare, Medicaid or managed care program needed by the patient, have an available bed and are willing to accept the patient.    Patient/family informed of [facility]' ownership interest in [facility], as well as of the fact that they are under no obligation to receive care at this facility.  PASARR submitted to EDS on 04/28/2014 PASARR number received on 04/28/2014  FL2 transmitted to all facilities in geographic area requested by pt/family on   FL2 transmitted to all facilities within larger geographic area on   Patient informed that his/her managed care company has contracts with or will negotiate with  certain facilities, including the following:     Patient/family informed of bed offers received:  04/28/2014 [facility] Physician recommends and patient chooses bed at    Patient to be transferred to  on   Patient to be transferred to facility by  Patient and family notified of transfer on  Name of family member notified:    The following physician request were entered in Epic:   Additional Comments:  [name] Deren Degrazia LCSW [phone]

## 2014-04-29 LAB — CBC
HCT: 32 % — ABNORMAL LOW (ref 36.0–46.0)
HEMOGLOBIN: 10.7 g/dL — AB (ref 12.0–15.0)
MCH: 31.3 pg (ref 26.0–34.0)
MCHC: 33.4 g/dL (ref 30.0–36.0)
MCV: 93.6 fL (ref 78.0–100.0)
Platelets: 257 10*3/uL (ref 150–400)
RBC: 3.42 MIL/uL — ABNORMAL LOW (ref 3.87–5.11)
RDW: 13.8 % (ref 11.5–15.5)
WBC: 8.7 10*3/uL (ref 4.0–10.5)

## 2014-04-29 LAB — BASIC METABOLIC PANEL
Anion gap: 7 (ref 5–15)
BUN: 12 mg/dL (ref 6–23)
CHLORIDE: 103 mmol/L (ref 96–112)
CO2: 25 mmol/L (ref 19–32)
Calcium: 8.3 mg/dL — ABNORMAL LOW (ref 8.4–10.5)
Creatinine, Ser: 0.53 mg/dL (ref 0.50–1.10)
GFR calc Af Amer: >90 mL/min (ref >90)
GFR calc non Af Amer: 81 mL/min — ABNORMAL LOW (ref >90)
GLUCOSE: 85 mg/dL (ref 70–99)
POTASSIUM: 3.5 mmol/L (ref 3.5–5.1)
Sodium: 135 mmol/L (ref 135–145)

## 2014-04-29 LAB — PROTIME-INR
INR: 1.55 — AB (ref 0.00–1.49)
Prothrombin Time: 18.7 seconds — ABNORMAL HIGH (ref 11.6–15.2)

## 2014-04-29 MED ORDER — WARFARIN SODIUM 3 MG PO TABS: 3.0000 mg | ORAL_TABLET | Freq: Every day | ORAL | Status: DC

## 2014-04-29 MED ORDER — CEFUROXIME AXETIL 250 MG PO TABS: 250.0000 mg | ORAL_TABLET | Freq: Two times a day (BID) | ORAL | Status: DC

## 2014-04-29 MED ORDER — FERROUS SULFATE 325 (65 FE) MG PO TABS: 325.0000 mg | ORAL_TABLET | Freq: Every day | ORAL | Status: DC

## 2014-04-29 MED ORDER — WARFARIN SODIUM 3 MG PO TABS
3.0000 mg | ORAL_TABLET | Freq: Once | ORAL | Status: DC
  Filled 2014-04-29: qty 1

## 2014-04-29 NOTE — Progress Notes (Signed)
RN called SN[facility] facility [facility]   All questions answered.   Packet of information sent with daughter, and patient to SNF facility.

## 2014-04-29 NOTE — Discharge Summary (Signed)
Physician Discharge Summary  Candace Cain MWU:[id number] DOB: Feb 23, 1920 DOA: 04/25/2014  PCP:  Duane Lope, MD  Admit date: 04/25/2014 Discharge date: 04/29/2014  Time spent: 35 minutes  Recommendations for Outpatient Follow-up:  1. Case discussed with Pharmacy regarding her anticoagulation, recommended that she be discharged on 3 mg PO q daily of warfarin daily and repeat INR on 05/02/2014. She had an INR on 1.55 on day of discharge.  2. Repeat CBC and BMP in 3-4 days 3. Ceftin 250 mg PO BID for UTI x 3 days then stop   Discharge Diagnoses:  Principal Problem:   Closed right hip fracture Active Problems:   Essential hypertension   Atrial fibrillation   Hyponatremia   GERD (gastroesophageal reflux disease)   Depression   Discharge Condition: Stable  Diet recommendation: Heart Healthy  Filed Weights   04/26/14 0200  Weight: 52.8 kg (116 lb 6.5 oz)    History of present illness:  Candace Cain is a 81 y.o. female with Past medical history of hypertension, A. fib on Coumadin, GERD, depression and hypothyroidism. The patient presented with complaints of a fall. The fall was mechanical. She was with her friends and was trying to come out of the car and tripped over her shoe and fell on the ground on the right side. She denies hitting her head and denies hitting her neck. Does not have any complaint of headache or neck pain. Does not have any focal deficit. She denies passing out. She denies any dizziness or lightheadedness denies any nausea vomiting diarrhea or constipation prior to this event. Does not have any burning urination. Does not have any focal deficit at the time of my evaluation. No recent change in her medications reported. She denies any prior history of coronary artery disease or strokes.  Hospital Course:  Patient is a pleasant 81 year old female is highly functional, having a history of atrial fibrillation on chronic anticoagulation with Coumadin, history of  hypertension, gastroesophageal reflux disease, hypothyroidism, who was admitted to medicine service on 04/25/2014 after having a fall. She had stated getting out of her car in a parking lot losing her balance and falling on the concrete floor. There was no loss of consciousness. She complained of pain. Workup revealed a right femoral neck fracture for which orthopedic surgery was consulted. Labs also showed an INR of 3.62 for which she was given 5 mg of vitamin K. INR came down to 3.06 on the following morning for which an additional 5 mg of vitamin K was given. On the day of surgery her INR had come down to 1.07. She was taken to the OR on 04/27/2014 where she had arthroplasty of right hip fracture, tolerated procedure well no immediate complications, postoperatively her coumadin was restarted. On 04/28/2014 RN reported patient having episode of O2 desaturation. CXR revealed mild left basilar opacity likely reflective of atelectasis. She remained afebrile, white count was within normal limits, she remained hemodynamically stable. Patient was monitored overnight. I personally checked her oxygen saturations on day of discharge, was satting 95% on room air. She was discharged to [facility] in stable condition on 04/29/2014.   Procedures:  Arthroplasty of right hip fracture.   Consultations:  Orthopedic Surgery  Physical Therapy  Social Services.  Discharge Exam: Filed Vitals:   04/29/14 0446  BP: 142/74  Pulse: 69  Temp: 98.8 F (37.1 C)  Resp: 20     General: Patient is awake, alert, no acute distress   Cardiovascular: Regular rate and rhythm normal S1-S2  no murmurs rubs or gallops   Respiratory: Normal as a driver, lungs are clear to auscultation bilaterally  Abdomen: soft nontender nondistended  Musculoskeletal: Right hip inspected, she does not have evidence of bleed, surgical incision site covered bandage  Discharge Instructions   Discharge Instructions    Call MD for:   difficulty breathing, headache or visual disturbances    Complete by:  As directed      Call MD for:  difficulty breathing, headache or visual disturbances    Complete by:  As directed      Call MD for:  extreme fatigue    Complete by:  As directed      Call MD for:  extreme fatigue    Complete by:  As directed      Call MD for:  hives    Complete by:  As directed      Call MD for:  hives    Complete by:  As directed      Call MD for:  persistant dizziness or light-headedness    Complete by:  As directed      Call MD for:  persistant dizziness or light-headedness    Complete by:  As directed      Call MD for:  persistant nausea and vomiting    Complete by:  As directed      Call MD for:  persistant nausea and vomiting    Complete by:  As directed      Call MD for:  redness, tenderness, or signs of infection (pain, swelling, redness, odor or green/yellow discharge around incision site)    Complete by:  As directed      Call MD for:  redness, tenderness, or signs of infection (pain, swelling, redness, odor or green/yellow discharge around incision site)    Complete by:  As directed      Call MD for:  severe uncontrolled pain    Complete by:  As directed      Call MD for:  severe uncontrolled pain    Complete by:  As directed      Call MD for:  temperature >100.4    Complete by:  As directed      Call MD for:  temperature >100.4    Complete by:  As directed      Diet - low sodium heart healthy    Complete by:  As directed      Diet - low sodium heart healthy    Complete by:  As directed      Increase activity slowly    Complete by:  As directed      Increase activity slowly    Complete by:  As directed      Weight bearing as tolerated    Complete by:  As directed   Must have knee immobilizer on while in bed. Posterior hip precautions.  Laterality:  right  Extremity:  Lower          Current Discharge Medication List    START taking these medications   Details   cefUROXime (CEFTIN) 250 MG tablet Take 1 tablet (250 mg total) by mouth 2 (two) times daily with a meal. Qty: 6 tablet, Refills: 0    ferrous sulfate 325 (65 FE) MG tablet Take 1 tablet (325 mg total) by mouth daily with breakfast. Qty: 30 tablet, Refills: 3    HYDROcodone-acetaminophen (NORCO/VICODIN) 5-325 MG per tablet Take 1-2 tablets by mouth every 4 (four) hours as needed for moderate  pain or severe pain. Qty: 40 tablet, Refills: 0      CONTINUE these medications which have CHANGED   Details  warfarin (COUMADIN) 3 MG tablet Take 1 tablet (3 mg total) by mouth daily. Qty: 30 tablet, Refills: 1      CONTINUE these medications which have NOT CHANGED   Details  alum & mag hydroxide-simeth (MAALOX/MYLANTA) 200-200-20 MG/5ML suspension Take 15 mLs by mouth every 4 (four) hours as needed for indigestion or heartburn. Qty: 355 mL, Refills: 0    amLODipine (NORVASC) 5 MG tablet Take 10 mg by mouth daily.     buPROPion (WELLBUTRIN XL) 150 MG 24 hr tablet Take 150 mg by mouth daily.    Calcium Carbonate-Vitamin D (TH CALCIUM CARBONATE-VITAMIN D) 600-400 MG-UNIT per tablet Take 2 tablets by mouth daily.      fish oil-omega-3 fatty acids 1000 MG capsule Take 1 g by mouth 2 (two) times daily.     levothyroxine (SYNTHROID, LEVOTHROID) 25 MCG tablet Take 25 mcg by mouth daily before breakfast.    docusate sodium (COLACE) 100 MG capsule Take 1 capsule (100 mg total) by mouth 2 (two) times daily. Qty: 10 capsule, Refills: 0    isosorbide-hydrALAZINE (BIDIL) 20-37.5 MG per tablet Take 1 tablet by mouth 2 (two) times daily. Qty: 60 tablet, Refills: 0    nitroGLYCERIN (NITROSTAT) 0.4 MG SL tablet Place 0.4 mg under the tongue every 5 (five) minutes as needed.        STOP taking these medications     b complex vitamins tablet      Multiple Vitamin (MULTIVITAMIN) tablet      terbinafine (LAMISIL) 250 MG tablet        No Known Allergies Follow-up Information    Follow up with  DALLDORF,PETER G, MD. Schedule an appointment as soon as possible for a visit in 2 weeks.   Specialty:  Orthopedic Surgery   Contact information:   [address] [place] [address] [phone]       Follow up with  Duane Lopeoss, Alan, MD In 1 week.   Specialty:  Family Medicine   Contact information:   [address] [place] [address] [phone]        The results of significant diagnostics from this hospitalization (including imaging, microbiology, ancillary and laboratory) are listed below for reference.    Significant Diagnostic Studies: Dg Chest 1 View  04/28/2014   CLINICAL DATA:  Acute hypoxemia.  EXAM: CHEST  1 VIEW  COMPARISON:  April 25, 2014.  FINDINGS: The heart size and mediastinal contours are within normal limits. No pneumothorax or pleural effusion is noted. Right lung is clear. Mild left basilar opacity is noted concerning for pneumonia or subsegmental atelectasis. The visualized skeletal structures are unremarkable.  IMPRESSION: Mild left basilar pneumonia or subsegmental atelectasis. Followup radiographs are recommended.   Electronically Signed   By: Lupita RaiderJames  Green Jr, M.D.   On: 04/28/2014 18:18   Pelvis Portable  04/27/2014   CLINICAL DATA:  Status post right hip replacement.  EXAM: PORTABLE PELVIS 1-2 VIEWS  COMPARISON:  Abdomen radiographs dated 03/29/2014.  FINDINGS: Right hip prosthesis in satisfactory position and alignment. No fracture or dislocation seen. Calcifications are again noted overlying the pelvis bilaterally. These are projected in different positions than previously. These may represent calcified injection granulomata.  IMPRESSION: Satisfactory postoperative appearance of a right hip prosthesis.   Electronically Signed   By: Beckie SaltsSteven  Reid M.D.   On: 04/27/2014 12:24   Dg Chest  Port 1 View  04/25/2014   CLINICAL DATA:  Larey Seat tonight. History of atrial fibrillation and bradycardia.  EXAM: PORTABLE CHEST - 1 VIEW  COMPARISON:  03/27/2014  FINDINGS:  The heart is enlarged but stable. There is tortuosity 10 mild calcification of the thoracic aorta. There are chronic bronchitic type interstitial lung changes but no acute overlying pulmonary process. Mild eventration of both hemidiaphragms. No definite pleural effusions.  IMPRESSION: No acute cardiopulmonary findings.   Electronically Signed   By: Rudie Meyer M.D.   On: 04/25/2014 22:07   Dg Hip Unilat With Pelvis 2-3 Views Right  04/25/2014   CLINICAL DATA:  Larey Seat today in the parking lot and injured right hip.  EXAM: RIGHT HIP (WITH PELVIS) 2-3 VIEWS  COMPARISON:  03/29/2014  FINDINGS: There is a displaced femoral neck fracture with shortening an lateral displacement. The left hip is intact. The pubic symphysis and SI joints are intact. No definite pelvic fractures. Stable bilateral buttock soft tissue calcifications.  IMPRESSION: Displaced right femoral neck fracture.   Electronically Signed   By: Rudie Meyer M.D.   On: 04/25/2014 21:41    Microbiology: Recent Results (from the past 240 hour(s))  Surgical pcr screen     Status: None   Collection Time: 04/26/14  5:28 PM  Result Value Ref Range Status   MRSA, PCR NEGATIVE NEGATIVE Final   Staphylococcus aureus NEGATIVE NEGATIVE Final    Comment:        The Xpert SA Assay (FDA approved for NASAL specimens in patients over 27 years of age), is one component of a comprehensive surveillance program.  Test performance has been validated by [facility] for patients greater than or equal to 6 year old. It is not intended to diagnose infection nor to guide or monitor treatment.      Labs: Basic Metabolic Panel:  Recent Labs Lab 04/25/14 2218 04/26/14 0637 04/27/14 0535 04/28/14 0650 04/29/14 0440  NA 135 132* 132* 132* 135  K 3.7 3.8 3.7 3.5 3.5  CL 100 99 101 102 103  CO2 26 24 25 26 25   GLUCOSE 127* 132* 104* 103* 85  BUN 16 15 17 16 12   CREATININE 0.72 0.68 0.77 0.62 0.53  CALCIUM 9.3 8.7 8.6 8.0* 8.3*   Liver Function  Tests:  Recent Labs Lab 04/26/14 0637  AST 35  ALT 25  ALKPHOS 64  BILITOT 0.4  PROT 6.7  ALBUMIN 4.0   No results for input(s): LIPASE, AMYLASE in the last 168 hours. No results for input(s): AMMONIA in the last 168 hours. CBC:  Recent Labs Lab 04/25/14 2218 04/26/14 0637 04/27/14 0535 04/28/14 0650 04/29/14 0440  WBC 14.7* 11.1* 9.5 8.8 8.7  NEUTROABS 13.4* 10.2*  --   --   --   HGB 12.9 12.6 12.6 11.0* 10.7*  HCT 39.3 38.0 38.5 33.4* 32.0*  MCV 92.5 92.5 93.7 94.1 93.6  PLT 273 243 261 231 257   Cardiac Enzymes: No results for input(s): CKTOTAL, CKMB, CKMBINDEX, TROPONINI in the last 168 hours. BNP: BNP (last 3 results) No results for input(s): BNP in the last 8760 hours.  ProBNP (last 3 results) No results for input(s): PROBNP in the last 8760 hours.  CBG: No results for input(s): GLUCAP in the last 168 hours.     SignedJeralyn Bennett  [facility] 04/29/2014, 2:00 PM

## 2014-04-29 NOTE — Progress Notes (Signed)
Clinical Social Work Department CLINICAL SOCIAL WORK PLACEMENT NOTE 04/29/2014  Patient:  Candace FowlerWILMOTH,Liridona  Account Number:  [id number] Admit date:  04/25/2014  Clinical Social Worker:  [name] Tecla Mailloux, LCSW  Date/time:  04/29/2014 02:51 PM  Clinical Social Work is seeking post-discharge placement for this patient at the following level of care:   SKILLED NURSING   (*CSW will update this form in Epic as items are completed)     Patient/family provided with [facility] Department of Clinical Social Work's list of facilities offering this level of care within the geographic area requested by the patient (or if unable, by the patient's family).  04/28/2014  Patient/family informed of their freedom to choose among providers that offer the needed level of care, that participate in Medicare, Medicaid or managed care program needed by the patient, have an available bed and are willing to accept the patient.    Patient/family informed of [facility]' ownership interest in [facility], as well as of the fact that they are under no obligation to receive care at this facility.  PASARR submitted to EDS on 04/28/2014 PASARR number received on 04/28/2014  FL2 transmitted to all facilities in geographic area requested by pt/family on   FL2 transmitted to all facilities within larger geographic area on   Patient informed that his/her managed care company has contracts with or will negotiate with  certain facilities, including the following:     Patient/family informed of bed offers received:  04/28/2014 [facility] Physician recommends and patient chooses bed at    [facility] on  04/29/2014 Patient to be transferred to facility by [facility] Patient and family notified of transfer on 04/29/2014 Name of family member notified:  DAUGHTER  The following physician request were entered in Epic:   Additional  Comments: Pt / daughter accept d/c to SNF today. Pt requesting to go by car. [employer] Medicare has provided prior authorization for SNF. NSG has reviewed d/c summary, scripts,avs. Scripts included in d/c packet. Packet provided to daughter prior to d/c.  [name] Abram Sax LCSW [phone]

## 2014-04-29 NOTE — Progress Notes (Signed)
CSW assisting with d/c planning. Blue medicare has provided [facility]. CSW will assist with d/c planning to SNF when medically stable for d/c.  [name] [place] LCSW [phone]

## 2014-04-29 NOTE — Progress Notes (Signed)
ANTICOAGULATION CONSULT NOTE - Initial Consult  Pharmacy Consult for Warfarin Indication: atrial fibrillation, VTE prophylaxis post-op  No Known Allergies  Patient Measurements: Height: 5' 1.5" (156.2 cm) Weight: 116 lb 6.5 oz (52.8 kg) IBW/kg (Calculated) : 48.95   Vital Signs: Temp: 98.8 F (37.1 C) (04/19 0446) Temp Source: Oral (04/19 0446) BP: 142/74 mmHg (04/19 0446) Pulse Rate: 69 (04/19 0446)  Labs:  Recent Labs  04/27/14 0535 04/28/14 0650 04/29/14 0440  HGB 12.6 11.0* 10.7*  HCT 38.5 33.4* 32.0*  PLT 261 231 257  LABPROT 14.1 14.8 18.7*  INR 1.07 1.14 1.55*  CREATININE 0.77 0.62 0.53    Estimated Creatinine Clearance: 35.4 mL/min (by C-G formula based on Cr of 0.53).   Medical History: Past Medical History  Diagnosis Date  . Bradycardia   . Alopecia   . Unspecified essential hypertension   . Atrial fibrillation     Assessment: 81 y.o F with PMH of HTN, atrial fibrillation on chronic warfarin therapy, GERD, depression, hypothyroidism who [facility]. Patient found to have closed right hip fracture by X-ray. INR supratherapeutic on admission at 3.62, which was reversed with Vitamin K 5 mg IV x 1 AM of 4/16 and 5 mg PO PM of 4/16. INR this AM 1.07, and patient underwent hemiarthroplasty of the hip on 4/17. Pharmacy consulted to assist with dosing of warfarin post-op for this patient.  Home warfarin dose: Has 2mg  tablets at home. Warfarin 4 mg on Mon/Wed/Fri, 3 mg on Tues/Thurs/Sat/Sun (verified with patient and [facility])  Today, 04/29/2014:  INR up 1.55  hgb down 10.7, plt ok  Ate 75% of dinner on 4/18, none recorded today  New drug interactions: ceftin  Goal of Therapy:  INR 2-3 Monitor platelets by anticoagulation protocol: Yes   Plan:   Warfarin 3 mg PO x 1.  Daily PT/INR.  Continue Lovenox 40mg  SQ q24h until INR >/= 1.8 per MD order.  Monitor CBC and for signs/symptoms of bleeding.  Dorna LeitzAnh Amare Bail, PharmD,  BCPS 04/29/2014 12:50 PM

## 2014-04-29 NOTE — Progress Notes (Signed)
Subjective: 2 Days Post-Op Procedure(s) (LRB): ARTHROPLASTY CEMENTED HEMI HIP (Right)   Patient is resting comfortably in bed. She is seen today with the internal medicine doctor.   Activity level:  wbat with posterior hip precautions Diet tolerance:  ok Voiding:  ok Patient reports pain as mild.    Objective: Vital signs in last 24 hours: Temp:  [97.9 F (36.6 C)-98.8 F (37.1 C)] 98.8 F (37.1 C) (04/19 0446) Pulse Rate:  [56-98] 69 (04/19 0446) Resp:  [18-20] 20 (04/19 0446) BP: (121-142)/(64-74) 142/74 mmHg (04/19 0446) SpO2:  [94 %-100 %] 96 % (04/19 0446)  Labs:  Recent Labs  04/27/14 0535 04/28/14 0650 04/29/14 0440  HGB 12.6 11.0* 10.7*    Recent Labs  04/28/14 0650 04/29/14 0440  WBC 8.8 8.7  RBC 3.55* 3.42*  HCT 33.4* 32.0*  PLT 231 257    Recent Labs  04/28/14 0650 04/29/14 0440  NA 132* 135  K 3.5 3.5  CL 102 103  CO2 26 25  BUN 16 12  CREATININE 0.62 0.53  GLUCOSE 103* 85  CALCIUM 8.0* 8.3*    Recent Labs  04/28/14 0650 04/29/14 0440  INR 1.14 1.55*    Physical Exam:  Neurologically intact ABD soft Neurovascular intact Sensation intact distally Intact pulses distally Dorsiflexion/Plantar flexion intact Incision: dressing C/D/I No cellulitis present Compartment soft  Assessment/Plan:  2 Days Post-Op Procedure(s) (LRB): ARTHROPLASTY CEMENTED HEMI HIP (Right) Up with therapy  After speaking with internal medicine doctor we both believe that she is ready to d/c to SNF today. She will continue on her coumadin for DVT prevention. Continue hydrocodone for pain. Continue WBAT with posterior hip precautions. Follow up in office 10-14 days.     Cadance Raus, Ginger OrganNDREW PAUL 04/29/2014, 1:49 PM

## 2014-04-29 NOTE — Progress Notes (Signed)
Physical Therapy Treatment Patient Details Name: Candace Cain MRN: [id number] DOB: 1920-01-27 Today's Date: 04/29/2014    History of Present Illness s/p R hip hemiarthroplasty    PT Comments    POD # 2 am session.  Pt OOB in recliner feeling "good".  Daughter in room.  Reviewed THP by asking pt to bend over and fix sock.  She attempted so re directed/enforced no hip flex > 90 degrees.  Pt was able to verbalize this but required instruction practically.  Pt verbalized the other 2 precautions. Assisted out of recliner to amb.  Instructed on WBAT as pt was uaware.  Assisted with amb in hallway, proper gait sequencing and AD use.  Returned to room and applied ICE.    Follow Up Recommendations  SNF [facility]     Equipment Recommendations  Rolling walker with 5" wheels    Recommendations for Other Services       Precautions / Restrictions Precautions Precautions: Fall;Posterior Hip Precaution Comments: reviewed precautions with pt/daughter Restrictions Weight Bearing Restrictions: No Other Position/Activity Restrictions: WBAT RLE    Mobility  Bed Mobility               General bed mobility comments: Pt OOB in recliner  Transfers Overall transfer level: Needs assistance Equipment used: Rolling walker (2 wheeled) Transfers: Sit to/from Stand           General transfer comment: 50% VC's on proper tech, R LE advancement, hand placement and to avoid hip flexion > 90 degrees.  25% VC's on safety with turns to avoid internal rotation.   Ambulation/Gait Ambulation/Gait assistance: Min assist Ambulation Distance (Feet): 48 Feet Assistive device: Rolling walker (2 wheeled) Gait Pattern/deviations: Step-to pattern Gait velocity: decreased   General Gait Details: 50% VC's on proper walker to self placement, proper R LE placement to avoid stepping past her hands and cueing on upright posture.     Stairs            Wheelchair Mobility    Modified Rankin  (Stroke Patients Only)       Balance                                    Cognition                            Exercises      General Comments        Pertinent Vitals/Pain Pain Assessment: 0-10 Pain Location: a little tightness Pain Descriptors / Indicators: Tightness Pain Intervention(s): Monitored during session;Repositioned;Ice applied    Home Living                      Prior Function            PT Goals (current goals can now be found in the care plan section) Progress towards PT goals: Progressing toward goals    Frequency  Min 5X/week    PT Plan      Co-evaluation             End of Session Equipment Utilized During Treatment: Gait belt Activity Tolerance: Patient tolerated treatment well Patient left: in chair;with call bell/phone within reach;with family/visitor present     Time: 0925-0955 PT Time Calculation (min) (ACUTE ONLY): 30 min  Charges:  $Gait Training: 8-22 mins $Therapeutic Activity: 8-22 mins  G Codes:      Candace Cain  PTA [facility]  Acute  Rehab Pager      [phone]

## 2015-03-19 DIAGNOSIS — R21 Rash and other nonspecific skin eruption: Secondary | ICD-10-CM | POA: Diagnosis not present

## 2015-03-19 DIAGNOSIS — Z7901 Long term (current) use of anticoagulants: Secondary | ICD-10-CM | POA: Diagnosis not present

## 2015-03-19 DIAGNOSIS — I10 Essential (primary) hypertension: Secondary | ICD-10-CM | POA: Diagnosis not present

## 2015-04-07 DIAGNOSIS — R8290 Unspecified abnormal findings in urine: Secondary | ICD-10-CM | POA: Diagnosis not present

## 2015-04-07 DIAGNOSIS — F324 Major depressive disorder, single episode, in partial remission: Secondary | ICD-10-CM | POA: Diagnosis not present

## 2015-04-07 DIAGNOSIS — E039 Hypothyroidism, unspecified: Secondary | ICD-10-CM | POA: Diagnosis not present

## 2015-04-07 DIAGNOSIS — Z0000 Encounter for general adult medical examination without abnormal findings: Secondary | ICD-10-CM | POA: Diagnosis not present

## 2015-04-07 DIAGNOSIS — I10 Essential (primary) hypertension: Secondary | ICD-10-CM | POA: Diagnosis not present

## 2015-04-07 DIAGNOSIS — Z7901 Long term (current) use of anticoagulants: Secondary | ICD-10-CM | POA: Diagnosis not present

## 2015-04-07 DIAGNOSIS — R609 Edema, unspecified: Secondary | ICD-10-CM | POA: Diagnosis not present

## 2015-04-07 DIAGNOSIS — I482 Chronic atrial fibrillation: Secondary | ICD-10-CM | POA: Diagnosis not present

## 2015-04-20 DIAGNOSIS — M25551 Pain in right hip: Secondary | ICD-10-CM | POA: Diagnosis not present

## 2015-04-20 DIAGNOSIS — M25561 Pain in right knee: Secondary | ICD-10-CM | POA: Diagnosis not present

## 2015-04-28 DIAGNOSIS — R262 Difficulty in walking, not elsewhere classified: Secondary | ICD-10-CM | POA: Diagnosis not present

## 2015-04-28 DIAGNOSIS — M25551 Pain in right hip: Secondary | ICD-10-CM | POA: Diagnosis not present

## 2015-05-05 DIAGNOSIS — M25551 Pain in right hip: Secondary | ICD-10-CM | POA: Diagnosis not present

## 2015-05-05 DIAGNOSIS — R262 Difficulty in walking, not elsewhere classified: Secondary | ICD-10-CM | POA: Diagnosis not present

## 2015-05-07 DIAGNOSIS — R262 Difficulty in walking, not elsewhere classified: Secondary | ICD-10-CM | POA: Diagnosis not present

## 2015-05-07 DIAGNOSIS — M25551 Pain in right hip: Secondary | ICD-10-CM | POA: Diagnosis not present

## 2015-05-08 DIAGNOSIS — B351 Tinea unguium: Secondary | ICD-10-CM | POA: Diagnosis not present

## 2015-05-19 DIAGNOSIS — R262 Difficulty in walking, not elsewhere classified: Secondary | ICD-10-CM | POA: Diagnosis not present

## 2015-05-19 DIAGNOSIS — M25551 Pain in right hip: Secondary | ICD-10-CM | POA: Diagnosis not present

## 2015-05-20 DIAGNOSIS — M25551 Pain in right hip: Secondary | ICD-10-CM | POA: Diagnosis not present

## 2015-05-26 DIAGNOSIS — R262 Difficulty in walking, not elsewhere classified: Secondary | ICD-10-CM | POA: Diagnosis not present

## 2015-05-26 DIAGNOSIS — M25551 Pain in right hip: Secondary | ICD-10-CM | POA: Diagnosis not present

## 2015-05-29 DIAGNOSIS — M25551 Pain in right hip: Secondary | ICD-10-CM | POA: Diagnosis not present

## 2015-05-29 DIAGNOSIS — M7061 Trochanteric bursitis, right hip: Secondary | ICD-10-CM | POA: Diagnosis not present

## 2015-06-03 DIAGNOSIS — I482 Chronic atrial fibrillation: Secondary | ICD-10-CM | POA: Diagnosis not present

## 2015-06-03 DIAGNOSIS — I10 Essential (primary) hypertension: Secondary | ICD-10-CM | POA: Diagnosis not present

## 2015-06-03 DIAGNOSIS — Z7901 Long term (current) use of anticoagulants: Secondary | ICD-10-CM | POA: Diagnosis not present

## 2015-06-03 DIAGNOSIS — F325 Major depressive disorder, single episode, in full remission: Secondary | ICD-10-CM | POA: Diagnosis not present

## 2015-06-03 DIAGNOSIS — B351 Tinea unguium: Secondary | ICD-10-CM | POA: Diagnosis not present

## 2015-07-08 DIAGNOSIS — H04123 Dry eye syndrome of bilateral lacrimal glands: Secondary | ICD-10-CM | POA: Diagnosis not present

## 2015-07-08 DIAGNOSIS — H353131 Nonexudative age-related macular degeneration, bilateral, early dry stage: Secondary | ICD-10-CM | POA: Diagnosis not present

## 2015-07-08 DIAGNOSIS — Z961 Presence of intraocular lens: Secondary | ICD-10-CM | POA: Diagnosis not present

## 2015-07-27 DIAGNOSIS — M7061 Trochanteric bursitis, right hip: Secondary | ICD-10-CM | POA: Diagnosis not present

## 2015-07-27 DIAGNOSIS — M85511 Aneurysmal bone cyst, right shoulder: Secondary | ICD-10-CM | POA: Diagnosis not present

## 2015-08-04 DIAGNOSIS — Z7901 Long term (current) use of anticoagulants: Secondary | ICD-10-CM | POA: Diagnosis not present

## 2015-08-25 DIAGNOSIS — Z7901 Long term (current) use of anticoagulants: Secondary | ICD-10-CM | POA: Diagnosis not present

## 2015-09-25 DIAGNOSIS — Z7901 Long term (current) use of anticoagulants: Secondary | ICD-10-CM | POA: Diagnosis not present

## 2015-10-05 DIAGNOSIS — Z23 Encounter for immunization: Secondary | ICD-10-CM | POA: Diagnosis not present

## 2015-10-05 DIAGNOSIS — I509 Heart failure, unspecified: Secondary | ICD-10-CM | POA: Diagnosis not present

## 2015-10-20 ENCOUNTER — Encounter: Payer: Self-pay | Admitting: Cardiology

## 2015-10-20 ENCOUNTER — Ambulatory Visit (INDEPENDENT_AMBULATORY_CARE_PROVIDER_SITE_OTHER): Payer: Medicare Other | Admitting: Cardiology

## 2015-10-20 ENCOUNTER — Encounter (INDEPENDENT_AMBULATORY_CARE_PROVIDER_SITE_OTHER): Payer: Self-pay

## 2015-10-20 VITALS — BP 124/82 | HR 65 | Ht 61.5 in | Wt 129.4 lb

## 2015-10-20 DIAGNOSIS — I480 Paroxysmal atrial fibrillation: Secondary | ICD-10-CM

## 2015-10-20 NOTE — Patient Instructions (Signed)
Medication Instructions:    Your physician recommends that you continue on your current medications as directed. Please refer to the Current Medication list given to you today.  - If you need a refill on your cardiac medications before your next appointment, please call your pharmacy.   Labwork:  None ordered  Testing/Procedures:  None ordered  Follow-Up:  Your physician recommends that you schedule a follow-up appointment in: 3 months with Dr. Camnitz.  Thank you for choosing [facility]!!   Jinnie Onley, RN [phone]         

## 2015-10-20 NOTE — Progress Notes (Signed)
Electrophysiology Office Note   Date:  10/20/2015   ID:  Candace Cain, DOB 02-Apr-1920, MRN [id number]  PCP:   Candace Cain Primary Electrophysiologist:  Candace Cain    Chief Complaint  Patient presents with  . Advice Only    Afib     History of Present Illness: Candace Cain is a 82 y.o. female who presents today for electrophysiology evaluation.   History of atrial fibrillation and hypertension. She presents today for evaluation of her atrial fibrillation as well as some swelling and shortness of breath at night. She previously saw her primary physician who put her on 20 mg of Lasix a day. She says that she has lost 4 pounds since that time. She is 6 pounds away from her normal weight os on her home scale. She is feeling much better with quite a bit less shortness of breath and lower extremity edema since starting the Lasix.   Today, she denies symptoms of palpitations, chest pain, shortness of breath, orthopnea, PND, lower extremity edema, claudication, dizziness, presyncope, syncope, bleeding, or neurologic sequela. The patient is tolerating medications without difficulties and is otherwise without complaint today.    Past Medical History:  Diagnosis Date  . Alopecia   . Atrial fibrillation (HCC)   . Bradycardia   . Unspecified essential hypertension    Past Surgical History:  Procedure Laterality Date  . HIP ARTHROPLASTY Right 04/27/2014   Procedure: ARTHROPLASTY CEMENTED HEMI HIP;  Surgeon: Candace CorningPeter Dalldorf, Cain;  Location: [facility];  Service: Orthopedics;  Laterality: Right;  . hysterectomy, unspecifed area       Current Outpatient Prescriptions  Medication Sig Dispense Refill  . amLODipine (NORVASC) 5 MG tablet Take 10 mg by mouth daily.     . Calcium Carbonate-Vitamin D (TH CALCIUM CARBONATE-VITAMIN D) 600-400 MG-UNIT per tablet Take 2 tablets by mouth daily.      . furosemide (LASIX) 20 MG tablet Take 20 mg by mouth daily.    Marland Kitchen. levothyroxine (SYNTHROID,  LEVOTHROID) 25 MCG tablet Take 25 mcg by mouth daily before breakfast.    . losartan (COZAAR) 100 MG tablet Take 100 mg by mouth daily.    Marland Kitchen. omeprazole (PRILOSEC) 10 MG capsule Take 10 mg by mouth daily as needed (heart burn).    . vitamin B-12 (CYANOCOBALAMIN) 1000 MCG tablet Take 1,000 mcg by mouth daily.    Marland Kitchen. warfarin (COUMADIN) 3 MG tablet Take 1 tablet (3 mg total) by mouth daily. 30 tablet 1   No current facility-administered medications for this visit.     Allergies:   Review of patient's allergies indicates no known allergies.   Social History:  The patient  reports that she has quit smoking. She has never used smokeless tobacco. She reports that she drinks alcohol. She reports that she does not use drugs.   Family History:  The patient's family history includes Cancer in her maternal grandfather; Colon cancer in her mother; Heart disease in her brother, father, and sister; Prostate cancer in her brother.    ROS:  Please see the history of present illness.   Otherwise, review of systems is positive for Weight change, leg swelling, waking up at night short of eath, shortness of breath lying do, chest pressure, leg pain, palpitations, hearing loss, visual disturbance, cough, dyspnea on exertion, wheezing, diarrhea, rash, balance problems, easy bruising.   All other systems are reviewed and negative.    PHYSICAL EXAM: VS:  BP 124/82   Pulse 65   Ht  5' 1.5" (1.562 m)   Wt 129 lb 6.4 oz (58.7 kg)   BMI 24.05 kg/m  , BMI Body mass index is 24.05 kg/m. GEN: Well nourished, well developed, in no acute distress  HEENT: normal  Neck: no JVD, carotid bruits, or masses Cardiac: iRRR; no murmurs, rubs, or gallops,no edema  Respiratory:  clear to auscultation bilaterally, normal work of breathing GI: soft, nontender, nondistended, + BS MS: no deformity or atrophy  Skin: warm and dry Neuro:  Strength and sensation are intact Psych: euthymic mood, full affect  EKG:  EKG is ordered  today. Personal review of the ekg ordered shows sinus rhythm, APCs, 1 degree AV block  Recent Labs: No results found for requested labs within last 8760 hours.    Lipid Panel     Component Value Date/Time   CHOL 179 06/05/2010 0420   TRIG 82 06/05/2010 0420   HDL 88 06/05/2010 0420   CHOLHDL 2.0 06/05/2010 0420   VLDL 16 06/05/2010 0420   LDLCALC  06/05/2010 0420    75        Total Cholesterol/HDL:CHD Risk Coronary Heart Disease Risk Table                     Men   Women  1/2 Average Risk   3.4   3.3  Average Risk       5.0   4.4  2 X Average Risk   9.6   7.1  3 X Average Risk  23.4   11.0        Use the calculated Patient Ratio above and the CHD Risk Table to determine the patient's CHD Risk.        ATP III CLASSIFICATION (LDL):  <100     mg/dL   Optimal  161-096  mg/dL   Near or Above                    Optimal  130-159  mg/dL   Borderline  045-409  mg/dL   High  >811     mg/dL   Very High     Wt Readings from Last 3 Encounters:  10/20/15 129 lb 6.4 oz (58.7 kg)  04/26/14 116 lb 6.5 oz (52.8 kg)  03/28/14 116 lb (52.6 kg)      Other studies Reviewed: Additional studies/ records that were reviewed today include: PCP notes   ASSESSMENT AND PLAN:  1.  Atrial fibrillation: on warfarin for anticoagulation. Currently in sinus rhythm today. We did discuss the option of further therapy with antiarrhythmic medications to keep her in sinus rhythm, though she does not feel that this is necessary at this time.  This patients CHA2DS2-VASc Score and unadjusted Ischemic Stroke Rate (% per year) is equal to 4.8 % stroke rate/year from a score of 4  Above score calculated as 1 point each if present [CHF, HTN, DM, Vascular=MI/PAD/Aortic Plaque, Age if 65-74, or Female] Above score calculated as 2 points each if present [Age > 75, or Stroke/TIA/TE]   2. Hypertension: Well controlled today  3. Lower extremity edema: possibly due to heart failure versus venous stasis and  venous disease. I discussed the possibility of an echocardiogram, but she feels that therapy with Lasix has been improving her symptoms and therefore she declines at this time.    Current medicines are reviewed at length with the patient today.   The patient does not have concerns regarding her medicines.  The following changes were made  today:  none  Labs/ tests ordered today include:  No orders of the defined types were placed in this encounter.    Disposition:   FU with Sylva Overley 3 months  Signed, Janella Rogala Candace Loa, Cain  10/20/2015 10:21 AM     [facility] [address] [place] [address] [phone] (office) [phone] (fax)

## 2015-11-04 DIAGNOSIS — E039 Hypothyroidism, unspecified: Secondary | ICD-10-CM | POA: Diagnosis not present

## 2015-11-04 DIAGNOSIS — Z7901 Long term (current) use of anticoagulants: Secondary | ICD-10-CM | POA: Diagnosis not present

## 2015-11-04 DIAGNOSIS — Z79899 Other long term (current) drug therapy: Secondary | ICD-10-CM | POA: Diagnosis not present

## 2015-11-04 DIAGNOSIS — R609 Edema, unspecified: Secondary | ICD-10-CM | POA: Diagnosis not present

## 2015-11-04 DIAGNOSIS — G479 Sleep disorder, unspecified: Secondary | ICD-10-CM | POA: Diagnosis not present

## 2015-11-04 DIAGNOSIS — I482 Chronic atrial fibrillation: Secondary | ICD-10-CM | POA: Diagnosis not present

## 2015-11-26 DIAGNOSIS — Z7901 Long term (current) use of anticoagulants: Secondary | ICD-10-CM | POA: Diagnosis not present

## 2015-11-26 DIAGNOSIS — G629 Polyneuropathy, unspecified: Secondary | ICD-10-CM | POA: Diagnosis not present

## 2015-11-26 DIAGNOSIS — I482 Chronic atrial fibrillation: Secondary | ICD-10-CM | POA: Diagnosis not present

## 2015-11-26 DIAGNOSIS — R200 Anesthesia of skin: Secondary | ICD-10-CM | POA: Diagnosis not present

## 2015-12-11 DIAGNOSIS — M7061 Trochanteric bursitis, right hip: Secondary | ICD-10-CM | POA: Diagnosis not present

## 2015-12-11 DIAGNOSIS — M25511 Pain in right shoulder: Secondary | ICD-10-CM | POA: Diagnosis not present

## 2015-12-16 DIAGNOSIS — G629 Polyneuropathy, unspecified: Secondary | ICD-10-CM | POA: Diagnosis not present

## 2015-12-16 DIAGNOSIS — M7061 Trochanteric bursitis, right hip: Secondary | ICD-10-CM | POA: Diagnosis not present

## 2015-12-24 ENCOUNTER — Emergency Department (HOSPITAL_COMMUNITY): Payer: Medicare Other

## 2015-12-24 ENCOUNTER — Inpatient Hospital Stay (HOSPITAL_COMMUNITY)
Admission: EM | Admit: 2015-12-24 | Discharge: 2016-01-03 | DRG: 329 | Disposition: A | Discharge status: Skilled Nursing Facility | Payer: Medicare Other | Attending: Internal Medicine | Admitting: Internal Medicine

## 2015-12-24 ENCOUNTER — Encounter (HOSPITAL_COMMUNITY): Payer: Self-pay

## 2015-12-24 DIAGNOSIS — E876 Hypokalemia: Secondary | ICD-10-CM | POA: Diagnosis present

## 2015-12-24 DIAGNOSIS — Z7901 Long term (current) use of anticoagulants: Secondary | ICD-10-CM

## 2015-12-24 DIAGNOSIS — N179 Acute kidney failure, unspecified: Secondary | ICD-10-CM | POA: Diagnosis present

## 2015-12-24 DIAGNOSIS — Z6825 Body mass index (BMI) 25.0-25.9, adult: Secondary | ICD-10-CM

## 2015-12-24 DIAGNOSIS — E870 Hyperosmolality and hypernatremia: Secondary | ICD-10-CM | POA: Diagnosis not present

## 2015-12-24 DIAGNOSIS — Z96641 Presence of right artificial hip joint: Secondary | ICD-10-CM | POA: Diagnosis present

## 2015-12-24 DIAGNOSIS — K659 Peritonitis, unspecified: Secondary | ICD-10-CM | POA: Diagnosis not present

## 2015-12-24 DIAGNOSIS — R278 Other lack of coordination: Secondary | ICD-10-CM | POA: Diagnosis not present

## 2015-12-24 DIAGNOSIS — K5732 Diverticulitis of large intestine without perforation or abscess without bleeding: Secondary | ICD-10-CM | POA: Diagnosis not present

## 2015-12-24 DIAGNOSIS — R112 Nausea with vomiting, unspecified: Secondary | ICD-10-CM | POA: Diagnosis not present

## 2015-12-24 DIAGNOSIS — I4891 Unspecified atrial fibrillation: Secondary | ICD-10-CM | POA: Diagnosis present

## 2015-12-24 DIAGNOSIS — E43 Unspecified severe protein-calorie malnutrition: Secondary | ICD-10-CM | POA: Diagnosis not present

## 2015-12-24 DIAGNOSIS — Z66 Do not resuscitate: Secondary | ICD-10-CM | POA: Diagnosis present

## 2015-12-24 DIAGNOSIS — G4700 Insomnia, unspecified: Secondary | ICD-10-CM | POA: Diagnosis not present

## 2015-12-24 DIAGNOSIS — I480 Paroxysmal atrial fibrillation: Secondary | ICD-10-CM | POA: Diagnosis not present

## 2015-12-24 DIAGNOSIS — K650 Generalized (acute) peritonitis: Secondary | ICD-10-CM | POA: Diagnosis present

## 2015-12-24 DIAGNOSIS — K5721 Diverticulitis of large intestine with perforation and abscess with bleeding: Principal | ICD-10-CM | POA: Diagnosis present

## 2015-12-24 DIAGNOSIS — I10 Essential (primary) hypertension: Secondary | ICD-10-CM | POA: Diagnosis present

## 2015-12-24 DIAGNOSIS — E039 Hypothyroidism, unspecified: Secondary | ICD-10-CM | POA: Diagnosis not present

## 2015-12-24 DIAGNOSIS — R062 Wheezing: Secondary | ICD-10-CM

## 2015-12-24 DIAGNOSIS — K567 Ileus, unspecified: Secondary | ICD-10-CM | POA: Diagnosis not present

## 2015-12-24 DIAGNOSIS — M5134 Other intervertebral disc degeneration, thoracic region: Secondary | ICD-10-CM | POA: Diagnosis present

## 2015-12-24 DIAGNOSIS — Z4682 Encounter for fitting and adjustment of non-vascular catheter: Secondary | ICD-10-CM | POA: Diagnosis not present

## 2015-12-24 DIAGNOSIS — K219 Gastro-esophageal reflux disease without esophagitis: Secondary | ICD-10-CM | POA: Diagnosis not present

## 2015-12-24 DIAGNOSIS — Z433 Encounter for attention to colostomy: Secondary | ICD-10-CM | POA: Diagnosis not present

## 2015-12-24 DIAGNOSIS — Z87891 Personal history of nicotine dependence: Secondary | ICD-10-CM

## 2015-12-24 DIAGNOSIS — Z9889 Other specified postprocedural states: Secondary | ICD-10-CM

## 2015-12-24 DIAGNOSIS — K56609 Unspecified intestinal obstruction, unspecified as to partial versus complete obstruction: Secondary | ICD-10-CM | POA: Diagnosis present

## 2015-12-24 DIAGNOSIS — Z79899 Other long term (current) drug therapy: Secondary | ICD-10-CM | POA: Diagnosis not present

## 2015-12-24 DIAGNOSIS — R262 Difficulty in walking, not elsewhere classified: Secondary | ICD-10-CM | POA: Diagnosis not present

## 2015-12-24 DIAGNOSIS — K631 Perforation of intestine (nontraumatic): Secondary | ICD-10-CM | POA: Diagnosis not present

## 2015-12-24 DIAGNOSIS — M6281 Muscle weakness (generalized): Secondary | ICD-10-CM

## 2015-12-24 DIAGNOSIS — D72829 Elevated white blood cell count, unspecified: Secondary | ICD-10-CM

## 2015-12-24 DIAGNOSIS — R188 Other ascites: Secondary | ICD-10-CM | POA: Diagnosis not present

## 2015-12-24 DIAGNOSIS — E569 Vitamin deficiency, unspecified: Secondary | ICD-10-CM | POA: Diagnosis not present

## 2015-12-24 DIAGNOSIS — E038 Other specified hypothyroidism: Secondary | ICD-10-CM | POA: Diagnosis not present

## 2015-12-24 DIAGNOSIS — K5669 Other partial intestinal obstruction: Secondary | ICD-10-CM | POA: Diagnosis not present

## 2015-12-24 DIAGNOSIS — Z48815 Encounter for surgical aftercare following surgery on the digestive system: Secondary | ICD-10-CM | POA: Diagnosis not present

## 2015-12-24 DIAGNOSIS — R109 Unspecified abdominal pain: Secondary | ICD-10-CM | POA: Diagnosis not present

## 2015-12-24 DIAGNOSIS — K2970 Gastritis, unspecified, without bleeding: Secondary | ICD-10-CM | POA: Diagnosis not present

## 2015-12-24 DIAGNOSIS — K5720 Diverticulitis of large intestine with perforation and abscess without bleeding: Secondary | ICD-10-CM | POA: Insufficient documentation

## 2015-12-24 DIAGNOSIS — R0602 Shortness of breath: Secondary | ICD-10-CM | POA: Diagnosis not present

## 2015-12-24 DIAGNOSIS — Z0189 Encounter for other specified special examinations: Secondary | ICD-10-CM

## 2015-12-24 HISTORY — DX: Diverticulitis of large intestine with perforation and abscess without bleeding: K57.20

## 2015-12-24 HISTORY — DX: Unspecified intestinal obstruction, unspecified as to partial versus complete obstruction: K56.609

## 2015-12-24 LAB — URINALYSIS, ROUTINE W REFLEX MICROSCOPIC
Bilirubin Urine: NEGATIVE
Glucose, UA: NEGATIVE mg/dL
Ketones, ur: 20 mg/dL — AB
NITRITE: NEGATIVE
PH: 5 (ref 5.0–8.0)
Protein, ur: 30 mg/dL — AB
SPECIFIC GRAVITY, URINE: 1.04 — AB (ref 1.005–1.030)

## 2015-12-24 LAB — CBC
HEMATOCRIT: 41.2 % (ref 36.0–46.0)
HEMOGLOBIN: 13.9 g/dL (ref 12.0–15.0)
MCH: 30 pg (ref 26.0–34.0)
MCHC: 33.7 g/dL (ref 30.0–36.0)
MCV: 89 fL (ref 78.0–100.0)
Platelets: 338 10*3/uL (ref 150–400)
RBC: 4.63 MIL/uL (ref 3.87–5.11)
RDW: 14.3 % (ref 11.5–15.5)
WBC: 31.7 10*3/uL — AB (ref 4.0–10.5)

## 2015-12-24 LAB — COMPREHENSIVE METABOLIC PANEL
ALBUMIN: 3.7 g/dL (ref 3.5–5.0)
ALK PHOS: 106 U/L (ref 38–126)
ALT: 16 U/L (ref 14–54)
ANION GAP: 15 (ref 5–15)
AST: 24 U/L (ref 15–41)
BILIRUBIN TOTAL: 0.9 mg/dL (ref 0.3–1.2)
BUN: 34 mg/dL — ABNORMAL HIGH (ref 6–20)
CALCIUM: 9.3 mg/dL (ref 8.9–10.3)
CO2: 24 mmol/L (ref 22–32)
Chloride: 96 mmol/L — ABNORMAL LOW (ref 101–111)
Creatinine, Ser: 1.2 mg/dL — ABNORMAL HIGH (ref 0.44–1.00)
GFR, EST AFRICAN AMERICAN: 44 mL/min — AB (ref >60)
GFR, EST NON AFRICAN AMERICAN: 38 mL/min — AB (ref >60)
Glucose, Bld: 109 mg/dL — ABNORMAL HIGH (ref 65–99)
POTASSIUM: 4 mmol/L (ref 3.5–5.1)
Sodium: 135 mmol/L (ref 135–145)
TOTAL PROTEIN: 7.2 g/dL (ref 6.5–8.1)

## 2015-12-24 LAB — PROTIME-INR
INR: 2.96
PROTHROMBIN TIME: 31.4 s — AB (ref 11.4–15.2)

## 2015-12-24 LAB — I-STAT CG4 LACTIC ACID, ED
LACTIC ACID, VENOUS: 0.9 mmol/L (ref 0.5–1.9)
LACTIC ACID, VENOUS: 0.64 mmol/L (ref 0.5–1.9)

## 2015-12-24 LAB — LIPASE, BLOOD: Lipase: 19 U/L (ref 11–51)

## 2015-12-24 MED ORDER — ONDANSETRON HCL 4 MG/2ML IJ SOLN
4.0000 mg | Freq: Once | INTRAMUSCULAR | Status: AC
  Administered 2015-12-24: 4 mg via INTRAVENOUS
  Filled 2015-12-24: qty 2

## 2015-12-24 MED ORDER — HYDRALAZINE HCL 20 MG/ML IJ SOLN
5.0000 mg | Freq: Four times a day (QID) | INTRAMUSCULAR | Status: DC | PRN
  Administered 2015-12-26 – 2015-12-29 (×2): 5 mg via INTRAVENOUS
  Filled 2015-12-24 (×2): qty 1

## 2015-12-24 MED ORDER — ONDANSETRON HCL 4 MG/2ML IJ SOLN
4.0000 mg | Freq: Once | INTRAMUSCULAR | Status: AC | PRN
  Administered 2015-12-24: 4 mg via INTRAVENOUS
  Filled 2015-12-24: qty 2

## 2015-12-24 MED ORDER — SODIUM CHLORIDE 0.45 % IV SOLN
INTRAVENOUS | Status: DC
  Administered 2015-12-24: 14:00:00 via INTRAVENOUS

## 2015-12-24 MED ORDER — MORPHINE SULFATE (PF) 4 MG/ML IV SOLN
4.0000 mg | Freq: Once | INTRAVENOUS | Status: AC
  Administered 2015-12-24: 4 mg via INTRAVENOUS
  Filled 2015-12-24: qty 1

## 2015-12-24 MED ORDER — PIPERACILLIN-TAZOBACTAM 3.375 G IVPB 30 MIN
3.3750 g | Freq: Once | INTRAVENOUS | Status: AC
  Administered 2015-12-24: 3.375 g via INTRAVENOUS
  Filled 2015-12-24: qty 50

## 2015-12-24 MED ORDER — PIPERACILLIN-TAZOBACTAM 3.375 G IVPB
3.3750 g | Freq: Three times a day (TID) | INTRAVENOUS | Status: DC
  Administered 2015-12-25 – 2016-01-03 (×29): 3.375 g via INTRAVENOUS
  Filled 2015-12-24 (×29): qty 50

## 2015-12-24 MED ORDER — IOPAMIDOL (ISOVUE-300) INJECTION 61%
100.0000 mL | Freq: Once | INTRAVENOUS | Status: AC | PRN
  Administered 2015-12-24: 60 mL via INTRAVENOUS

## 2015-12-24 MED ORDER — SODIUM CHLORIDE 0.9 % IJ SOLN
INTRAMUSCULAR | Status: AC
  Filled 2015-12-24: qty 50

## 2015-12-24 MED ORDER — DEXTROSE-NACL 5-0.45 % IV SOLN
INTRAVENOUS | Status: DC
  Administered 2015-12-24 – 2015-12-25 (×2): via INTRAVENOUS

## 2015-12-24 MED ORDER — OXYMETAZOLINE HCL 0.05 % NA SOLN
1.0000 | Freq: Two times a day (BID) | NASAL | Status: DC
  Administered 2015-12-24: 1 via NASAL
  Filled 2015-12-24: qty 15

## 2015-12-24 MED ORDER — ONDANSETRON HCL 4 MG/2ML IJ SOLN
4.0000 mg | Freq: Four times a day (QID) | INTRAMUSCULAR | Status: DC | PRN
  Administered 2015-12-27: 4 mg via INTRAVENOUS
  Filled 2015-12-24: qty 2

## 2015-12-24 MED ORDER — PHENOL 1.4 % MT LIQD
1.0000 | OROMUCOSAL | Status: DC | PRN
  Filled 2015-12-24: qty 177

## 2015-12-24 MED ORDER — MORPHINE SULFATE (PF) 4 MG/ML IV SOLN
4.0000 mg | INTRAVENOUS | Status: DC | PRN
  Administered 2015-12-25: 4 mg via INTRAVENOUS
  Filled 2015-12-24 (×2): qty 1

## 2015-12-24 MED ORDER — SODIUM CHLORIDE 0.9 % IV BOLUS (SEPSIS)
1000.0000 mL | Freq: Once | INTRAVENOUS | Status: AC
  Administered 2015-12-24: 1000 mL via INTRAVENOUS

## 2015-12-24 MED ORDER — IOPAMIDOL (ISOVUE-300) INJECTION 61%
INTRAVENOUS | Status: AC
  Filled 2015-12-24: qty 100

## 2015-12-24 MED ORDER — LEVOTHYROXINE SODIUM 100 MCG IV SOLR
12.5000 ug | Freq: Every day | INTRAVENOUS | Status: DC
  Administered 2015-12-25 – 2016-01-01 (×8): 12.5 ug via INTRAVENOUS
  Filled 2015-12-24 (×9): qty 5

## 2015-12-24 MED ORDER — LORAZEPAM 2 MG/ML IJ SOLN
0.5000 mg | Freq: Once | INTRAMUSCULAR | Status: AC
  Administered 2015-12-24: 0.5 mg via INTRAVENOUS
  Filled 2015-12-24: qty 1

## 2015-12-24 MED ORDER — SODIUM CHLORIDE 0.45 % IV SOLN: INTRAVENOUS | Status: DC

## 2015-12-24 NOTE — ED Notes (Signed)
Hospitalist at bedside 

## 2015-12-24 NOTE — Progress Notes (Signed)
Pharmacy Antibiotic Note  Candace Cain is a 82 y.o. female admitted on 12/24/2015 with intra-abdominal infection.  Pharmacy has been consulted for Zosyn dosing.  Plan: Zosyn 3.375g IV q8h (4 hour infusion).  Height: 5' 1.5" (156.2 cm) Weight: 127 lb (57.6 kg) IBW/kg (Calculated) : 48.95  Temp (24hrs), Avg:97.8 F (36.6 C), Min:97.8 F (36.6 C), Max:97.8 F (36.6 C)   Recent Labs Lab 12/24/15 0850  WBC 31.7*  CREATININE 1.20*    Estimated Creatinine Clearance: 22.7 mL/min (by C-G formula based on SCr of 1.2 mg/dL (H)).    No Known Allergies  Antimicrobials this admission: 12/14 Zosyn >>    Dose adjustments this admission: ---  Microbiology results: 12/14 BCx: sent   Thank you for allowing pharmacy to be a part of this patient's care.   Candace Cain, PharmD, BCPS Pager [phone] 12/24/2015 10:20 AM

## 2015-12-24 NOTE — ED Notes (Signed)
XR called to verify NG placement.

## 2015-12-24 NOTE — ED Notes (Signed)
Bed: EA54WA05 Expected date:  Expected time:  Means of arrival:  Comments: EMS- 82yo F, abdominal pain/constipation x 4 days

## 2015-12-24 NOTE — H&P (Addendum)
History and Physical    Candace Cain ZOX:[id number] DOB: [birth year] DOA: 12/24/2015  PCP: Duane Lope, MD Patient coming from: Home  Chief Complaint: Abdominal pain  HPI: Candace Cain is a 82 y.o. female with medical history significant of atrial fibrillation, hypertension, hypothyroidism. She states that about 1 week ago she started having some loose and watery stools. He took some Imodium which helped with her loose stools because her to be constipated. At that time, she started having abdominal pain. She started taking a stool softener over-the-counter to help with her constipation which helped. Her abdominal pain continued to worsen over the next few days and she started developing nausea. Her pain was so severe last night that she fell she needs to be evaluated in the [facility] department. Her last bowel movement was 4 days ago and her last time passing gas was 3 days ago.   ED Course: Vitals: Afebrile, normal pulse. Normotensive. On room air. Labs: Creatinine of 1.20. WBC of 31.7. Lactic acid of 0.9. INR of 2.96 Imaging: CT abdomen significant for small bowel obstruction with mesenteric edema, ?volvulus and ?ischemia. Perihepatic ascites with diffuse mesenteric stranding.  Medications/Course: Zosyn, 1L NS bolus  Review of Systems: Review of Systems  Constitutional: Negative for chills and fever.  Respiratory: Negative for cough, sputum production, shortness of breath and wheezing.   Cardiovascular: Negative for chest pain and palpitations.  Gastrointestinal: Positive for abdominal pain, constipation, diarrhea and nausea. Negative for blood in stool, melena and vomiting.  Genitourinary: Negative for dysuria.  All other systems reviewed and are negative.   Past Medical History:  Diagnosis Date  . Alopecia   . Atrial fibrillation (HCC)   . Bradycardia   . Unspecified essential hypertension     Past Surgical History:  Procedure Laterality Date  . HIP ARTHROPLASTY Right 04/27/2014   Procedure: ARTHROPLASTY CEMENTED HEMI HIP;  Surgeon: Marcene Corning, MD;  Location: [facility];  Service: Orthopedics;  Laterality: Right;  . hysterectomy, unspecifed area    . neuropathy       reports that she has quit smoking. She has never used smokeless tobacco. She reports that she drinks alcohol. She reports that she does not use drugs.  No Known Allergies  Family History  Problem Relation Age of Onset  . Cancer      family history of it  . Coronary artery disease      family history of it  . Colon cancer Mother   . Heart disease Father   . Heart disease Sister   . Heart disease Brother   . Cancer Maternal Grandfather   . Prostate cancer Brother     Prior to Admission medications   Medication Sig Start Date End Date Taking? Authorizing Provider  amLODipine (NORVASC) 5 MG tablet Take 5 mg by mouth daily.    Yes Historical Provider, MD  b complex vitamins capsule Take 1 capsule by mouth daily.   Yes Historical Provider, MD  Calcium Carbonate-Vitamin D (TH CALCIUM CARBONATE-VITAMIN D) 600-400 MG-UNIT per tablet Take 2 tablets by mouth daily.     Yes Historical Provider, MD  levothyroxine (SYNTHROID, LEVOTHROID) 25 MCG tablet Take 25 mcg by mouth daily before breakfast.   Yes Historical Provider, MD  losartan (COZAAR) 100 MG tablet Take 100 mg by mouth daily.   Yes Historical Provider, MD  vitamin B-12 (CYANOCOBALAMIN) 1000 MCG tablet Take 1,000 mcg by mouth daily.   Yes Historical Provider, MD  warfarin (COUMADIN) 2.5 MG tablet Take 2.5 mg by mouth  every evening.   Yes Historical Provider, MD  warfarin (COUMADIN) 3 MG tablet Take 1 tablet (3 mg total) by mouth daily. Patient not taking: Reported on 12/24/2015 04/29/14   Jeralyn BennettEzequiel Zamora, MD    Physical Exam: Vitals:   12/24/15 0930 12/24/15 1030 12/24/15 1140 12/24/15 1300  BP: 147/93 137/65 145/82 128/82  Pulse: 78 65 82 (!) 57  Resp:   17 18  Temp:      TempSrc:      SpO2: 98% 99% 98% 95%  Weight:      Height:          Constitutional: NAD, calm, comfortable Eyes: PERRL, lids and conjunctivae normal ENMT: Mucous membranes are moist. Posterior pharynx clear of any exudate or lesions. Beautiful set of teeth with partials present Neck: normal, supple, no masses, no thyromegaly Respiratory: clear to auscultation bilaterally, no wheezing, no crackles. Normal respiratory effort. No accessory muscle use.  Cardiovascular: Normal rate and irregular rhythm, no murmurs / rubs / gallops. No extremity edema. 2+ pedal pulses.  Abdomen: moderate tenderness in right and left lower quadrants that is worse on right with guarding. No rebound. no masses palpated. Bowel sounds positive but decreased.  Musculoskeletal: no clubbing / cyanosis. No joint deformity upper and lower extremities. Good ROM, no contractures. Normal muscle tone.  Skin: no rashes, lesions, ulcers. No induration Neurologic: CN 2-12 grossly intact. Sensation intact, DTR normal. Strength 4/5 in all 4.  Psychiatric: Normal judgment and insight. Alert and oriented x 3. Normal mood.    Labs on Admission: I have personally reviewed following labs and imaging studies  CBC:  Recent Labs Lab 12/24/15 0850  WBC 31.7*  HGB 13.9  HCT 41.2  MCV 89.0  PLT 338   Basic Metabolic Panel:  Recent Labs Lab 12/24/15 0850  NA 135  K 4.0  CL 96*  CO2 24  GLUCOSE 109*  BUN 34*  CREATININE 1.20*  CALCIUM 9.3   GFR: Estimated Creatinine Clearance: 22.7 mL/min (by C-G formula based on SCr of 1.2 mg/dL (H)). Liver Function Tests:  Recent Labs Lab 12/24/15 0850  AST 24  ALT 16  ALKPHOS 106  BILITOT 0.9  PROT 7.2  ALBUMIN 3.7    Recent Labs Lab 12/24/15 0850  LIPASE 19   No results for input(s): AMMONIA in the last 168 hours. Coagulation Profile:  Recent Labs Lab 12/24/15 1326  INR 2.96   Cardiac Enzymes: No results for input(s): CKTOTAL, CKMB, CKMBINDEX, TROPONINI in the last 168 hours. BNP (last 3 results) No results for input(s):  PROBNP in the last 8760 hours. HbA1C: No results for input(s): HGBA1C in the last 72 hours. CBG: No results for input(s): GLUCAP in the last 168 hours. Lipid Profile: No results for input(s): CHOL, HDL, LDLCALC, TRIG, CHOLHDL, LDLDIRECT in the last 72 hours. Thyroid Function Tests: No results for input(s): TSH, T4TOTAL, FREET4, T3FREE, THYROIDAB in the last 72 hours. Anemia Panel: No results for input(s): VITAMINB12, FOLATE, FERRITIN, TIBC, IRON, RETICCTPCT in the last 72 hours. Urine analysis:    Component Value Date/Time   COLORURINE YELLOW 04/25/2014 2228   APPEARANCEUR TURBID (A) 04/25/2014 2228   LABSPEC 1.010 04/25/2014 2228   PHURINE 8.5 (H) 04/25/2014 2228   GLUCOSEU NEGATIVE 04/25/2014 2228   HGBUR MODERATE (A) 04/25/2014 2228   BILIRUBINUR NEGATIVE 04/25/2014 2228   KETONESUR NEGATIVE 04/25/2014 2228   PROTEINUR 30 (A) 04/25/2014 2228   UROBILINOGEN 0.2 04/25/2014 2228   NITRITE NEGATIVE 04/25/2014 2228   LEUKOCYTESUR SMALL (A) 04/25/2014  2228   Sepsis Labs: !!!!!!!!!!!!!!!!!!!!!!!!!!!!!!!!!!!!!!!!!!!! @LABRCNTIP (procalcitonin:4,lacticidven:4) )No results found for this or any previous visit (from the past 240 hour(s)).   Radiological Exams on Admission: Ct Abdomen Pelvis W Contrast  Result Date: 12/24/2015 CLINICAL DATA:  Abdominal pain, distension, and nausea. EXAM: CT ABDOMEN AND PELVIS WITH CONTRAST TECHNIQUE: Multidetector CT imaging of the abdomen and pelvis was performed using the standard protocol following bolus administration of intravenous contrast. CONTRAST:  60mL ISOVUE-300 IOPAMIDOL (ISOVUE-300) INJECTION 61% COMPARISON:  Abdominal ultrasound from 07/27/2005 and 11/27/2006. Abdominal radiograph 03/29/2014 FINDINGS: Lower chest: Cylindrical bronchiectasis in both lower lobes with associated airway thickening. Coronary atherosclerotic calcification. Hepatobiliary: 1.6 by 1.0 by 2.3 cm low-density lesion along the posterior capsular margin of the right hepatic  lobe, image 15/2. This is slightly higher in density than the perihepatic ascites. Dependent higher density in the gallbladder, likely from sludge. Pancreas: Unremarkable Spleen: Unremarkable Adrenals/Urinary Tract: Adrenal glands normal. Distal ureters obscured by streak artifact from the right hip implant. No hydronephrosis. There is a calcification in the vicinity of the right proximal ureter but thought to likely be outside of the ureter on image 71/5. This is probably a vascular calcification. There is some equivocal accentuated wall thickening/ wall enhancement of both renal collecting systems. Stomach/Bowel: There is swirling of the mesentery associated with small bowel obstruction, dilated bowel loops up to 4.7 cm, lead point for the small bowel obstruction in the region of swirling mesentery shown on images 44 through 59 of series 2 in the central abdomen at about the level of the iliac crests. The small bowel in the pelvis is indistinct due to a variety of factors. I am suspicious that there is acute sigmoid diverticulitis superimposed on the acute fell and fractured, and extra luminal fluid/ abscess along the sigmoid colon is not excluded. Some of this appearance may actually be due to edematous and irregular small bowel loops in this vicinity the but the sigmoid colon appears inflamed and with abnormal fluid collection along its margins suspicious for abscess. The distal most loops of small bowel are not distended. Vascular/Lymphatic: Aortoiliac atherosclerotic vascular disease. No adenopathy identified. Reproductive: Obscured by streak artifact from the right hip hemiarthroplasty. Other: Abnormal diffuse mesenteric stranding.  Perihepatic ascites. Musculoskeletal: Age-appropriate lumbar spondylosis and degenerative disc disease. IMPRESSION: 1. Acute small bowel obstruction with mesenteric edema and mesenteric vessels swirling around a lead point at transition of dilated to nondilated small bowel,  suspicious for volvulus. This is shown about at the level of the iliac crests in the central abdomen, for example images 44 through 59 series 2. Closed loop ischemia of the involved loop of pelvic small bowel extending down into the pelvis is not entirely excluded. Surgical consultation recommended. 2. There also appears to be acute sigmoid colon diverticulitis with possible abscesses along the margin of the sigmoid colon. This is immediately adjacent to the involved loop of small bowel which extends down into the pelvis which complicates the assessment of both processes. Moreover, there is streak artifact from a right hip hemiarthroplasty which obscures findings as well. 3.  Aortoiliac atherosclerotic vascular disease. 4. Perihepatic ascites and diffuse mesenteric stranding in the abdomen. 5. There is a low-density collection or lesion along the posterior margin of the liver, this may simply be a localized part of the ascites, and is less likely to be some sort of nodular peritoneal implant. These results will be called to the ordering clinician or representative by the Radiologist Assistant, and communication documented in the PACS or zVision Dashboard.  Electronically Signed   By: Gaylyn Rong M.D.   On: 12/24/2015 11:54    EKG: None obtained  Assessment/Plan Principal Problem:   Small bowel obstruction Active Problems:   Essential hypertension   Atrial fibrillation (HCC)   Hypothyroidism   Small bowel obstruction CT showed concern for volvulus. Discussed with surgery who does not feel presentation is consistent with volvulus. -surgery recommendations: NG tube with intermittent suction. ?surgery in AM. Reverse INR -NPO -D5 1/2 NS for MIVF -serial abdominal exams  Diverticulitis -continue Zosyn  Atrial fibrillation Stable. Patient on coumadin. INR of 2.96. Rate controlled. In atrial fibrillation per exam. -reverse coumadin with vitamin K 1mg  -heparin if subtherapeutic in AM and no  surgery -EKG  Hypothyroidism -synthroid 12. IV daily  Hypertension -hold antihypertensives while NPO -hydralazine prn   DVT prophylaxis: SCDs Code Status: DNR Family Communication: None at bedside Disposition Plan: Anticipate discharge home in several days Consults called: General surgery Admission status: Inpatient, medical floor   Jacquelin Hawking, MD [facility] Pager [phone]  If 7PM-7AM, please contact night-coverage [email or web address] Password [email or web address]  12/24/2015, 2:18 PM

## 2015-12-24 NOTE — ED Notes (Signed)
Patient transported to CT 

## 2015-12-24 NOTE — ED Triage Notes (Signed)
Per EMS- patient c/o abdominal pain, distention, and nausea  today. Patient states no BM in 4 days.

## 2015-12-24 NOTE — ED Notes (Signed)
General surgery at bedside. 

## 2015-12-24 NOTE — ED Notes (Signed)
[facility] CT showed "acute small bowel obstruction and diverticulitis." MD made aware.

## 2015-12-24 NOTE — ED Notes (Signed)
I called main labs for second set of cultures.  Patient upset because she had to be stuck several times

## 2015-12-24 NOTE — ED Provider Notes (Signed)
[facility]-EMERGENCY DEPT Provider Note   CSN: [id number] Arrival date & time: 12/24/15  0804     History   Chief Complaint Chief Complaint  Patient presents with  . Abdominal Pain  . Constipation    HPI Candace Cain is a 82 y.o. female.  HPI   Severe abdominal pain, started 1 week ago, has been getting worse Diffuse abdominal pain, hurts more on the right side Cramping pain At first had diarrhea, took immodium which made it stop. Then had trouble having a BM, took stool softener, which made soft/diarrhe stool, then again stopped having BM, now 4-5 days ago since last BM No flatus Last night couldn't sleep at all because of pain No vomiting but felt nausea No fevers  Past Medical History:  Diagnosis Date  . Alopecia   . Atrial fibrillation (HCC)   . Bradycardia   . Unspecified essential hypertension     Patient Active Problem List   Diagnosis Date Noted  . Small bowel obstruction 12/24/2015  . Diverticulitis 12/24/2015  . Closed right hip fracture (HCC) 04/26/2014  . Hip fracture requiring operative repair (HCC) 04/25/2014  . Lobar pneumonia due to unspecified organism 03/28/2014  . Dehydration, moderate 03/28/2014  . Hypothyroidism 03/28/2014  . GERD (gastroesophageal reflux disease) 03/28/2014  . Depression 03/28/2014  . Community acquired pneumonia 03/28/2014  . Hemoptysis   . Hyponatremia 03/27/2014  . Long term (current) use of anticoagulants 04/21/2011  . CHEST PAIN 04/22/2008  . Essential hypertension 04/19/2008  . Atrial fibrillation (HCC) 04/19/2008  . BRADYCARDIA 04/19/2008    Past Surgical History:  Procedure Laterality Date  . HIP ARTHROPLASTY Right 04/27/2014   Procedure: ARTHROPLASTY CEMENTED HEMI HIP;  Surgeon: Marcene CorningPeter Dalldorf, MD;  Location: [facility];  Service: Orthopedics;  Laterality: Right;  . hysterectomy, unspecifed area    . neuropathy      OB History    No data available       Home Medications    Prior to Admission medications     Medication Sig Start Date End Date Taking? Authorizing Provider  amLODipine (NORVASC) 5 MG tablet Take 5 mg by mouth daily.    Yes Historical Provider, MD  b complex vitamins capsule Take 1 capsule by mouth daily.   Yes Historical Provider, MD  Calcium Carbonate-Vitamin D (TH CALCIUM CARBONATE-VITAMIN D) 600-400 MG-UNIT per tablet Take 2 tablets by mouth daily.     Yes Historical Provider, MD  levothyroxine (SYNTHROID, LEVOTHROID) 25 MCG tablet Take 25 mcg by mouth daily before breakfast.   Yes Historical Provider, MD  losartan (COZAAR) 100 MG tablet Take 100 mg by mouth daily.   Yes Historical Provider, MD  vitamin B-12 (CYANOCOBALAMIN) 1000 MCG tablet Take 1,000 mcg by mouth daily.   Yes Historical Provider, MD  warfarin (COUMADIN) 2.5 MG tablet Take 2.5 mg by mouth every evening.   Yes Historical Provider, MD  warfarin (COUMADIN) 3 MG tablet Take 1 tablet (3 mg total) by mouth daily. Patient not taking: Reported on 12/24/2015 04/29/14   Jeralyn BennettEzequiel Zamora, MD    Family History Family History  Problem Relation Age of Onset  . Cancer      family history of it  . Coronary artery disease      family history of it  . Colon cancer Mother   . Heart disease Father   . Heart disease Sister   . Heart disease Brother   . Cancer Maternal Grandfather   . Prostate cancer Brother     Social History  Social History  Substance Use Topics  . Smoking status: Former Games developer  . Smokeless tobacco: Never Used  . Alcohol use Yes     Comment: occasional wine      Allergies   Patient has no known allergies.   Review of Systems Review of Systems  Constitutional: Negative for fever.  HENT: Negative for congestion and sore throat.   Eyes: Negative for visual disturbance.  Respiratory: Negative for cough (chronic dry cough at night).   Cardiovascular: Negative for chest pain.  Gastrointestinal: Positive for abdominal pain, constipation, diarrhea and nausea. Negative for vomiting.  Genitourinary:  Negative for difficulty urinating and dysuria. Dyspareunia: sometimes hard to come out.  Skin: Negative for rash.     Physical Exam Updated Vital Signs BP (!) 142/83 (BP Location: Left Arm)   Pulse 90   Temp 98.6 F (37 C) (Oral)   Resp 18   Ht 5' 1.5" (1.562 m)   Wt 127 lb (57.6 kg)   SpO2 97%   BMI 23.61 kg/m   Physical Exam  Constitutional: She is oriented to person, place, and time. She appears well-developed and well-nourished. No distress.  HENT:  Head: Normocephalic and atraumatic.  Eyes: Conjunctivae and EOM are normal.  Neck: Normal range of motion.  Cardiovascular: Normal rate, regular rhythm, normal heart sounds and intact distal pulses.  Exam reveals no gallop and no friction rub.   No murmur heard. Pulmonary/Chest: Effort normal and breath sounds normal. No respiratory distress. She has no wheezes. She has no rales.  Abdominal: Soft. She exhibits distension. There is tenderness (diffuse, worse on right side, RLQ, RUQ, suprapubic). There is guarding.  Musculoskeletal: She exhibits no edema or tenderness.  Neurological: She is alert and oriented to person, place, and time.  Skin: Skin is warm and dry. No rash noted. She is not diaphoretic. No erythema.  Nursing note and vitals reviewed.    ED Treatments / Results  Labs (all labs ordered are listed, but only abnormal results are displayed) Labs Reviewed  COMPREHENSIVE METABOLIC PANEL - Abnormal; Notable for the following:       Result Value   Chloride 96 (*)    Glucose, Bld 109 (*)    BUN 34 (*)    Creatinine, Ser 1.20 (*)    GFR calc non Af Amer 38 (*)    GFR calc Af Amer 44 (*)    All other components within normal limits  CBC - Abnormal; Notable for the following:    WBC 31.7 (*)    All other components within normal limits  URINALYSIS, ROUTINE W REFLEX MICROSCOPIC - Abnormal; Notable for the following:    Color, Urine AMBER (*)    APPearance CLOUDY (*)    Specific Gravity, Urine 1.040 (*)    Hgb  urine dipstick SMALL (*)    Ketones, ur 20 (*)    Protein, ur 30 (*)    Leukocytes, UA MODERATE (*)    Bacteria, UA FEW (*)    Squamous Epithelial / LPF 0-5 (*)    Non Squamous Epithelial 0-5 (*)    All other components within normal limits  PROTIME-INR - Abnormal; Notable for the following:    Prothrombin Time 31.4 (*)    All other components within normal limits  CULTURE, BLOOD (ROUTINE X 2)  CULTURE, BLOOD (ROUTINE X 2)  URINE CULTURE  LIPASE, BLOOD  BASIC METABOLIC PANEL  CBC  PROTIME-INR  I-STAT CG4 LACTIC ACID, ED  I-STAT CG4 LACTIC ACID, ED  EKG  EKG Interpretation None       Radiology Ct Abdomen Pelvis W Contrast  Result Date: 12/24/2015 CLINICAL DATA:  Abdominal pain, distension, and nausea. EXAM: CT ABDOMEN AND PELVIS WITH CONTRAST TECHNIQUE: Multidetector CT imaging of the abdomen and pelvis was performed using the standard protocol following bolus administration of intravenous contrast. CONTRAST:  60mL ISOVUE-300 IOPAMIDOL (ISOVUE-300) INJECTION 61% COMPARISON:  Abdominal ultrasound from 07/27/2005 and 11/27/2006. Abdominal radiograph 03/29/2014 FINDINGS: Lower chest: Cylindrical bronchiectasis in both lower lobes with associated airway thickening. Coronary atherosclerotic calcification. Hepatobiliary: 1.6 by 1.0 by 2.3 cm low-density lesion along the posterior capsular margin of the right hepatic lobe, image 15/2. This is slightly higher in density than the perihepatic ascites. Dependent higher density in the gallbladder, likely from sludge. Pancreas: Unremarkable Spleen: Unremarkable Adrenals/Urinary Tract: Adrenal glands normal. Distal ureters obscured by streak artifact from the right hip implant. No hydronephrosis. There is a calcification in the vicinity of the right proximal ureter but thought to likely be outside of the ureter on image 71/5. This is probably a vascular calcification. There is some equivocal accentuated wall thickening/ wall enhancement of both  renal collecting systems. Stomach/Bowel: There is swirling of the mesentery associated with small bowel obstruction, dilated bowel loops up to 4.7 cm, lead point for the small bowel obstruction in the region of swirling mesentery shown on images 44 through 59 of series 2 in the central abdomen at about the level of the iliac crests. The small bowel in the pelvis is indistinct due to a variety of factors. I am suspicious that there is acute sigmoid diverticulitis superimposed on the acute fell and fractured, and extra luminal fluid/ abscess along the sigmoid colon is not excluded. Some of this appearance may actually be due to edematous and irregular small bowel loops in this vicinity the but the sigmoid colon appears inflamed and with abnormal fluid collection along its margins suspicious for abscess. The distal most loops of small bowel are not distended. Vascular/Lymphatic: Aortoiliac atherosclerotic vascular disease. No adenopathy identified. Reproductive: Obscured by streak artifact from the right hip hemiarthroplasty. Other: Abnormal diffuse mesenteric stranding.  Perihepatic ascites. Musculoskeletal: Age-appropriate lumbar spondylosis and degenerative disc disease. IMPRESSION: 1. Acute small bowel obstruction with mesenteric edema and mesenteric vessels swirling around a lead point at transition of dilated to nondilated small bowel, suspicious for volvulus. This is shown about at the level of the iliac crests in the central abdomen, for example images 44 through 59 series 2. Closed loop ischemia of the involved loop of pelvic small bowel extending down into the pelvis is not entirely excluded. Surgical consultation recommended. 2. There also appears to be acute sigmoid colon diverticulitis with possible abscesses along the margin of the sigmoid colon. This is immediately adjacent to the involved loop of small bowel which extends down into the pelvis which complicates the assessment of both processes. Moreover,  there is streak artifact from a right hip hemiarthroplasty which obscures findings as well. 3.  Aortoiliac atherosclerotic vascular disease. 4. Perihepatic ascites and diffuse mesenteric stranding in the abdomen. 5. There is a low-density collection or lesion along the posterior margin of the liver, this may simply be a localized part of the ascites, and is less likely to be some sort of nodular peritoneal implant. These results will be called to the ordering clinician or representative by the Radiologist Assistant, and communication documented in the PACS or zVision Dashboard. Electronically Signed   By: Gaylyn Rong M.D.   On: 12/24/2015 11:54  Dg Abd Portable 1v-small Bowel Protocol-position Verification  Result Date: 12/24/2015 CLINICAL DATA:  NG tube placement EXAM: PORTABLE ABDOMEN - 1 VIEW COMPARISON:  CT scan same day FINDINGS: Gaseous distended small bowel loops in the abdomen suspicious for bowel obstruction. NG tube with tip in mid stomach. IMPRESSION: NG tube with tip in mid stomach. Electronically Signed   By: Natasha MeadLiviu  Pop M.D.   On: 12/24/2015 15:19    Procedures Procedures (including critical care time)  Medications Ordered in ED Medications  iopamidol (ISOVUE-300) 61 % injection (not administered)  sodium chloride 0.9 % injection (not administered)  piperacillin-tazobactam (ZOSYN) IVPB 3.375 g (not administered)  sodium chloride 0.9 % injection (not administered)  ondansetron (ZOFRAN) injection 4 mg (not administered)  levothyroxine (SYNTHROID, LEVOTHROID) injection 12.5 mcg (not administered)  dextrose 5 %-0.45 % sodium chloride infusion ( Intravenous New Bag/Given 12/24/15 1754)  morphine 4 MG/ML injection 4 mg (not administered)  hydrALAZINE (APRESOLINE) injection 5 mg (not administered)  phenol (CHLORASEPTIC) mouth spray 1 spray (not administered)  ondansetron (ZOFRAN) injection 4 mg (4 mg Intravenous Given 12/24/15 0858)  sodium chloride 0.9 % bolus 1,000 mL (0 mLs  Intravenous Stopped 12/24/15 1102)  morphine 4 MG/ML injection 4 mg (4 mg Intravenous Given 12/24/15 0958)  ondansetron (ZOFRAN) injection 4 mg (4 mg Intravenous Given 12/24/15 0958)  piperacillin-tazobactam (ZOSYN) IVPB 3.375 g (0 g Intravenous Stopped 12/24/15 1101)  iopamidol (ISOVUE-300) 61 % injection 100 mL (60 mLs Intravenous Contrast Given 12/24/15 1122)  LORazepam (ATIVAN) injection 0.5 mg (0.5 mg Intravenous Given 12/24/15 2216)     Initial Impression / Assessment and Plan / ED Course  I have reviewed the triage vital signs and the nursing notes.  Pertinent labs & imaging results that were available during my care of the patient were reviewed by me and considered in my medical decision making (see chart for details).  Clinical Course    82yo female with hx of htn, atrial fibrillation presents with concern for 1wk of worsening abdominal pain, nausea, no BM for 4 days and no flatus.  Pt with WBC over 30000, given severe tenderness and concern for intraabdominal infection, given IV fluids, zosyn after blood cx drawn.  CT abdomen pelvis obtained showing SBO, possible volvulus, diverticulitis with abscess. Consulted General Surgery and Hospitalist. Given morphine and zofran.  Final Clinical Impressions(s) / ED Diagnoses   Final diagnoses:  Small bowel obstruction  Encounter for imaging study to confirm nasogastric (NG) tube placement    New Prescriptions Current Discharge Medication List       Alvira MondayErin Ismerai Bin, MD 12/25/15 [phone]

## 2015-12-24 NOTE — ED Notes (Signed)
Main lab in with patient.

## 2015-12-24 NOTE — ED Notes (Signed)
Patient again requested main lab to draw blood. ED Phlebotomist called.

## 2015-12-24 NOTE — Consult Note (Signed)
[facility] Consult Note  Candace Cain 10/20/1920  [id number].    Requesting MD: Billy Fischer Chief Complaint/Reason for Consult: SBO  HPI:  Candace Cain is a 82yo female who presented to [facility] earlier today with 7-10 days of gradually worsening abdominal pain. At first she had diarrhea which improved with immodium, then she thought she was constipated and took a stool softener. Pain became severe today therefore she decided to come to the ED. Pain is global but mostly in the lower abdomen R>L. It is worse with eating, deep breathing, coughing, urination, and ambulation. Associated symptoms include nausea, anorexia, abdominal fullness. Denies fever, chills, dysuria.  Last BM 4-5 days ago.  PMH significant for Atrial fibrillation (on coumadin), HTN, hypothyroidism Abdominal surgical history includes hysterectomy, tubal ligation Last colonoscopy 10 years ago, patient reports normal findings Anticoagulants: coumadin (last dose about 2-3 days ago) Nonsmoker Lives alone in retirement community, uses cane PRN for ambulation  ROS: Review of Systems  Constitutional: Negative.   Respiratory: Negative.   Cardiovascular: Negative.   Gastrointestinal: Positive for abdominal pain, constipation and nausea. Negative for vomiting.  Genitourinary: Negative.   Skin: Negative.      Family History  Problem Relation Age of Onset  . Cancer      family history of it  . Coronary artery disease      family history of it  . Colon cancer Mother   . Heart disease Father   . Heart disease Sister   . Heart disease Brother   . Cancer Maternal Grandfather   . Prostate cancer Brother     Past Medical History:  Diagnosis Date  . Alopecia   . Atrial fibrillation ([place])   . Bradycardia   . Unspecified essential hypertension     Past Surgical History:  Procedure Laterality Date  . HIP ARTHROPLASTY Right 04/27/2014   Procedure: ARTHROPLASTY CEMENTED HEMI HIP;  Surgeon: Melrose Nakayama, MD;   Location: [facility];  Service: Orthopedics;  Laterality: Right;  . hysterectomy, unspecifed area    . neuropathy      Social History:  reports that she has quit smoking. She has never used smokeless tobacco. She reports that she drinks alcohol. She reports that she does not use drugs.  Allergies: No Known Allergies   (Not in a hospital admission)  Blood pressure 145/82, pulse 82, temperature 97.8 F (36.6 C), temperature source Oral, resp. rate 17, height 5' 1.5" (1.562 m), weight 127 lb (57.6 kg), SpO2 98 %. Physical Exam: General: pleasant, WD/WN white female who is laying in bed in NAD HEENT: head is normocephalic, atraumatic.  Sclera are noninjected.  Mouth is pink and moist Heart: irregular rhythm, regular rate.  No obvious murmurs, gallops, or rubs noted.  Palpable pedal pulses bilaterally Lungs: CTAB, no wheezes, rhonchi, or rales noted.  Respiratory effort nonlabored Abd: well healed vertical incision distal to umbilicus, soft, tympanic and distended, TTP lower abdominal quadrants R>L, hypoactive BS, no masses, hernias, or organomegaly MS: all 4 extremities are symmetrical with no cyanosis, clubbing, or edema. Skin: warm and dry with no masses, lesions, or rashes Psych: A&Ox3 with an appropriate affect. Neuro: CM 2-12 intact, extremity CSM intact bilaterally, normal speech  Results for orders placed or performed during the hospital encounter of 12/24/15 (from the past 48 hour(s))  Lipase, blood     Status: None   Collection Time: 12/24/15  8:50 AM  Result Value Ref Range   Lipase 19 11 - 51 U/L  Comprehensive metabolic panel  Status: Abnormal   Collection Time: 12/24/15  8:50 AM  Result Value Ref Range   Sodium 135 135 - 145 mmol/L   Potassium 4.0 3.5 - 5.1 mmol/L   Chloride 96 (L) 101 - 111 mmol/L   CO2 24 22 - 32 mmol/L   Glucose, Bld 109 (H) 65 - 99 mg/dL   BUN 34 (H) 6 - 20 mg/dL   Creatinine, Ser 1.20 (H) 0.44 - 1.00 mg/dL   Calcium 9.3 8.9 - 10.3 mg/dL   Total  Protein 7.2 6.5 - 8.1 g/dL   Albumin 3.7 3.5 - 5.0 g/dL   AST 24 15 - 41 U/L   ALT 16 14 - 54 U/L   Alkaline Phosphatase 106 38 - 126 U/L   Total Bilirubin 0.9 0.3 - 1.2 mg/dL   GFR calc non Af Amer 38 (L) >60 mL/min   GFR calc Af Amer 44 (L) >60 mL/min    Comment: (NOTE) The eGFR has been calculated using the CKD EPI equation. This calculation has not been validated in all clinical situations. eGFR's persistently <60 mL/min signify possible Chronic Kidney Disease.    Anion gap 15 5 - 15  CBC     Status: Abnormal   Collection Time: 12/24/15  8:50 AM  Result Value Ref Range   WBC 31.7 (H) 4.0 - 10.5 K/uL   RBC 4.63 3.87 - 5.11 MIL/uL   Hemoglobin 13.9 12.0 - 15.0 g/dL   HCT 41.2 36.0 - 46.0 %   MCV 89.0 78.0 - 100.0 fL   MCH 30.0 26.0 - 34.0 pg   MCHC 33.7 30.0 - 36.0 g/dL   RDW 14.3 11.5 - 15.5 %   Platelets 338 150 - 400 K/uL  I-Stat CG4 Lactic Acid, ED     Status: None   Collection Time: 12/24/15 10:22 AM  Result Value Ref Range   Lactic Acid, Venous 0.90 0.5 - 1.9 mmol/L   Ct Abdomen Pelvis W Contrast  Result Date: 12/24/2015 CLINICAL DATA:  Abdominal pain, distension, and nausea. EXAM: CT ABDOMEN AND PELVIS WITH CONTRAST TECHNIQUE: Multidetector CT imaging of the abdomen and pelvis was performed using the standard protocol following bolus administration of intravenous contrast. CONTRAST:  4m ISOVUE-300 IOPAMIDOL (ISOVUE-300) INJECTION 61% COMPARISON:  Abdominal ultrasound from 07/27/2005 and 11/27/2006. Abdominal radiograph 03/29/2014 FINDINGS: Lower chest: Cylindrical bronchiectasis in both lower lobes with associated airway thickening. Coronary atherosclerotic calcification. Hepatobiliary: 1.6 by 1.0 by 2.3 cm low-density lesion along the posterior capsular margin of the right hepatic lobe, image 15/2. This is slightly higher in density than the perihepatic ascites. Dependent higher density in the gallbladder, likely from sludge. Pancreas: Unremarkable Spleen: Unremarkable  Adrenals/Urinary Tract: Adrenal glands normal. Distal ureters obscured by streak artifact from the right hip implant. No hydronephrosis. There is a calcification in the vicinity of the right proximal ureter but thought to likely be outside of the ureter on image 71/5. This is probably a vascular calcification. There is some equivocal accentuated wall thickening/ wall enhancement of both renal collecting systems. Stomach/Bowel: There is swirling of the mesentery associated with small bowel obstruction, dilated bowel loops up to 4.7 cm, lead point for the small bowel obstruction in the region of swirling mesentery shown on images 44 through 59 of series 2 in the central abdomen at about the level of the iliac crests. The small bowel in the pelvis is indistinct due to a variety of factors. I am suspicious that there is acute sigmoid diverticulitis superimposed on the acute fell and  fractured, and extra luminal fluid/ abscess along the sigmoid colon is not excluded. Some of this appearance may actually be due to edematous and irregular small bowel loops in this vicinity the but the sigmoid colon appears inflamed and with abnormal fluid collection along its margins suspicious for abscess. The distal most loops of small bowel are not distended. Vascular/Lymphatic: Aortoiliac atherosclerotic vascular disease. No adenopathy identified. Reproductive: Obscured by streak artifact from the right hip hemiarthroplasty. Other: Abnormal diffuse mesenteric stranding.  Perihepatic ascites. Musculoskeletal: Age-appropriate lumbar spondylosis and degenerative disc disease. IMPRESSION: 1. Acute small bowel obstruction with mesenteric edema and mesenteric vessels swirling around a lead point at transition of dilated to nondilated small bowel, suspicious for volvulus. This is shown about at the level of the iliac crests in the central abdomen, for example images 44 through 59 series 2. Closed loop ischemia of the involved loop of pelvic  small bowel extending down into the pelvis is not entirely excluded. Surgical consultation recommended. 2. There also appears to be acute sigmoid colon diverticulitis with possible abscesses along the margin of the sigmoid colon. This is immediately adjacent to the involved loop of small bowel which extends down into the pelvis which complicates the assessment of both processes. Moreover, there is streak artifact from a right hip hemiarthroplasty which obscures findings as well. 3.  Aortoiliac atherosclerotic vascular disease. 4. Perihepatic ascites and diffuse mesenteric stranding in the abdomen. 5. There is a low-density collection or lesion along the posterior margin of the liver, this may simply be a localized part of the ascites, and is less likely to be some sort of nodular peritoneal implant. These results will be called to the ordering clinician or representative by the Radiologist Assistant, and communication documented in the PACS or zVision Dashboard. Electronically Signed   By: Van Clines M.D.   On: 12/24/2015 11:54      Assessment/Plan Abdominal pain, nausea x7-10 days SBO, possible volvulus Sigmoid diverticulitis with possible abscesses - CT scan shows acute small bowel obstruction with mesenteric edema suspicious for volvulus; Closed loop ischemia of the involved small bowel is not entirely excluded; acute sigmoid colon diverticulitis with possible abscesses  - LA WNL and vitals stable  AKI - Cr 1.2 Leukocytosis - WBC 31.7 Atrial fibrillation - hold coumadin, INR 2.96 HTN - on losartan and norvasc at home  Plan - recommend medicine admit for multiple medical issues, NPO/NGT, bowel rest, IVF, pain control, antiemetics, antibiotics (zosyn). Please hold coumadin and we will likely reverse this in case she needs an operation (discussed with Nettey MD who recommends 74m vitamin K); this will be given pending Dr. RBertrum Solexamination of the patient. Will review CT scan with MD.  2 view abdominal XR in AM.  BJerrye Beavers [facility] 12/24/2015, 12:19 PM Pager: [phone] Consults: [phone]-Fri 7:00 am-4:30 pm Sat-Sun 7:00 am-11:30 am

## 2015-12-25 ENCOUNTER — Inpatient Hospital Stay (HOSPITAL_COMMUNITY): Payer: Medicare Other | Admitting: Certified Registered Nurse Anesthetist

## 2015-12-25 ENCOUNTER — Encounter (HOSPITAL_COMMUNITY): Payer: Self-pay | Admitting: Certified Registered"

## 2015-12-25 ENCOUNTER — Inpatient Hospital Stay (HOSPITAL_COMMUNITY): Payer: Medicare Other

## 2015-12-25 ENCOUNTER — Encounter (HOSPITAL_COMMUNITY)
Admission: EM | Disposition: A | Discharge status: Skilled Nursing Facility | Payer: Self-pay | Source: Home / Self Care | Attending: Internal Medicine

## 2015-12-25 DIAGNOSIS — K631 Perforation of intestine (nontraumatic): Secondary | ICD-10-CM

## 2015-12-25 DIAGNOSIS — E43 Unspecified severe protein-calorie malnutrition: Secondary | ICD-10-CM

## 2015-12-25 DIAGNOSIS — K56609 Unspecified intestinal obstruction, unspecified as to partial versus complete obstruction: Secondary | ICD-10-CM

## 2015-12-25 HISTORY — DX: Perforation of intestine (nontraumatic): K63.1

## 2015-12-25 HISTORY — DX: Unspecified severe protein-calorie malnutrition: E43

## 2015-12-25 HISTORY — PX: LAPAROTOMY: SHX154

## 2015-12-25 LAB — TYPE AND SCREEN
Blood Product Expiration Date: 201801012359
UNIT TYPE AND RH: 6200

## 2015-12-25 LAB — CBC
HCT: 36.4 % (ref 36.0–46.0)
HEMOGLOBIN: 11.9 g/dL — AB (ref 12.0–15.0)
MCH: 29 pg (ref 26.0–34.0)
MCHC: 32.7 g/dL (ref 30.0–36.0)
MCV: 88.6 fL (ref 78.0–100.0)
PLATELETS: 383 10*3/uL (ref 150–400)
RBC: 4.11 MIL/uL (ref 3.87–5.11)
RDW: 14.1 % (ref 11.5–15.5)
WBC: 21.6 10*3/uL — ABNORMAL HIGH (ref 4.0–10.5)

## 2015-12-25 LAB — BASIC METABOLIC PANEL
ANION GAP: 9 (ref 5–15)
BUN: 27 mg/dL — ABNORMAL HIGH (ref 6–20)
CALCIUM: 8.5 mg/dL — AB (ref 8.9–10.3)
CO2: 24 mmol/L (ref 22–32)
CREATININE: 0.88 mg/dL (ref 0.44–1.00)
Chloride: 101 mmol/L (ref 101–111)
GFR calc Af Amer: >60 mL/min (ref >60)
GFR, EST NON AFRICAN AMERICAN: 55 mL/min — AB (ref >60)
GLUCOSE: 100 mg/dL — AB (ref 65–99)
Potassium: 3.4 mmol/L — ABNORMAL LOW (ref 3.5–5.1)
Sodium: 134 mmol/L — ABNORMAL LOW (ref 135–145)

## 2015-12-25 LAB — PROTIME-INR
INR: 3.67
INR: 1.2
PROTHROMBIN TIME: 37.4 s — AB (ref 11.4–15.2)
Prothrombin Time: 15.3 seconds — ABNORMAL HIGH (ref 11.4–15.2)

## 2015-12-25 SURGERY — LAPAROTOMY, EXPLORATORY
Anesthesia: General

## 2015-12-25 MED ORDER — SODIUM CHLORIDE 0.9 % IV SOLN
INTRAVENOUS | Status: DC | PRN
  Administered 2015-12-25: 20:00:00 via INTRAVENOUS

## 2015-12-25 MED ORDER — FENTANYL CITRATE (PF) 100 MCG/2ML IJ SOLN
INTRAMUSCULAR | Status: AC
  Filled 2015-12-25: qty 2

## 2015-12-25 MED ORDER — LACTATED RINGERS IV SOLN
INTRAVENOUS | Status: DC | PRN
  Administered 2015-12-25: 20:00:00 via INTRAVENOUS

## 2015-12-25 MED ORDER — ARTIFICIAL TEARS OP OINT
TOPICAL_OINTMENT | OPHTHALMIC | Status: AC
  Filled 2015-12-25: qty 3.5

## 2015-12-25 MED ORDER — LACTATED RINGERS IV SOLN: INTRAVENOUS | Status: DC

## 2015-12-25 MED ORDER — METHOCARBAMOL 1000 MG/10ML IJ SOLN
500.0000 mg | Freq: Three times a day (TID) | INTRAMUSCULAR | Status: DC | PRN
  Filled 2015-12-25: qty 5

## 2015-12-25 MED ORDER — PROPOFOL 10 MG/ML IV BOLUS
INTRAVENOUS | Status: DC | PRN
  Administered 2015-12-25: 80 mg via INTRAVENOUS

## 2015-12-25 MED ORDER — SODIUM CHLORIDE 0.9 % IV SOLN: Freq: Once | INTRAVENOUS | Status: DC

## 2015-12-25 MED ORDER — LIDOCAINE 2% (20 MG/ML) 5 ML SYRINGE
INTRAMUSCULAR | Status: DC | PRN
  Administered 2015-12-25: 20 mg via INTRAVENOUS

## 2015-12-25 MED ORDER — SODIUM CHLORIDE 0.9 % IV SOLN: 250.0000 mL | INTRAVENOUS | Status: DC | PRN

## 2015-12-25 MED ORDER — FENTANYL CITRATE (PF) 100 MCG/2ML IJ SOLN: 25.0000 ug | INTRAMUSCULAR | Status: DC | PRN

## 2015-12-25 MED ORDER — VITAMIN K1 10 MG/ML IJ SOLN
2.0000 mg | Freq: Once | INTRAVENOUS | Status: AC
  Administered 2015-12-25: 2 mg via INTRAVENOUS
  Filled 2015-12-25: qty 0.2

## 2015-12-25 MED ORDER — SUGAMMADEX SODIUM 200 MG/2ML IV SOLN
INTRAVENOUS | Status: DC | PRN
  Administered 2015-12-25: 200 mg via INTRAVENOUS

## 2015-12-25 MED ORDER — SODIUM CHLORIDE 0.9 % IV SOLN
INTRAVENOUS | Status: DC
Start: 2015-12-25 — End: 2015-12-25

## 2015-12-25 MED ORDER — SUCCINYLCHOLINE CHLORIDE 200 MG/10ML IV SOSY
PREFILLED_SYRINGE | INTRAVENOUS | Status: AC
  Filled 2015-12-25: qty 10

## 2015-12-25 MED ORDER — ONDANSETRON HCL 4 MG/2ML IJ SOLN
INTRAMUSCULAR | Status: AC
  Filled 2015-12-25: qty 2

## 2015-12-25 MED ORDER — HYDROMORPHONE HCL 2 MG/ML IJ SOLN
1.0000 mg | INTRAMUSCULAR | Status: DC | PRN
  Administered 2015-12-25 – 2015-12-30 (×9): 1 mg via INTRAVENOUS
  Filled 2015-12-25 (×9): qty 1

## 2015-12-25 MED ORDER — KCL IN DEXTROSE-NACL 20-5-0.9 MEQ/L-%-% IV SOLN
INTRAVENOUS | Status: DC
  Filled 2015-12-25: qty 1000

## 2015-12-25 MED ORDER — ALBUMIN HUMAN 5 % IV SOLN
INTRAVENOUS | Status: DC | PRN
  Administered 2015-12-25: 20:00:00 via INTRAVENOUS

## 2015-12-25 MED ORDER — FENTANYL CITRATE (PF) 100 MCG/2ML IJ SOLN
INTRAMUSCULAR | Status: DC | PRN
  Administered 2015-12-25 (×3): 50 ug via INTRAVENOUS

## 2015-12-25 MED ORDER — PHENYLEPHRINE 40 MCG/ML (10ML) SYRINGE FOR IV PUSH (FOR BLOOD PRESSURE SUPPORT)
PREFILLED_SYRINGE | INTRAVENOUS | Status: DC | PRN
  Administered 2015-12-25 (×2): 80 ug via INTRAVENOUS

## 2015-12-25 MED ORDER — SUCCINYLCHOLINE CHLORIDE 200 MG/10ML IV SOSY
PREFILLED_SYRINGE | INTRAVENOUS | Status: DC | PRN
  Administered 2015-12-25: 100 mg via INTRAVENOUS

## 2015-12-25 MED ORDER — ONDANSETRON HCL 4 MG/2ML IJ SOLN
INTRAMUSCULAR | Status: DC | PRN
  Administered 2015-12-25: 4 mg via INTRAVENOUS

## 2015-12-25 MED ORDER — HEPARIN SODIUM (PORCINE) 5000 UNIT/ML IJ SOLN
5000.0000 [IU] | Freq: Three times a day (TID) | INTRAMUSCULAR | Status: DC
  Administered 2015-12-26 – 2015-12-28 (×7): 5000 [IU] via SUBCUTANEOUS
  Filled 2015-12-25 (×10): qty 1

## 2015-12-25 MED ORDER — PROPOFOL 10 MG/ML IV BOLUS
INTRAVENOUS | Status: AC
  Filled 2015-12-25: qty 20

## 2015-12-25 MED ORDER — MAGNESIUM SULFATE 2 GM/50ML IV SOLN
2.0000 g | Freq: Once | INTRAVENOUS | Status: DC
  Filled 2015-12-25: qty 50

## 2015-12-25 MED ORDER — MORPHINE SULFATE (PF) 4 MG/ML IV SOLN
4.0000 mg | INTRAVENOUS | Status: DC | PRN
Start: 2015-12-25 — End: 2016-01-03
  Administered 2015-12-25 – 2016-01-01 (×17): 4 mg via INTRAVENOUS
  Filled 2015-12-25 (×17): qty 1

## 2015-12-25 MED ORDER — LIDOCAINE 2% (20 MG/ML) 5 ML SYRINGE
INTRAMUSCULAR | Status: AC
  Filled 2015-12-25: qty 5

## 2015-12-25 MED ORDER — SUGAMMADEX SODIUM 200 MG/2ML IV SOLN
INTRAVENOUS | Status: AC
  Filled 2015-12-25: qty 2

## 2015-12-25 MED ORDER — 0.9 % SODIUM CHLORIDE (POUR BTL) OPTIME
TOPICAL | Status: DC | PRN
  Administered 2015-12-25: 5000 mL

## 2015-12-25 MED ORDER — ROCURONIUM BROMIDE 50 MG/5ML IV SOSY
PREFILLED_SYRINGE | INTRAVENOUS | Status: AC
  Filled 2015-12-25: qty 5

## 2015-12-25 MED ORDER — ONDANSETRON HCL 4 MG/2ML IJ SOLN: 4.0000 mg | Freq: Once | INTRAMUSCULAR | Status: DC | PRN

## 2015-12-25 MED ORDER — ROCURONIUM BROMIDE 10 MG/ML (PF) SYRINGE
PREFILLED_SYRINGE | INTRAVENOUS | Status: DC | PRN
  Administered 2015-12-25: 30 mg via INTRAVENOUS
  Administered 2015-12-25: 20 mg via INTRAVENOUS

## 2015-12-25 MED ORDER — PHENYLEPHRINE 40 MCG/ML (10ML) SYRINGE FOR IV PUSH (FOR BLOOD PRESSURE SUPPORT)
PREFILLED_SYRINGE | INTRAVENOUS | Status: AC
  Filled 2015-12-25: qty 10

## 2015-12-25 SURGICAL SUPPLY — 44 items
APPLICATOR COTTON TIP 6IN STRL (MISCELLANEOUS) ×1 IMPLANT
BLADE EXTENDED COATED 6.5IN (ELECTRODE) ×2 IMPLANT
BLADE HEX COATED 2.75 (ELECTRODE) ×1 IMPLANT
COVER MAYO STAND STRL (DRAPES) IMPLANT
COVER SURGICAL LIGHT HANDLE (MISCELLANEOUS) ×1 IMPLANT
DRAIN CHANNEL 19F RND (DRAIN) ×2 IMPLANT
DRAPE LAPAROSCOPIC ABDOMINAL (DRAPES) ×3 IMPLANT
DRAPE WARM FLUID 44X44 (DRAPE) ×2 IMPLANT
ELECT REM PT RETURN 9FT ADLT (ELECTROSURGICAL) ×3
ELECTRODE REM PT RTRN 9FT ADLT (ELECTROSURGICAL) ×1 IMPLANT
EVACUATOR SILICONE 100CC (DRAIN) ×2 IMPLANT
GAUZE SPONGE 4X4 12PLY STRL (GAUZE/BANDAGES/DRESSINGS) ×1 IMPLANT
GLOVE BIOGEL PI IND STRL 7.0 (GLOVE) ×1 IMPLANT
GLOVE BIOGEL PI INDICATOR 7.0 (GLOVE) ×2
GLOVE INDICATOR 8.0 STRL GRN (GLOVE) ×14 IMPLANT
GLOVE SS BIOGEL STRL SZ 8 (GLOVE) ×1 IMPLANT
GLOVE SUPERSENSE BIOGEL SZ 8 (GLOVE) ×2
GOWN STRL REUS W/TWL LRG LVL3 (GOWN DISPOSABLE) ×7 IMPLANT
GOWN STRL REUS W/TWL XL LVL3 (GOWN DISPOSABLE) ×2 IMPLANT
HANDLE SUCTION POOLE (INSTRUMENTS) IMPLANT
KIT BASIN OR (CUSTOM PROCEDURE TRAY) ×3 IMPLANT
LIGASURE IMPACT 36 18CM CVD LR (INSTRUMENTS) ×2 IMPLANT
NS IRRIG 1000ML POUR BTL (IV SOLUTION) ×5 IMPLANT
PACK GENERAL/GYN (CUSTOM PROCEDURE TRAY) ×3 IMPLANT
POUCH OSTOMY 1 PC DRNBL  2 1/2 (OSTOMY) IMPLANT
POUCH OSTOMY 2 1/2 (OSTOMY) ×3
RELOAD PROXIMATE 75MM BLUE (ENDOMECHANICALS) ×6 IMPLANT
RELOAD STAPLE 75 3.8 BLU REG (ENDOMECHANICALS) IMPLANT
SPONGE LAP 18X18 X RAY DECT (DISPOSABLE) ×2 IMPLANT
STAPLER PROXIMATE 75MM BLUE (STAPLE) ×2 IMPLANT
STAPLER VISISTAT 35W (STAPLE) ×1 IMPLANT
SUCTION POOLE HANDLE (INSTRUMENTS) ×3
SUT ETHILON 3 0 PS 1 (SUTURE) ×2 IMPLANT
SUT PDS AB 1 CTX 36 (SUTURE) ×4 IMPLANT
SUT SILK 2 0 (SUTURE)
SUT SILK 2-0 18XBRD TIE 12 (SUTURE) IMPLANT
SUT SILK 3 0 (SUTURE)
SUT SILK 3 0 SH CR/8 (SUTURE) IMPLANT
SUT SILK 3-0 18XBRD TIE 12 (SUTURE) IMPLANT
SUT VIC AB 2-0 SH 18 (SUTURE) ×4 IMPLANT
SUT VIC AB 3-0 SH 18 (SUTURE) IMPLANT
TOWEL OR 17X26 10 PK STRL BLUE (TOWEL DISPOSABLE) ×4 IMPLANT
TRAY FOLEY W/METER SILVER 16FR (SET/KITS/TRAYS/PACK) ×2 IMPLANT
YANKAUER SUCT BULB TIP NO VENT (SUCTIONS) IMPLANT

## 2015-12-25 NOTE — Progress Notes (Signed)
Pt seen examined and chart reviewed  She has diffuse peritonitis on examination and needs emergent exploration.  She desires everything to be done at this point and given her advanced age discussed non operative management as well.  She will unlikely survive without exploratory  Laparotomy   Pt understands the need for a colostomy,  Open abdomen,  ICU care  Prolonged ventilatory support need for other surgery and mortality risk of 30 % in her circumstance    She may require blood products and more plasma but waiting will reduce her chances of a successful outcome   The family under stands and agrees to proceed to OR  Will contact CCM as well to assist in postoperative management.

## 2015-12-25 NOTE — Transfer of Care (Signed)
Immediate Anesthesia Transfer of Care Note  Patient: Candace Cain  Procedure(s) Performed: Procedure(s): EXPLORATORY LAPAROTOMY, SIGMOID COLECTOMY, COLOSTOMY (N/A)  Patient Location: PACU  Anesthesia Type:General  Level of Consciousness:  sedated, patient cooperative and responds to stimulation  Airway & Oxygen Therapy:Patient Spontanous Breathing and Patient connected to face mask oxgen  Post-op Assessment:  Report given to PACU RN and Post -op Vital signs reviewed and stable  Post vital signs:  Reviewed and stable  Last Vitals:  Vitals:   12/25/15 1830 12/25/15 2145  BP: 135/73   Pulse: 84   Resp: 16   Temp: 36.6 C 36.4 C    Complications: No apparent anesthesia complications

## 2015-12-25 NOTE — Anesthesia Postprocedure Evaluation (Signed)
Anesthesia Post Note  Patient: Roxanna Archibald  Procedure(s) Performed: Procedure(s) (LRB): EXPLORATORY LAPAROTOMY, SIGMOID COLECTOMY, COLOSTOMY (N/A)  Patient location during evaluation: PACU Anesthesia Type: General Level of consciousness: awake and alert and patient cooperative Pain management: pain level controlled Vital Signs Assessment: post-procedure vital signs reviewed and stable Respiratory status: spontaneous breathing, nonlabored ventilation, respiratory function stable and patient connected to nasal cannula oxygen Cardiovascular status: blood pressure returned to baseline and stable (Atrial fibrillation) Postop Assessment: no signs of nausea or vomiting Anesthetic complications: no Comments: Patient discussed with CCM physician in PACU.    Last Vitals:  Vitals:   12/25/15 2146 12/25/15 2149  BP: (!) 152/93 (!) 161/81  Pulse:  67  Resp:  17  Temp:      Last Pain:  Vitals:   12/25/15 1830  TempSrc: Oral  PainSc:                  Cecile HearingStephen Edward Alaylah [place]ington

## 2015-12-25 NOTE — H&P (Signed)
PULMONARY / CRITICAL CARE MEDICINE   Name: Candace Cain MRN: [id number] DOB: 1920-03-25    ADMISSION DATE:  12/24/2015  CHIEF COMPLAINT:  Abdominal Pain  HISTORY OF PRESENT ILLNESS:   Please see H&P dated 12/14 by Dr. Caleb PoppNettey as well as consult notes from the same day by Dr. Abbey Chattersosenbower as well as a follow up note on 12/15 for complete details, but briefly, Candace Cain is a 82 y/o woman with a hx of PAF on coumadin who presented with abdominal pain and perforated diverticulitis who underwent ex-lap with repair and ostomy placement this evening following INR reversal. She was extubated in the PACU and is being admitted to the ICU for follow-up. She tolerated the procedure well.  PAST MEDICAL HISTORY :  She  has a past medical history of Alopecia; Atrial fibrillation (HCC); Bradycardia; and Unspecified essential hypertension.  PAST SURGICAL HISTORY: She  has a past surgical history that includes hysterectomy, unspecifed area; Hip Arthroplasty (Right, 04/27/2014); and neuropathy.  No Known Allergies  No current facility-administered medications on file prior to encounter.    Current Outpatient Prescriptions on File Prior to Encounter  Medication Sig  . amLODipine (NORVASC) 5 MG tablet Take 5 mg by mouth daily.   . Calcium Carbonate-Vitamin D (TH CALCIUM CARBONATE-VITAMIN D) 600-400 MG-UNIT per tablet Take 2 tablets by mouth daily.    Marland Kitchen. levothyroxine (SYNTHROID, LEVOTHROID) 25 MCG tablet Take 25 mcg by mouth daily before breakfast.  . losartan (COZAAR) 100 MG tablet Take 100 mg by mouth daily.  . vitamin B-12 (CYANOCOBALAMIN) 1000 MCG tablet Take 1,000 mcg by mouth daily.  Marland Kitchen. warfarin (COUMADIN) 3 MG tablet Take 1 tablet (3 mg total) by mouth daily. (Patient not taking: Reported on 12/24/2015)    FAMILY HISTORY:  Reviewed and noncontribuatory  SOCIAL HISTORY: She  reports that she has quit smoking. She has never used smokeless tobacco. She reports that she drinks alcohol. She reports  that she does not use drugs.  REVIEW OF SYSTEMS:   Per HPI   VITAL SIGNS: BP (!) 161/81   Pulse 67   Temp 97.6 F (36.4 C)   Resp 17   Ht 5' 1.5" (1.562 m)   Wt 127 lb (57.6 kg)   SpO2 100%   BMI 23.61 kg/m   HEMODYNAMICS: Normotensive    VENTILATOR SETTINGS: On suppl. O2.    INTAKE / OUTPUT: I/O last 3 completed shifts: In: 3075.7 [I.V.:1381.7; Blood:594; IV Piggyback:1100] Out: 1500 [Urine:850; Emesis/NG output:650]  PHYSICAL EXAMINATION: General:  Elderly woman lying in bed in the PACU, extubated, no distress Neuro:  Awake, but not alert, minimally responsive (left OR about 8 minutes prior). HEENT:  Dry MM Cardiovascular:  Irreg rhythm (AF), but normal S1/S2. Lungs:  CTA, normal RR Abdomen:  Ostomy and dressing observed, but not disturbed. JP drain in place Musculoskeletal:  No deformities Skin:  No rashes on visible skin.  LABS:  BMET  Recent Labs Lab 12/24/15 0850 12/25/15 0358  NA 135 134*  K 4.0 3.4*  CL 96* 101  CO2 24 24  BUN 34* 27*  CREATININE 1.20* 0.88  GLUCOSE 109* 100*    Electrolytes  Recent Labs Lab 12/24/15 0850 12/25/15 0358  CALCIUM 9.3 8.5*    CBC  Recent Labs Lab 12/24/15 0850 12/25/15 0358  WBC 31.7* 21.6*  HGB 13.9 11.9*  HCT 41.2 36.4  PLT 338 383    Coag's  Recent Labs Lab 12/24/15 1326 12/25/15 0358 12/25/15 1900  INR 2.96 3.67 1.20  Sepsis Markers  Recent Labs Lab 12/24/15 1022 12/24/15 1338  LATICACIDVEN 0.90 0.64    ABG No results for input(s): PHART, PCO2ART, PO2ART in the last 168 hours.  Liver Enzymes  Recent Labs Lab 12/24/15 0850  AST 24  ALT 16  ALKPHOS 106  BILITOT 0.9  ALBUMIN 3.7    Cardiac Enzymes No results for input(s): TROPONINI, PROBNP in the last 168 hours.  Glucose No results for input(s): GLUCAP in the last 168 hours.  Imaging Dg Abd 2 Views  Result Date: 12/25/2015 CLINICAL DATA:  Loose and watery stools.  Abdominal pain. EXAM: ABDOMEN - 2 VIEW  COMPARISON:  12/24/2015; CT abdomen pelvis - 12/24/2015 FINDINGS: Enteric tube tip and side port projected the expected location of the gastric fundus. Re- demonstrated moderate to marked gas distention of several centralized loops of small bowel with index loop measuring approximately 4.8 cm in diameter, unchanged to slightly progressed since the 12/2012 examination. These findings are again associated with a conspicuous paucity of distal colonic gas. Nondiagnostic evaluation for pneumoperitoneum secondary supine positioning exclusion a lower thorax. No definite pneumatosis or portal venous gas. No radiopaque pill fragment overlies the left mid hemiabdomen. Punctate phleboliths overlies the left hemipelvis. Faster calcifications overlie the pelvis bilaterally Post right bipolar hip replacement, incompletely evaluated. IMPRESSION: 1. Similar findings compatible with high-grade small bowel obstruction, unchanged to minimally worsened in the interval. 2. Enteric tube tip and side port project over the gastric fundus. Electronically Signed   By: Simonne ComeJohn  Watts M.D.   On: 12/25/2015 09:07    STUDIES:  CT abd 12/14 > obstruction  CULTURES: Blood 12/14 >> NGTD Urine 12/14 >> NGTD  ANTIBIOTICS: Pip-tazo 12/14 >>  SIGNIFICANT EVENTS: Ex-lap 12/15 for perforated diverticulum with ostomy placement  LINES/TUBES: PIV x2 (18, 20) JP drain 12/15 Colostomy LLQ 12/15 NGT 12/14 Foley 12/14  DISCUSSION: 82 y/o woman with perforated diverticulum  ASSESSMENT / PLAN:  PULMONARY A: High risk for respiratory failure given recent major surgery P:   Doing well for now off mechanical ventilation. Will need ICU-level observation for now.  CARDIOVASCULAR A:  PAF P:  - Normotensive, regular HR for now - May need additional fluids, but had crystaloid + albumin in OR  RENAL A:   Renal insuffiency P:   Creatinine of 0.88 belies the severity of AKI. Elevated BUN and low UOP suggest AKI. Will hold off on  additional fluids for now as I believe the risk of volume overload is greater than the risk posed by AKI given her cardiac state and current hemodynamics.  GASTROINTESTINAL A:   Perforated diverticulum P:   Appreciate SGY input.  HEMATOLOGIC A:   Previously on coumadin, s/p SGY P:  Hold coumadin Start heparin PPX tomorrow  INFECTIOUS A:   SBO with perforation P:   Continue zosyn.  ENDOCRINE A:   No active issues  P:    NEUROLOGIC A:   No active issues P:   RASS goal: 0   FAMILY  - Updates: Not updated by PCCM today  - Inter-disciplinary family meet or Palliative Care meeting due by:  01/01/16   CRITICAL CARE Performed by: Jamie KatoRIMBLE, Kaidence Sant   Total critical care time: 60 minutes  Critical care time was exclusive of separately billable procedures and treating other patients.  Critical care was necessary to treat or prevent imminent or life-threatening deterioration.  Critical care was time spent personally by me on the following activities: development of treatment plan with patient and/or surrogate as well as nursing,  discussions with consultants, evaluation of patient's response to treatment, examination of patient, obtaining history from patient or surrogate, ordering and performing treatments and interventions, ordering and review of laboratory studies, ordering and review of radiographic studies, pulse oximetry and re-evaluation of patient's condition.  Jamie Kato, MD Pulmonary and Critical Care Medicine [facility] Pager: [phone]  12/25/2015, 10:11 PM

## 2015-12-25 NOTE — Progress Notes (Signed)
Patient ID: Candace Cain, female   DOB: [birth year], 82 y.o.   MRN: [id number]  [facility]     Subjective: Continued abdominal pain, ok with pain medication but it is severe when the medication wears off. Describes the pain as cramping. Denies flatus or BM. Some nausea, no vomiting.  Objective: Vital signs in last 24 hours: Temp:  [98.1 F (36.7 C)-98.6 F (37 C)] 98.5 F (36.9 C) (12/15 0544) Pulse Rate:  [57-104] 104 (12/15 0544) Resp:  [16-18] 18 (12/15 0544) BP: (128-167)/(67-101) 166/101 (12/15 0544) SpO2:  [91 %-98 %] 91 % (12/15 0544) Last BM Date: 12/21/15  Intake/Output from previous day: 12/14 0701 - 12/15 0700 In: 2481.7 [I.V.:1381.7; IV Piggyback:1100] Out: 950 [Urine:300; Emesis/NG output:650] Intake/Output this shift: No intake/output data recorded.  PE: Gen:  Alert, NAD, pleasant Card:  irregular rhythm, tachycardic Pulm:  CTAB, no W/R/R Abd: Soft, distended, hypoactive BS, TTP globally about the abdomen but most severe in RLQ and LLQ  NGT 650cc/24hr  Lab Results:   Recent Labs  12/24/15 0850 12/25/15 0358  WBC 31.7* 21.6*  HGB 13.9 11.9*  HCT 41.2 36.4  PLT 338 383   BMET  Recent Labs  12/24/15 0850 12/25/15 0358  NA 135 134*  K 4.0 3.4*  CL 96* 101  CO2 24 24  GLUCOSE 109* 100*  BUN 34* 27*  CREATININE 1.20* 0.88  CALCIUM 9.3 8.5*   PT/INR  Recent Labs  12/24/15 1326 12/25/15 0358  LABPROT 31.4* 37.4*  INR 2.96 3.67   CMP     Component Value Date/Time   NA 134 (L) 12/25/2015 0358   K 3.4 (L) 12/25/2015 0358   CL 101 12/25/2015 0358   CO2 24 12/25/2015 0358   GLUCOSE 100 (H) 12/25/2015 0358   BUN 27 (H) 12/25/2015 0358   CREATININE 0.88 12/25/2015 0358   CALCIUM 8.5 (L) 12/25/2015 0358   PROT 7.2 12/24/2015 0850   ALBUMIN 3.7 12/24/2015 0850   AST 24 12/24/2015 0850   ALT 16 12/24/2015 0850   ALKPHOS 106 12/24/2015 0850   BILITOT 0.9 12/24/2015 0850   GFRNONAA 55 (L) 12/25/2015 0358    GFRAA >60 12/25/2015 0358   Lipase     Component Value Date/Time   LIPASE 19 12/24/2015 0850       Studies/Results: Ct Abdomen Pelvis W Contrast  Result Date: 12/24/2015 CLINICAL DATA:  Abdominal pain, distension, and nausea. EXAM: CT ABDOMEN AND PELVIS WITH CONTRAST TECHNIQUE: Multidetector CT imaging of the abdomen and pelvis was performed using the standard protocol following bolus administration of intravenous contrast. CONTRAST:  60mL ISOVUE-300 IOPAMIDOL (ISOVUE-300) INJECTION 61% COMPARISON:  Abdominal ultrasound from 07/27/2005 and 11/27/2006. Abdominal radiograph 03/29/2014 FINDINGS: Lower chest: Cylindrical bronchiectasis in both lower lobes with associated airway thickening. Coronary atherosclerotic calcification. Hepatobiliary: 1.6 by 1.0 by 2.3 cm low-density lesion along the posterior capsular margin of the right hepatic lobe, image 15/2. This is slightly higher in density than the perihepatic ascites. Dependent higher density in the gallbladder, likely from sludge. Pancreas: Unremarkable Spleen: Unremarkable Adrenals/Urinary Tract: Adrenal glands normal. Distal ureters obscured by streak artifact from the right hip implant. No hydronephrosis. There is a calcification in the vicinity of the right proximal ureter but thought to likely be outside of the ureter on image 71/5. This is probably a vascular calcification. There is some equivocal accentuated wall thickening/ wall enhancement of both renal collecting systems. Stomach/Bowel: There is swirling of the mesentery associated with small bowel obstruction, dilated bowel loops  up to 4.7 cm, lead point for the small bowel obstruction in the region of swirling mesentery shown on images 44 through 59 of series 2 in the central abdomen at about the level of the iliac crests. The small bowel in the pelvis is indistinct due to a variety of factors. I am suspicious that there is acute sigmoid diverticulitis superimposed on the acute fell and  fractured, and extra luminal fluid/ abscess along the sigmoid colon is not excluded. Some of this appearance may actually be due to edematous and irregular small bowel loops in this vicinity the but the sigmoid colon appears inflamed and with abnormal fluid collection along its margins suspicious for abscess. The distal most loops of small bowel are not distended. Vascular/Lymphatic: Aortoiliac atherosclerotic vascular disease. No adenopathy identified. Reproductive: Obscured by streak artifact from the right hip hemiarthroplasty. Other: Abnormal diffuse mesenteric stranding.  Perihepatic ascites. Musculoskeletal: Age-appropriate lumbar spondylosis and degenerative disc disease. IMPRESSION: 1. Acute small bowel obstruction with mesenteric edema and mesenteric vessels swirling around a lead point at transition of dilated to nondilated small bowel, suspicious for volvulus. This is shown about at the level of the iliac crests in the central abdomen, for example images 44 through 59 series 2. Closed loop ischemia of the involved loop of pelvic small bowel extending down into the pelvis is not entirely excluded. Surgical consultation recommended. 2. There also appears to be acute sigmoid colon diverticulitis with possible abscesses along the margin of the sigmoid colon. This is immediately adjacent to the involved loop of small bowel which extends down into the pelvis which complicates the assessment of both processes. Moreover, there is streak artifact from a right hip hemiarthroplasty which obscures findings as well. 3.  Aortoiliac atherosclerotic vascular disease. 4. Perihepatic ascites and diffuse mesenteric stranding in the abdomen. 5. There is a low-density collection or lesion along the posterior margin of the liver, this may simply be a localized part of the ascites, and is less likely to be some sort of nodular peritoneal implant. These results will be called to the ordering clinician or representative by the  Radiologist Assistant, and communication documented in the PACS or zVision Dashboard. Electronically Signed   By: Gaylyn RongWalter  Liebkemann M.D.   On: 12/24/2015 11:54   Dg Abd 2 Views  Result Date: 12/25/2015 CLINICAL DATA:  Loose and watery stools.  Abdominal pain. EXAM: ABDOMEN - 2 VIEW COMPARISON:  12/24/2015; CT abdomen pelvis - 12/24/2015 FINDINGS: Enteric tube tip and side port projected the expected location of the gastric fundus. Re- demonstrated moderate to marked gas distention of several centralized loops of small bowel with index loop measuring approximately 4.8 cm in diameter, unchanged to slightly progressed since the 12/2012 examination. These findings are again associated with a conspicuous paucity of distal colonic gas. Nondiagnostic evaluation for pneumoperitoneum secondary supine positioning exclusion a lower thorax. No definite pneumatosis or portal venous gas. No radiopaque pill fragment overlies the left mid hemiabdomen. Punctate phleboliths overlies the left hemipelvis. Faster calcifications overlie the pelvis bilaterally Post right bipolar hip replacement, incompletely evaluated. IMPRESSION: 1. Similar findings compatible with high-grade small bowel obstruction, unchanged to minimally worsened in the interval. 2. Enteric tube tip and side port project over the gastric fundus. Electronically Signed   By: Simonne ComeJohn  Watts M.D.   On: 12/25/2015 09:07   Dg Abd Portable 1v-small Bowel Protocol-position Verification  Result Date: 12/24/2015 CLINICAL DATA:  NG tube placement EXAM: PORTABLE ABDOMEN - 1 VIEW COMPARISON:  CT scan same day FINDINGS: Gaseous  distended small bowel loops in the abdomen suspicious for bowel obstruction. NG tube with tip in mid stomach. IMPRESSION: NG tube with tip in mid stomach. Electronically Signed   By: Natasha Mead M.D.   On: 12/24/2015 15:19    Anti-infectives: Anti-infectives    Start     Dose/Rate Route Frequency Ordered Stop   12/24/15 1800   piperacillin-tazobactam (ZOSYN) IVPB 3.375 g     3.375 g 12.5 mL/hr over 240 Minutes Intravenous Every 8 hours 12/24/15 1016     12/24/15 1015  piperacillin-tazobactam (ZOSYN) IVPB 3.375 g     3.375 g 100 mL/hr over 30 Minutes Intravenous  Once 12/24/15 1013 12/24/15 1101       Assessment/Plan Abdominal pain, nausea x7-10 days SBO, possible volvulus Sigmoid diverticulitis with possible abscesses - CT scan shows acute small bowel obstruction with mesenteric edema suspicious for volvulus; Closed loop ischemia of the involved small bowel is not entirely excluded; acute sigmoid colon diverticulitis with possible abscesses  - LA WNL, WBC trending down 21.6 today from 31.7, more tachycardic and hypertensive this AM - XR this AM shows similar or slightly worse SBO  - NGT 650cc/24hr  AKI - improved with fluid resuscitation, Cr 0.88 Leukocytosis - WBC 21.6 Atrial fibrillation - hold coumadin, INR 3.67, give 2mg  vitamin K this AM HTN - on losartan and norvasc at home  Plan - WBC trending down, lactic acid WNL, but patient is clinically worse and more tachycardic and hypertensive than yesterday. XR shows similar or slightly worse SBO today. May not improve without surgery. Continue NPO/NGT, IVF, pain control (increase morphine to q3 hours and add robaxin q8 PRN). 2mg  vitamin K given this morning; will discuss with MD whether or not FFP should be ordered for quicker reversal of coumadin.    LOS: 1 day    Edson Snowball , [facility] Surgery 12/25/2015, 10:31 AM Pager: [phone] Consults: [phone] Mon-Fri 7:00 am-4:30 pm Sat-Sun 7:00 am-11:30 am

## 2015-12-25 NOTE — Progress Notes (Signed)
Rn calling eMD  CCM bedside notes indicate to dc all IVF but there are actie fluid orders  P,lan - fluid orders dc'ed =- saline lock started  Dr. Kalman ShanMurali Hannia Matchett, M.D., [facility]P Pulmonary and Critical Care Medicine Staff Physician [facility] [place] [facility] and Critical [facility] Pager: [phone], If no answer or between  15:00h - 7:00h: call [phone]  12/25/2015 11:44 PM

## 2015-12-25 NOTE — Progress Notes (Signed)
PROGRESS NOTE  Candace Cain [name]:[id number] DOB: February 10, 1920 DOA: 12/24/2015 PCP: Duane LopeAlan Ross, MD  HPI/Recap of past 24 hours:  On NG suction, has intermittent abdominal cramping, not passing gas Niece at bedside  Assessment/Plan: Principal Problem:   Small bowel obstruction Active Problems:   Essential hypertension   Atrial fibrillation (HCC)   Hypothyroidism   Diverticulitis  Small bowel obstruction CT showed concern for volvulus. Discussed with surgery who does not feel presentation is consistent with volvulus. -surgery recommendations: . ?surgery in AM.  -IVF/analgesics/ NPO, NG tube with intermittent suction, Reverse INR -repeat kub in am "high-grade small bowel obstruction, unchanged to minimally worsened in the interval",  General surgery recommended "exploratory laparotomy, possible small and large bowel resection, possible colostomy", patient is the process of making desicions  Diverticulitis Ct ab: "acute sigmoid colon diverticulitis with possible abscesses along the margin of the sigmoid colon. This is immediately adjacent to the involved loop of small bowel which extends down into the pelvis which complicates the assessment of both processes. Perihepatic ascites and diffuse mesenteric stranding in the abdomen." -continue Zosyn   paroxysmal Atrial fibrillation with h/o bradycardia Sinus rhythm with a few ectopic atrial beats on ekg, she is not on any rate or rhythm control agent at home. Patient on coumadin. INR of 2.96. Rate controlled. In atrial fibrillation per exam. -reverse coumadin with vitamin K 2mg , getting ffp by surgery in anticipating operation.    Hypothyroidism -synthroid 12.5mcg IV daily  Hypertension -hold antihypertensives while NPO -hydralazine prn  baseline, independent, lives by herself, still drives and cooks  DVT prophylaxis: SCDs Code Status: DNR Family Communication: patient and niece at bedside Disposition Plan:  pending Consults called: General surgery  Procedures:  none  Antibiotics:  Zosyn from admission   Objective: BP (!) 166/101 (BP Location: Left Arm)   Pulse (!) 104   Temp 98.5 F (36.9 C) (Oral)   Resp 18   Ht 5' 1.5" (1.562 m)   Wt 57.6 kg (127 lb)   SpO2 91%   BMI 23.61 kg/m   Intake/Output Summary (Last 24 hours) at 12/25/15 0837 Last data filed at 12/25/15 0600  Gross per 24 hour  Intake          2481.67 ml  Output              950 ml  Net          1531.67 ml   Filed Weights   12/24/15 0815  Weight: 57.6 kg (127 lb)    Exam:   General:  Look younger than stated age, no NG suction, NAD, aaox3, wearing hearing aids, heard of hearing  Cardiovascular: RRR  Respiratory: CTABL  Abdomen: diminished BS, tender  Musculoskeletal: No Edema  Neuro: aaox3  Data Reviewed: Basic Metabolic Panel:  Recent Labs Lab 12/24/15 0850 12/25/15 0358  NA 135 134*  K 4.0 3.4*  CL 96* 101  CO2 24 24  GLUCOSE 109* 100*  BUN 34* 27*  CREATININE 1.20* 0.88  CALCIUM 9.3 8.5*   Liver Function Tests:  Recent Labs Lab 12/24/15 0850  AST 24  ALT 16  ALKPHOS 106  BILITOT 0.9  PROT 7.2  ALBUMIN 3.7    Recent Labs Lab 12/24/15 0850  LIPASE 19   No results for input(s): AMMONIA in the last 168 hours. CBC:  Recent Labs Lab 12/24/15 0850 12/25/15 0358  WBC 31.7* 21.6*  HGB 13.9 11.9*  HCT 41.2 36.4  MCV 89.0 88.6  PLT 338 383  Cardiac Enzymes:   No results for input(s): CKTOTAL, CKMB, CKMBINDEX, TROPONINI in the last 168 hours. BNP (last 3 results) No results for input(s): BNP in the last 8760 hours.  ProBNP (last 3 results) No results for input(s): PROBNP in the last 8760 hours.  CBG: No results for input(s): GLUCAP in the last 168 hours.  No results found for this or any previous visit (from the past 240 hour(s)).   Studies: Ct Abdomen Pelvis W Contrast  Result Date: 12/24/2015 CLINICAL DATA:  Abdominal pain, distension, and nausea.  EXAM: CT ABDOMEN AND PELVIS WITH CONTRAST TECHNIQUE: Multidetector CT imaging of the abdomen and pelvis was performed using the standard protocol following bolus administration of intravenous contrast. CONTRAST:  60mL ISOVUE-300 IOPAMIDOL (ISOVUE-300) INJECTION 61% COMPARISON:  Abdominal ultrasound from 07/27/2005 and 11/27/2006. Abdominal radiograph 03/29/2014 FINDINGS: Lower chest: Cylindrical bronchiectasis in both lower lobes with associated airway thickening. Coronary atherosclerotic calcification. Hepatobiliary: 1.6 by 1.0 by 2.3 cm low-density lesion along the posterior capsular margin of the right hepatic lobe, image 15/2. This is slightly higher in density than the perihepatic ascites. Dependent higher density in the gallbladder, likely from sludge. Pancreas: Unremarkable Spleen: Unremarkable Adrenals/Urinary Tract: Adrenal glands normal. Distal ureters obscured by streak artifact from the right hip implant. No hydronephrosis. There is a calcification in the vicinity of the right proximal ureter but thought to likely be outside of the ureter on image 71/5. This is probably a vascular calcification. There is some equivocal accentuated wall thickening/ wall enhancement of both renal collecting systems. Stomach/Bowel: There is swirling of the mesentery associated with small bowel obstruction, dilated bowel loops up to 4.7 cm, lead point for the small bowel obstruction in the region of swirling mesentery shown on images 44 through 59 of series 2 in the central abdomen at about the level of the iliac crests. The small bowel in the pelvis is indistinct due to a variety of factors. I am suspicious that there is acute sigmoid diverticulitis superimposed on the acute fell and fractured, and extra luminal fluid/ abscess along the sigmoid colon is not excluded. Some of this appearance may actually be due to edematous and irregular small bowel loops in this vicinity the but the sigmoid colon appears inflamed and with  abnormal fluid collection along its margins suspicious for abscess. The distal most loops of small bowel are not distended. Vascular/Lymphatic: Aortoiliac atherosclerotic vascular disease. No adenopathy identified. Reproductive: Obscured by streak artifact from the right hip hemiarthroplasty. Other: Abnormal diffuse mesenteric stranding.  Perihepatic ascites. Musculoskeletal: Age-appropriate lumbar spondylosis and degenerative disc disease. IMPRESSION: 1. Acute small bowel obstruction with mesenteric edema and mesenteric vessels swirling around a lead point at transition of dilated to nondilated small bowel, suspicious for volvulus. This is shown about at the level of the iliac crests in the central abdomen, for example images 44 through 59 series 2. Closed loop ischemia of the involved loop of pelvic small bowel extending down into the pelvis is not entirely excluded. Surgical consultation recommended. 2. There also appears to be acute sigmoid colon diverticulitis with possible abscesses along the margin of the sigmoid colon. This is immediately adjacent to the involved loop of small bowel which extends down into the pelvis which complicates the assessment of both processes. Moreover, there is streak artifact from a right hip hemiarthroplasty which obscures findings as well. 3.  Aortoiliac atherosclerotic vascular disease. 4. Perihepatic ascites and diffuse mesenteric stranding in the abdomen. 5. There is a low-density collection or lesion along the posterior margin  of the liver, this may simply be a localized part of the ascites, and is less likely to be some sort of nodular peritoneal implant. These results will be called to the ordering clinician or representative by the Radiologist Assistant, and communication documented in the PACS or zVision Dashboard. Electronically Signed   By: Gaylyn Rong M.D.   On: 12/24/2015 11:54   Dg Abd Portable 1v-small Bowel Protocol-position Verification  Result Date:  12/24/2015 CLINICAL DATA:  NG tube placement EXAM: PORTABLE ABDOMEN - 1 VIEW COMPARISON:  CT scan same day FINDINGS: Gaseous distended small bowel loops in the abdomen suspicious for bowel obstruction. NG tube with tip in mid stomach. IMPRESSION: NG tube with tip in mid stomach. Electronically Signed   By: Natasha Mead M.D.   On: 12/24/2015 15:19    Scheduled Meds: . levothyroxine  12.5 mcg Intravenous QAC breakfast  . phytonadione (VITAMIN K) IV  2 mg Intravenous Once  . piperacillin-tazobactam (ZOSYN)  IV  3.375 g Intravenous Q8H    Continuous Infusions: . dextrose 5 % and 0.45% NaCl 100 mL/hr at 12/24/15 1754     Time spent:  Melanie Openshaw MD, PhD  [facility] Pager [phone]. If 7PM-7AM, please contact night-coverage at [email or web address], password [facility] 12/25/2015, 8:37 AM  LOS: 1 day

## 2015-12-25 NOTE — Op Note (Addendum)
Preoperative diagnosis: Perforated sigmoid diverticulitis with peritonitis and small bowel obstruction  Postoperative diagnosis: Same  Procedure: Exploratory laparotomy with sigmoid colectomy and creation of colostomy  Surgeon: Erroll Luna M.D.  Anesthesia: Gen.  EBL: Minimal  Specimen: Sigmoid colon to pathology  Drains: 19 round drain to pelvis  Indications for procedure: The patient is a 82 year old female admitted yesterday to the medical service for perforated diverticulitis. Her condition worsened today. She developed diffuse peritonitis. She also small bowel obstruction from this. She was evaluated by general surgery laparotomy was recommended. Given her advanced age a long discussion about the pros and cons of surgery and her case the potential mortality of surgery. Nonoperative management and a high likelihood of failing in this circumstance. She opted for surgery.The procedure was discussed with the patient. partial colectomy discussed with the patient as well as non operative treatments. The risks of operative management include bleeding,  Infection,  Leak of anastamosis,  Ostomy formation, open procedure,  Sepsis,  Abcess,  Hernia,  DVT,  Pulmonary complications,  Cardiovascular  complications,  Injury to ureter,  Bladder,kidney,and anesthesia risks,  And death. The patient understands.  Questions answered.   The success of the procedure is 50-85 % for treating the patients symptoms. They agree to proceed.  Description of procedure: The patient was met in the holding area with her family. Questions were answered. She was taken back to the operating room. She was placed upon the operating room table. After induction of general anesthesia, Foley catheter was placed sterile conditions. The abdomen was prepped and draped in a sterile fashion and timeout was done. She was on preoperative antibiotics. After timeout was done, a midline incision was used the abdominal cavity was entered.  There is copious amounts of foul-smelling fluid material consistent with perforated diverticulitis Hinchey 4.  The patient has small bowel instruction from this as well. Retractor was placed. I was able to run the small bowel release the obstruction which is the distal ileum where it had become adherent to the perforation the sigmoid colon. A GIA-75 stapling device was fired across the distal descending colon. The sigmoid colon was resected to include the area of disease Hartman's pouch was left. This was passed off the field. LigaSure was used to control the mesentery as well as 2-0 Vicryl. Left ureter is identified and preserved. Right ureter was preserved. Copious irrigation was used. Drain placed in the pelvis to right or or stab incision and secured with 2-0 nylon. Left lower quadrant ostomy formed. After copious irrigation abdominal fascia was closed with #1 PDS and skin was packed open with saline soaked Kerlix. The left upper quadrant colostomy was matured with 2-0 Vicryl. All final counts found to be correct. Ostomy appliance placed. Patient was taken to recovery room in critical but stable condition.

## 2015-12-25 NOTE — Anesthesia Procedure Notes (Addendum)
Procedure Name: Intubation Date/Time: 12/25/2015 8:06 PM Performed by: Early OsmondEARGLE, Olga Bourbeau E Pre-anesthesia Checklist: Patient identified, Emergency Drugs available, Suction available and Patient being monitored Patient Re-evaluated:Patient Re-evaluated prior to inductionOxygen Delivery Method: Circle system utilized Preoxygenation: Pre-oxygenation with 100% oxygen Intubation Type: IV induction, Rapid sequence and Cricoid Pressure applied Ventilation: Mask ventilation without difficulty Laryngoscope Size: Glidescope Grade View: Grade I Tube type: Oral Tube size: 7.0 mm Number of attempts: 1 Airway Equipment and Method: Stylet,  Oral airway and Video-laryngoscopy Placement Confirmation: ETT inserted through vocal cords under direct vision,  positive ETCO2 and breath sounds checked- equal and bilateral Secured at: 21 cm Tube secured with: Tape Dental Injury: Teeth and Oropharynx as per pre-operative assessment  Difficulty Due To: Difficulty was unanticipated, Difficult Airway- due to reduced neck mobility and Difficult Airway- due to anterior larynx Comments: Initial attempt with Hyacinth MeekerMiller 2, anterior a/w and limited neck mobility, change to Glidescope with grade 1 view. Atraumatic intubation.

## 2015-12-25 NOTE — Progress Notes (Signed)
Initial Nutrition Assessment  DOCUMENTATION CODES:   Severe malnutrition in context of chronic illness  INTERVENTION:  Will monitor decisions regarding goals of care and whether patient will pursue operation or not.  Patient now NPO day 7 (could not tolerate intake prior to admission). If patient to pursue full scope of treatment and remains NPO on 12/18, will need to consider nutrition support at that time.  NUTRITION DIAGNOSIS:   Inadequate oral intake related to inability to eat as evidenced by NPO status.  GOAL:   Patient will meet greater than or equal to 90% of their needs  MONITOR:   PO intake, Supplement acceptance, Diet advancement, Labs, Weight trends, I & O's  REASON FOR ASSESSMENT:   Malnutrition Screening Tool    ASSESSMENT:   82 year old female with medical history significant for atrial fibrillation, hypertension, hypothyroidism who presents with 1 week of abdominal pain, constipation, nausea. Patient found to have acute small bowel obstruction with mesenteric edema suspicious for volvulus and acute sigmoid colon diverticulitis with possible abscesses.   -Patient's niece and daughter are coming to see her now. Per Surgery note, patient will decide if she wants to pursue exploratory laparotomy with possible small and large bowel resection and possible colostomy or if she will want to remain comfortable and continue medical treatment only.  Spoke with patient at bedside. She endorsed she has had nausea, abdominal pain, and constipation for the past week. She also endorses abdominal distension. She is now day 7 without any food as she could not tolerate food. Prior to onset of symptoms, she reports eating 3 light meals daily.   Patient with NG tube to low intermittent suction. On assessment noted output of approximately 650 ml.  Patient reports UBW of 120 lbs and that she believes she has been gaining weight with her distention. Limited weight history in chart to  confirm, but patient was 129 lbs on 10/20/2015.   Medications reviewed and include: levothyroxine, Zosyn, D5-1/2NS @ 100 ml/hr (120 grams dextrose, 408 kcal daily), morphine 4 mg Q4hrs PRN.  Labs reviewed: Sodium 134, Potassium 3.4, BUN 27, Glucose 100, Prothrombin Time 37.4.   Nutrition-Focused physical exam completed. Findings are moderate-severe fat depletion, moderate-severe muscle depletion, and no edema.   Patient meets criteria for severe chronic malnutrition in setting of severe fat depletion, severe muscle depletion.  Discussed with RN.   Diet Order:  Diet NPO time specified  Skin:  Reviewed, no issues  Last BM:  Unknown  Height:   Ht Readings from Last 1 Encounters:  12/24/15 5' 1.5" (1.562 m)    Weight:   Wt Readings from Last 1 Encounters:  12/24/15 127 lb (57.6 kg)    Ideal Body Weight:  48.9 kg  BMI:  Body mass index is 23.61 kg/m.  Estimated Nutritional Needs:   Kcal:  1425-1641 (MSJ x 1.3-1.5)  Protein:  80-92 grams (1.4-1.6 grams/kg)  Fluid:  >/= 1.4 L/day (25 ml/kg)  EDUCATION NEEDS:   No education needs identified at this time  Helane RimaLeanne Jomari Bartnik, MS, RD, LDN Pager: [phone] After Hours Pager: [phone]

## 2015-12-25 NOTE — Anesthesia Preprocedure Evaluation (Signed)
Anesthesia Evaluation  Patient identified by MRN, date of birth, ID band Patient awake    Reviewed: Allergy & Precautions, NPO status , Patient's Chart, lab work & pertinent test results  Airway Mallampati: II  TM Distance: >3 FB Neck ROM: Full    Dental  (+) Teeth Intact, Dental Advisory Given   Pulmonary former smoker,    Pulmonary exam normal breath sounds clear to auscultation       Cardiovascular hypertension, Pt. on medications (-) angina(-) CAD, (-) Past MI and (-) CHF + dysrhythmias Atrial Fibrillation  Rhythm:Regular Rate:Normal     Neuro/Psych PSYCHIATRIC DISORDERS Depression negative neurological ROS     GI/Hepatic Neg liver ROS, GERD  ,  Endo/Other  Hypothyroidism   Renal/GU negative Renal ROS     Musculoskeletal negative musculoskeletal ROS (+)   Abdominal   Peds  Hematology negative hematology ROS (+) Blood dyscrasia (Warfarin therapy), ,   Anesthesia Other Findings Day of surgery medications reviewed with the patient.  Reproductive/Obstetrics                             Anesthesia Physical Anesthesia Plan  ASA: III and emergent  Anesthesia Plan: General   Post-op Pain Management:    Induction: Intravenous, Rapid sequence and Cricoid pressure planned  Airway Management Planned: Oral ETT  Additional Equipment: Arterial line  Intra-op Plan:   Post-operative Plan: Possible Post-op intubation/ventilation  Informed Consent: I have reviewed the patients History and Physical, chart, labs and discussed the procedure including the risks, benefits and alternatives for the proposed anesthesia with the patient or authorized representative who has indicated his/her understanding and acceptance.   Dental advisory given  Plan Discussed with: CRNA  Anesthesia Plan Comments: (Risks/benefits of general anesthesia discussed with patient including risk of damage to teeth, lips,  gum, and tongue, nausea/vomiting, allergic reactions to medications, and the possibility of heart attack, stroke and death.  All patient questions answered.  Patient wishes to proceed.)        Anesthesia Quick Evaluation

## 2015-12-26 DIAGNOSIS — K56609 Unspecified intestinal obstruction, unspecified as to partial versus complete obstruction: Secondary | ICD-10-CM

## 2015-12-26 DIAGNOSIS — E876 Hypokalemia: Secondary | ICD-10-CM

## 2015-12-26 DIAGNOSIS — K5720 Diverticulitis of large intestine with perforation and abscess without bleeding: Secondary | ICD-10-CM

## 2015-12-26 LAB — CBC WITH DIFFERENTIAL/PLATELET
Basophils Absolute: 0 K/uL (ref 0.0–0.1)
Basophils Relative: 0 %
Eosinophils Absolute: 0 K/uL (ref 0.0–0.7)
Eosinophils Relative: 0 %
HCT: 36.5 % (ref 36.0–46.0)
Hemoglobin: 12.1 g/dL (ref 12.0–15.0)
Lymphocytes Relative: 2 %
Lymphs Abs: 0.5 K/uL — ABNORMAL LOW (ref 0.7–4.0)
MCH: 29.2 pg (ref 26.0–34.0)
MCHC: 33.2 g/dL (ref 30.0–36.0)
MCV: 88.2 fL (ref 78.0–100.0)
Monocytes Absolute: 0.8 K/uL (ref 0.1–1.0)
Monocytes Relative: 3 %
Neutro Abs: 25.9 K/uL — ABNORMAL HIGH (ref 1.7–7.7)
Neutrophils Relative %: 95 %
Platelets: 341 K/uL (ref 150–400)
RBC: 4.14 MIL/uL (ref 3.87–5.11)
RDW: 14.1 % (ref 11.5–15.5)
WBC: 27.2 K/uL — ABNORMAL HIGH (ref 4.0–10.5)

## 2015-12-26 LAB — URINE CULTURE

## 2015-12-26 LAB — BASIC METABOLIC PANEL WITH GFR
Anion gap: 10 (ref 5–15)
BUN: 29 mg/dL — ABNORMAL HIGH (ref 6–20)
CO2: 24 mmol/L (ref 22–32)
Calcium: 8.3 mg/dL — ABNORMAL LOW (ref 8.9–10.3)
Chloride: 105 mmol/L (ref 101–111)
Creatinine, Ser: 1.01 mg/dL — ABNORMAL HIGH (ref 0.44–1.00)
GFR calc Af Amer: 54 mL/min — ABNORMAL LOW
GFR calc non Af Amer: 46 mL/min — ABNORMAL LOW
Glucose, Bld: 111 mg/dL — ABNORMAL HIGH (ref 65–99)
Potassium: 3.5 mmol/L (ref 3.5–5.1)
Sodium: 139 mmol/L (ref 135–145)

## 2015-12-26 LAB — MAGNESIUM: Magnesium: 1.9 mg/dL (ref 1.7–2.4)

## 2015-12-26 LAB — PROTIME-INR
INR: 1.26
Prothrombin Time: 15.9 s — ABNORMAL HIGH (ref 11.4–15.2)

## 2015-12-26 LAB — MRSA PCR SCREENING: MRSA BY PCR: NEGATIVE

## 2015-12-26 LAB — TSH: TSH: 0.725 u[IU]/mL (ref 0.350–4.500)

## 2015-12-26 LAB — LACTIC ACID, PLASMA: LACTIC ACID, VENOUS: 0.6 mmol/L (ref 0.5–1.9)

## 2015-12-26 MED ORDER — METOPROLOL TARTRATE 5 MG/5ML IV SOLN
2.5000 mg | Freq: Two times a day (BID) | INTRAVENOUS | Status: DC
  Administered 2015-12-26 – 2016-01-03 (×16): 2.5 mg via INTRAVENOUS
  Filled 2015-12-26 (×17): qty 5

## 2015-12-26 MED ORDER — PANTOPRAZOLE SODIUM 40 MG IV SOLR
40.0000 mg | INTRAVENOUS | Status: DC
  Administered 2015-12-26 – 2016-01-03 (×9): 40 mg via INTRAVENOUS
  Filled 2015-12-26 (×8): qty 40

## 2015-12-26 MED ORDER — POTASSIUM CHLORIDE 2 MEQ/ML IV SOLN
30.0000 meq | Freq: Once | INTRAVENOUS | Status: AC
  Administered 2015-12-26: 30 meq via INTRAVENOUS
  Filled 2015-12-26: qty 15

## 2015-12-26 MED ORDER — MAGNESIUM SULFATE 2 GM/50ML IV SOLN
2.0000 g | Freq: Once | INTRAVENOUS | Status: AC
  Administered 2015-12-26: 2 g via INTRAVENOUS
  Filled 2015-12-26: qty 50

## 2015-12-26 MED ORDER — FENTANYL CITRATE (PF) 100 MCG/2ML IJ SOLN
25.0000 ug | INTRAMUSCULAR | Status: DC | PRN
  Administered 2015-12-26 – 2015-12-27 (×5): 50 ug via INTRAVENOUS
  Filled 2015-12-26 (×4): qty 2

## 2015-12-26 MED ORDER — FENTANYL CITRATE (PF) 100 MCG/2ML IJ SOLN
INTRAMUSCULAR | Status: AC
  Filled 2015-12-26: qty 2

## 2015-12-26 NOTE — Progress Notes (Signed)
During hourly rounding, it was noted that pt. had picked on her abdominal dressings and partially removed it. Underlying 4x4 gauze had some light pink drainage as expected.  Reinforced the dressing. Will change dressing in the morning.

## 2015-12-26 NOTE — Progress Notes (Signed)
   RN calling eMD  Hypertensive Tachycardic  On cam cexam - seems to bein pain  Plan fent prn and reassess  Dr. Kalman ShanMurali Everette Dimauro, M.D., [facility] HospitalF.C.C.P Pulmonary and Critical Care Medicine Staff Physician [facility] Pager: [phone], If no answer or between  15:00h - 7:00h: call [phone]  12/26/2015 3:38 AM

## 2015-12-26 NOTE — Progress Notes (Signed)
1 Day Post-Op  Subjective: Awake and alert   Objective: Vital signs in last 24 hours: Temp:  [97.6 F (36.4 C)-98.8 F (37.1 C)] 98.7 F (37.1 C) (12/16 0800) Pulse Rate:  [64-102] 78 (12/16 0600) Resp:  [8-28] 14 (12/16 0600) BP: (99-173)/(61-129) 143/71 (12/16 0600) SpO2:  [92 %-100 %] 98 % (12/16 0600) Weight:  [59 kg (130 lb 1.1 oz)-61 kg (134 lb 7.7 oz)] 59 kg (130 lb 1.1 oz) (12/16 0500) Last BM Date: 12/21/15  Intake/Output from previous day: 12/15 0701 - 12/16 0700 In: 2094 [I.V.:1100; Blood:594; IV Piggyback:400] Out: 1390 [Urine:915; Drains:375; Blood:100] Intake/Output this shift: No intake/output data recorded.  Incision/Wound:open   Packed  Ostomy   Viable  No output  JP serous  Lab Results:   Recent Labs  12/25/15 0358 12/26/15 0321  WBC 21.6* 27.2*  HGB 11.9* 12.1  HCT 36.4 36.5  PLT 383 341   BMET  Recent Labs  12/25/15 0358 12/26/15 0321  NA 134* 139  K 3.4* 3.5  CL 101 105  CO2 24 24  GLUCOSE 100* 111*  BUN 27* 29*  CREATININE 0.88 1.01*  CALCIUM 8.5* 8.3*   PT/INR  Recent Labs  12/25/15 1900 12/26/15 0321  LABPROT 15.3* 15.9*  INR 1.20 1.26   ABG No results for input(s): PHART, HCO3 in the last 72 hours.  Invalid input(s): PCO2, PO2  Studies/Results: Ct Abdomen Pelvis W Contrast  Result Date: 12/24/2015 CLINICAL DATA:  Abdominal pain, distension, and nausea. EXAM: CT ABDOMEN AND PELVIS WITH CONTRAST TECHNIQUE: Multidetector CT imaging of the abdomen and pelvis was performed using the standard protocol following bolus administration of intravenous contrast. CONTRAST:  60mL ISOVUE-300 IOPAMIDOL (ISOVUE-300) INJECTION 61% COMPARISON:  Abdominal ultrasound from 07/27/2005 and 11/27/2006. Abdominal radiograph 03/29/2014 FINDINGS: Lower chest: Cylindrical bronchiectasis in both lower lobes with associated airway thickening. Coronary atherosclerotic calcification. Hepatobiliary: 1.6 by 1.0 by 2.3 cm low-density lesion along the  posterior capsular margin of the right hepatic lobe, image 15/2. This is slightly higher in density than the perihepatic ascites. Dependent higher density in the gallbladder, likely from sludge. Pancreas: Unremarkable Spleen: Unremarkable Adrenals/Urinary Tract: Adrenal glands normal. Distal ureters obscured by streak artifact from the right hip implant. No hydronephrosis. There is a calcification in the vicinity of the right proximal ureter but thought to likely be outside of the ureter on image 71/5. This is probably a vascular calcification. There is some equivocal accentuated wall thickening/ wall enhancement of both renal collecting systems. Stomach/Bowel: There is swirling of the mesentery associated with small bowel obstruction, dilated bowel loops up to 4.7 cm, lead point for the small bowel obstruction in the region of swirling mesentery shown on images 44 through 59 of series 2 in the central abdomen at about the level of the iliac crests. The small bowel in the pelvis is indistinct due to a variety of factors. I am suspicious that there is acute sigmoid diverticulitis superimposed on the acute fell and fractured, and extra luminal fluid/ abscess along the sigmoid colon is not excluded. Some of this appearance may actually be due to edematous and irregular small bowel loops in this vicinity the but the sigmoid colon appears inflamed and with abnormal fluid collection along its margins suspicious for abscess. The distal most loops of small bowel are not distended. Vascular/Lymphatic: Aortoiliac atherosclerotic vascular disease. No adenopathy identified. Reproductive: Obscured by streak artifact from the right hip hemiarthroplasty. Other: Abnormal diffuse mesenteric stranding.  Perihepatic ascites. Musculoskeletal: Age-appropriate lumbar spondylosis and degenerative disc  disease. IMPRESSION: 1. Acute small bowel obstruction with mesenteric edema and mesenteric vessels swirling around a lead point at  transition of dilated to nondilated small bowel, suspicious for volvulus. This is shown about at the level of the iliac crests in the central abdomen, for example images 44 through 59 series 2. Closed loop ischemia of the involved loop of pelvic small bowel extending down into the pelvis is not entirely excluded. Surgical consultation recommended. 2. There also appears to be acute sigmoid colon diverticulitis with possible abscesses along the margin of the sigmoid colon. This is immediately adjacent to the involved loop of small bowel which extends down into the pelvis which complicates the assessment of both processes. Moreover, there is streak artifact from a right hip hemiarthroplasty which obscures findings as well. 3.  Aortoiliac atherosclerotic vascular disease. 4. Perihepatic ascites and diffuse mesenteric stranding in the abdomen. 5. There is a low-density collection or lesion along the posterior margin of the liver, this may simply be a localized part of the ascites, and is less likely to be some sort of nodular peritoneal implant. These results will be called to the ordering clinician or representative by the Radiologist Assistant, and communication documented in the PACS or zVision Dashboard. Electronically Signed   By: Gaylyn RongWalter  Liebkemann M.D.   On: 12/24/2015 11:54   Dg Abd 2 Views  Result Date: 12/25/2015 CLINICAL DATA:  Loose and watery stools.  Abdominal pain. EXAM: ABDOMEN - 2 VIEW COMPARISON:  12/24/2015; CT abdomen pelvis - 12/24/2015 FINDINGS: Enteric tube tip and side port projected the expected location of the gastric fundus. Re- demonstrated moderate to marked gas distention of several centralized loops of small bowel with index loop measuring approximately 4.8 cm in diameter, unchanged to slightly progressed since the 12/2012 examination. These findings are again associated with a conspicuous paucity of distal colonic gas. Nondiagnostic evaluation for pneumoperitoneum secondary supine  positioning exclusion a lower thorax. No definite pneumatosis or portal venous gas. No radiopaque pill fragment overlies the left mid hemiabdomen. Punctate phleboliths overlies the left hemipelvis. Faster calcifications overlie the pelvis bilaterally Post right bipolar hip replacement, incompletely evaluated. IMPRESSION: 1. Similar findings compatible with high-grade small bowel obstruction, unchanged to minimally worsened in the interval. 2. Enteric tube tip and side port project over the gastric fundus. Electronically Signed   By: Simonne ComeJohn  Watts M.D.   On: 12/25/2015 09:07   Dg Abd Portable 1v-small Bowel Protocol-position Verification  Result Date: 12/24/2015 CLINICAL DATA:  NG tube placement EXAM: PORTABLE ABDOMEN - 1 VIEW COMPARISON:  CT scan same day FINDINGS: Gaseous distended small bowel loops in the abdomen suspicious for bowel obstruction. NG tube with tip in mid stomach. IMPRESSION: NG tube with tip in mid stomach. Electronically Signed   By: Natasha MeadLiviu  Pop M.D.   On: 12/24/2015 15:19    Anti-infectives: Anti-infectives    Start     Dose/Rate Route Frequency Ordered Stop   12/24/15 1800  piperacillin-tazobactam (ZOSYN) IVPB 3.375 g     3.375 g 12.5 mL/hr over 240 Minutes Intravenous Every 8 hours 12/24/15 1016     12/24/15 1015  piperacillin-tazobactam (ZOSYN) IVPB 3.375 g     3.375 g 100 mL/hr over 30 Minutes Intravenous  Once 12/24/15 1013 12/24/15 1101      Assessment/Plan: s/p Procedure(s): EXPLORATORY LAPAROTOMY, SIGMOID COLECTOMY, COLOSTOMY (N/A) OK to get out of bed  Can return to floor once ok with medicine   Keep NPO  Ice chips ok  LOS: 2 days  Candace Cain A. 12/26/2015

## 2015-12-26 NOTE — Progress Notes (Signed)
PCCM PROGRESS NOTE  Subjective: Abdominal pain better.  Vital signs: BP (!) 148/76   Pulse (!) 102   Temp 98.3 F (36.8 C) (Axillary)   Resp (!) 8   Ht 5' 1.5" (1.562 m)   Wt 134 lb 7.7 oz (61 kg)   SpO2 97%   BMI 25.00 kg/m   Intake/output: I/O last 3 completed shifts: In: 3075.7 [I.V.:1381.7; Blood:594; IV Piggyback:1100] Out: 1500 [Urine:850; Emesis/NG output:650]  General: thin Neuro: follows commands HEENT: NG tube in, no stridor Cardiac: regular, no murmur Chest: no wheeze Abd: wound site clean Ext: no edema Skin: no rashes  CMP Latest Ref Rng & Units 12/26/2015 12/25/2015 12/24/2015  Glucose 65 - 99 mg/dL 213(Y111(H) 865(H100(H) 846(N109(H)  BUN 6 - 20 mg/dL 62(X29(H) 52(W27(H) 41(L34(H)  Creatinine 0.44 - 1.00 mg/dL 2.44(W1.01(H) 1.020.88 7.25(D1.20(H)  Sodium 135 - 145 mmol/L 139 134(L) 135  Potassium 3.5 - 5.1 mmol/L 3.5 3.4(L) 4.0  Chloride 101 - 111 mmol/L 105 101 96(L)  CO2 22 - 32 mmol/L 24 24 24   Calcium 8.9 - 10.3 mg/dL 8.3(L) 8.5(L) 9.3  Total Protein 6.5 - 8.1 g/dL - - 7.2  Total Bilirubin 0.3 - 1.2 mg/dL - - 0.9  Alkaline Phos 38 - 126 U/L - - 106  AST 15 - 41 U/L - - 24  ALT 14 - 54 U/L - - 16    CBC Latest Ref Rng & Units 12/26/2015 12/25/2015 12/24/2015  WBC 4.0 - 10.5 K/uL 27.2(H) 21.6(H) 31.7(H)  Hemoglobin 12.0 - 15.0 g/dL 66.412.1 11.9(L) 13.9  Hematocrit 36.0 - 46.0 % 36.5 36.4 41.2  Platelets 150 - 400 K/uL 341 383 338    ABG No results found for: PHART, PCO2ART, PO2ART, HCO3, TCO2, ACIDBASEDEF, O2SAT   CBG (last 3)  No results for input(s): GLUCAP in the last 72 hours.   Imaging: Ct Abdomen Pelvis W Contrast  Result Date: 12/24/2015 CLINICAL DATA:  Abdominal pain, distension, and nausea. EXAM: CT ABDOMEN AND PELVIS WITH CONTRAST TECHNIQUE: Multidetector CT imaging of the abdomen and pelvis was performed using the standard protocol following bolus administration of intravenous contrast. CONTRAST:  60mL ISOVUE-300 IOPAMIDOL (ISOVUE-300) INJECTION 61% COMPARISON:   Abdominal ultrasound from 07/27/2005 and 11/27/2006. Abdominal radiograph 03/29/2014 FINDINGS: Lower chest: Cylindrical bronchiectasis in both lower lobes with associated airway thickening. Coronary atherosclerotic calcification. Hepatobiliary: 1.6 by 1.0 by 2.3 cm low-density lesion along the posterior capsular margin of the right hepatic lobe, image 15/2. This is slightly higher in density than the perihepatic ascites. Dependent higher density in the gallbladder, likely from sludge. Pancreas: Unremarkable Spleen: Unremarkable Adrenals/Urinary Tract: Adrenal glands normal. Distal ureters obscured by streak artifact from the right hip implant. No hydronephrosis. There is a calcification in the vicinity of the right proximal ureter but thought to likely be outside of the ureter on image 71/5. This is probably a vascular calcification. There is some equivocal accentuated wall thickening/ wall enhancement of both renal collecting systems. Stomach/Bowel: There is swirling of the mesentery associated with small bowel obstruction, dilated bowel loops up to 4.7 cm, lead point for the small bowel obstruction in the region of swirling mesentery shown on images 44 through 59 of series 2 in the central abdomen at about the level of the iliac crests. The small bowel in the pelvis is indistinct due to a variety of factors. I am suspicious that there is acute sigmoid diverticulitis superimposed on the acute fell and fractured, and extra luminal fluid/ abscess along the sigmoid colon is not excluded. Some  of this appearance may actually be due to edematous and irregular small bowel loops in this vicinity the but the sigmoid colon appears inflamed and with abnormal fluid collection along its margins suspicious for abscess. The distal most loops of small bowel are not distended. Vascular/Lymphatic: Aortoiliac atherosclerotic vascular disease. No adenopathy identified. Reproductive: Obscured by streak artifact from the right hip  hemiarthroplasty. Other: Abnormal diffuse mesenteric stranding.  Perihepatic ascites. Musculoskeletal: Age-appropriate lumbar spondylosis and degenerative disc disease. IMPRESSION: 1. Acute small bowel obstruction with mesenteric edema and mesenteric vessels swirling around a lead point at transition of dilated to nondilated small bowel, suspicious for volvulus. This is shown about at the level of the iliac crests in the central abdomen, for example images 44 through 59 series 2. Closed loop ischemia of the involved loop of pelvic small bowel extending down into the pelvis is not entirely excluded. Surgical consultation recommended. 2. There also appears to be acute sigmoid colon diverticulitis with possible abscesses along the margin of the sigmoid colon. This is immediately adjacent to the involved loop of small bowel which extends down into the pelvis which complicates the assessment of both processes. Moreover, there is streak artifact from a right hip hemiarthroplasty which obscures findings as well. 3.  Aortoiliac atherosclerotic vascular disease. 4. Perihepatic ascites and diffuse mesenteric stranding in the abdomen. 5. There is a low-density collection or lesion along the posterior margin of the liver, this may simply be a localized part of the ascites, and is less likely to be some sort of nodular peritoneal implant. These results will be called to the ordering clinician or representative by the Radiologist Assistant, and communication documented in the PACS or zVision Dashboard. Electronically Signed   By: Gaylyn RongWalter  Liebkemann M.D.   On: 12/24/2015 11:54   Dg Abd 2 Views  Result Date: 12/25/2015 CLINICAL DATA:  Loose and watery stools.  Abdominal pain. EXAM: ABDOMEN - 2 VIEW COMPARISON:  12/24/2015; CT abdomen pelvis - 12/24/2015 FINDINGS: Enteric tube tip and side port projected the expected location of the gastric fundus. Re- demonstrated moderate to marked gas distention of several centralized loops  of small bowel with index loop measuring approximately 4.8 cm in diameter, unchanged to slightly progressed since the 12/2012 examination. These findings are again associated with a conspicuous paucity of distal colonic gas. Nondiagnostic evaluation for pneumoperitoneum secondary supine positioning exclusion a lower thorax. No definite pneumatosis or portal venous gas. No radiopaque pill fragment overlies the left mid hemiabdomen. Punctate phleboliths overlies the left hemipelvis. Faster calcifications overlie the pelvis bilaterally Post right bipolar hip replacement, incompletely evaluated. IMPRESSION: 1. Similar findings compatible with high-grade small bowel obstruction, unchanged to minimally worsened in the interval. 2. Enteric tube tip and side port project over the gastric fundus. Electronically Signed   By: Simonne ComeJohn  Watts M.D.   On: 12/25/2015 09:07   Dg Abd Portable 1v-small Bowel Protocol-position Verification  Result Date: 12/24/2015 CLINICAL DATA:  NG tube placement EXAM: PORTABLE ABDOMEN - 1 VIEW COMPARISON:  CT scan same day FINDINGS: Gaseous distended small bowel loops in the abdomen suspicious for bowel obstruction. NG tube with tip in mid stomach. IMPRESSION: NG tube with tip in mid stomach. Electronically Signed   By: Natasha MeadLiviu  Pop M.D.   On: 12/24/2015 15:19   Antibiotics: Zosyn 12/14 >>  Cultures:  Blood 12/14 >> Urine 12/14 >>  Events:  12/15 Laparotomy  Summary: 82 yo female with perforated diverticulum s/p laparotomy.  Initial concern that she would need to remain on vent  post op, but extubated successfully.  She is DNR.  Assessment/plan:  Perforated diverticulum. - post op care, nutrition per [facility] - day 3 of Abx  PAF. Hx of HTN. - monitor hemodynamics - heparin gtt when okay with surgery  Hx of hypothyroidism. - synthroid  Will transfer to telemetry.  Will ask [facility] to resume primary care from 12/17 and PCCM off.  Coralyn Helling, MD [facility] Pulmonary/Critical  Care 12/26/2015, 6:28 AM Pager:  [phone] After 3pm call: [phone]

## 2015-12-26 NOTE — Progress Notes (Signed)
PROGRESS NOTE  Candace Cain WGN:[id number]:[id number] DOB: [birth year] DOA: 12/24/2015 PCP: Duane LopeAlan Ross, MD  HPI/Recap of past 24 hours:  Post op day one, on NG suction, daughter at bedside   Assessment/Plan: Principal Problem:   Small bowel obstruction Active Problems:   Essential hypertension   Atrial fibrillation (HCC)   Hypothyroidism   Diverticulitis   Protein-calorie malnutrition, severe   Perforated bowel (HCC)    Perforated sigmoid diverticulitis with peritonitis and small bowel obstruction s/p Exploratory laparotomy with sigmoid colectomy and creation of colostomy on 12/15 On ng suction, iv abx, appreciate general surgery and critical care input   paroxysmal Atrial fibrillation with h/o bradycardia she is not on any rate or rhythm control agent at home. Patient on coumadin at home. INR of 2.96 on admission.   -reverse coumadin with vitamin K 2mg  and  ffp prior to surgery on 12/15,Heparin drip when ok with general surgery Post op patient heart rate increased with intermittent afib, will try low dose iv lopressor, close monitor heart rate., keep mag>2, k>4   Hypokalemia: replace k,  Hypothyroidism -synthroid 12.5mcg IV daily  Hypertension -hold antihypertensives while NPO -hydralazine prn  baseline, independent, lives by herself, still drives and cooks  DVT prophylaxis: SCDs Code Status: DNR Family Communication: patient and daughter at bedside Disposition Plan: pending Consults called:  General surgery Critical care  Procedures:  s/p Exploratory laparotomy with sigmoid colectomy and creation of colostomy on 12/15 by Dr. Harriette Bouillonhomas Cornett.  Antibiotics:  Zosyn from admission   Objective: BP (!) 143/71   Pulse 78   Temp 98.7 F (37.1 C) (Oral)   Resp 14   Ht 5' 1.5" (1.562 m)   Wt 59 kg (130 lb 1.1 oz)   SpO2 98%   BMI 24.18 kg/m   Intake/Output Summary (Last 24 hours) at 12/26/15 0855 Last data filed at 12/26/15 0600  Gross per 24 hour  Intake              2094 ml  Output             1390 ml  Net              704 ml   Filed Weights   12/24/15 0815 12/25/15 2245 12/26/15 0500  Weight: 57.6 kg (127 lb) 61 kg (134 lb 7.7 oz) 59 kg (130 lb 1.1 oz)    Exam:   General:  Look younger than stated age, no NG suction, NAD, aaox3, wearing hearing aids, heard of hearing, very pleasant  Cardiovascular: RRR with ectopic atrial beats  Respiratory: CTABL  Abdomen: post op changes, +colostomy  Musculoskeletal: No Edema  Neuro: aaox3  Data Reviewed: Basic Metabolic Panel:  Recent Labs Lab 12/24/15 0850 12/25/15 0358 12/26/15 0321  NA 135 134* 139  K 4.0 3.4* 3.5  CL 96* 101 105  CO2 24 24 24   GLUCOSE 109* 100* 111*  BUN 34* 27* 29*  CREATININE 1.20* 0.88 1.01*  CALCIUM 9.3 8.5* 8.3*  MG  --   --  1.9   Liver Function Tests:  Recent Labs Lab 12/24/15 0850  AST 24  ALT 16  ALKPHOS 106  BILITOT 0.9  PROT 7.2  ALBUMIN 3.7    Recent Labs Lab 12/24/15 0850  LIPASE 19   No results for input(s): AMMONIA in the last 168 hours. CBC:  Recent Labs Lab 12/24/15 0850 12/25/15 0358 12/26/15 0321  WBC 31.7* 21.6* 27.2*  NEUTROABS  --   --  25.9*  HGB 13.9 11.9*  12.1  HCT 41.2 36.4 36.5  MCV 89.0 88.6 88.2  PLT 338 383 341   Cardiac Enzymes:   No results for input(s): CKTOTAL, CKMB, CKMBINDEX, TROPONINI in the last 168 hours. BNP (last 3 results) No results for input(s): BNP in the last 8760 hours.  ProBNP (last 3 results) No results for input(s): PROBNP in the last 8760 hours.  CBG: No results for input(s): GLUCAP in the last 168 hours.  Recent Results (from the past 240 hour(s))  Blood culture (routine x 2)     Status: None (Preliminary result)   Collection Time: 12/24/15  9:56 AM  Result Value Ref Range Status   Specimen Description BLOOD LEFT HAND  Final   Special Requests IN PEDIATRIC BOTTLE 1ML  Final   Culture   Final    NO GROWTH 1 DAY [facility]    Report Status  PENDING  Incomplete  Blood culture (routine x 2)     Status: None (Preliminary result)   Collection Time: 12/24/15 10:15 AM  Result Value Ref Range Status   Specimen Description BLOOD LEFT ANTECUBITAL  Final   Special Requests BOTTLES DRAWN AEROBIC AND ANAEROBIC 5ML  Final   Culture   Final    NO GROWTH 1 DAY [facility]    Report Status PENDING  Incomplete  MRSA PCR Screening     Status: None   Collection Time: 12/25/15 10:30 PM  Result Value Ref Range Status   MRSA by PCR NEGATIVE NEGATIVE Final    Comment:        The GeneXpert MRSA Assay (FDA approved for NASAL specimens only), is one component of a comprehensive MRSA colonization surveillance program. It is not intended to diagnose MRSA infection nor to guide or monitor treatment for MRSA infections.      Studies: Dg Abd 2 Views  Result Date: 12/25/2015 CLINICAL DATA:  Loose and watery stools.  Abdominal pain. EXAM: ABDOMEN - 2 VIEW COMPARISON:  12/24/2015; CT abdomen pelvis - 12/24/2015 FINDINGS: Enteric tube tip and side port projected the expected location of the gastric fundus. Re- demonstrated moderate to marked gas distention of several centralized loops of small bowel with index loop measuring approximately 4.8 cm in diameter, unchanged to slightly progressed since the 12/2012 examination. These findings are again associated with a conspicuous paucity of distal colonic gas. Nondiagnostic evaluation for pneumoperitoneum secondary supine positioning exclusion a lower thorax. No definite pneumatosis or portal venous gas. No radiopaque pill fragment overlies the left mid hemiabdomen. Punctate phleboliths overlies the left hemipelvis. Faster calcifications overlie the pelvis bilaterally Post right bipolar hip replacement, incompletely evaluated. IMPRESSION: 1. Similar findings compatible with high-grade small bowel obstruction, unchanged to minimally worsened in the interval. 2. Enteric tube tip and side  port project over the gastric fundus. Electronically Signed   By: Simonne ComeJohn  Watts M.D.   On: 12/25/2015 09:07    Scheduled Meds: . fentaNYL      . heparin  5,000 Units Subcutaneous Q8H  . levothyroxine  12.5 mcg Intravenous QAC breakfast  . pantoprazole (PROTONIX) IV  40 mg Intravenous Q24H  . piperacillin-tazobactam (ZOSYN)  IV  3.375 g Intravenous Q8H    Continuous Infusions:    Time spent: 35mins  Tiah Heckel MD, PhD  [facility] Pager [phone]. If 7PM-7AM, please contact night-coverage at [email or web address], password [email or web address] 12/26/2015, 8:55 AM  LOS: 2 days

## 2015-12-27 LAB — BASIC METABOLIC PANEL
Anion gap: 13 (ref 5–15)
BUN: 36 mg/dL — AB (ref 6–20)
CO2: 22 mmol/L (ref 22–32)
Calcium: 8.5 mg/dL — ABNORMAL LOW (ref 8.9–10.3)
Chloride: 109 mmol/L (ref 101–111)
Creatinine, Ser: 1.03 mg/dL — ABNORMAL HIGH (ref 0.44–1.00)
GFR calc Af Amer: 53 mL/min — ABNORMAL LOW (ref >60)
GFR calc non Af Amer: 45 mL/min — ABNORMAL LOW (ref >60)
GLUCOSE: 87 mg/dL (ref 65–99)
POTASSIUM: 3.8 mmol/L (ref 3.5–5.1)
Sodium: 144 mmol/L (ref 135–145)

## 2015-12-27 LAB — CBC
HEMATOCRIT: 34.6 % — AB (ref 36.0–46.0)
HEMOGLOBIN: 11.3 g/dL — AB (ref 12.0–15.0)
MCH: 29.2 pg (ref 26.0–34.0)
MCHC: 32.7 g/dL (ref 30.0–36.0)
MCV: 89.4 fL (ref 78.0–100.0)
Platelets: 366 10*3/uL (ref 150–400)
RBC: 3.87 MIL/uL (ref 3.87–5.11)
RDW: 14.6 % (ref 11.5–15.5)
WBC: 29.1 10*3/uL — AB (ref 4.0–10.5)

## 2015-12-27 MED ORDER — SODIUM CHLORIDE 0.9 % IV SOLN
30.0000 meq | Freq: Once | INTRAVENOUS | Status: AC
  Administered 2015-12-27: 30 meq via INTRAVENOUS
  Filled 2015-12-27: qty 15

## 2015-12-27 MED ORDER — METOPROLOL TARTRATE 5 MG/5ML IV SOLN
2.5000 mg | Freq: Once | INTRAVENOUS | Status: AC
  Administered 2015-12-27: 2.5 mg via INTRAVENOUS
  Filled 2015-12-27: qty 5

## 2015-12-27 MED ORDER — SODIUM CHLORIDE 0.9 % IV BOLUS (SEPSIS)
1000.0000 mL | Freq: Once | INTRAVENOUS | Status: AC
  Administered 2015-12-27: 1000 mL via INTRAVENOUS

## 2015-12-27 MED ORDER — SODIUM CHLORIDE 0.9 % IV SOLN
INTRAVENOUS | Status: DC
  Administered 2015-12-27: 16:00:00 via INTRAVENOUS

## 2015-12-27 MED ORDER — VITAMINS A & D EX OINT
TOPICAL_OINTMENT | CUTANEOUS | Status: AC
  Administered 2015-12-27: 02:00:00
  Filled 2015-12-27: qty 5

## 2015-12-27 NOTE — Progress Notes (Signed)
2 Days Post-Op  Subjective: Pain control an issue   Objective: Vital signs in last 24 hours: Temp:  [97.5 F (36.4 C)-98.7 F (37.1 C)] 97.5 F (36.4 C) (12/17 0607) Pulse Rate:  [74-108] 74 (12/17 0607) Resp:  [12-21] 16 (12/17 0607) BP: (114-173)/(71-90) 145/71 (12/17 0607) SpO2:  [92 %-98 %] 95 % (12/17 0607) Weight:  [57.5 kg (126 lb 12.2 oz)] 57.5 kg (126 lb 12.2 oz) (12/17 0607) Last BM Date: 12/21/15  Intake/Output from previous day: 12/16 0701 - 12/17 0700 In: 320 [P.O.:50; NG/GT:40; IV Piggyback:200] Out: 805 [Urine:465; Emesis/NG output:100; Drains:240] Intake/Output this shift: No intake/output data recorded.  Incision/Wound:open and clean  Ostomy viable no output JP serous   Lab Results:   Recent Labs  12/26/15 0321 12/27/15 0537  WBC 27.2* 29.1*  HGB 12.1 11.3*  HCT 36.5 34.6*  PLT 341 366   BMET  Recent Labs  12/26/15 0321 12/27/15 0537  NA 139 144  K 3.5 3.8  CL 105 109  CO2 24 22  GLUCOSE 111* 87  BUN 29* 36*  CREATININE 1.01* 1.03*  CALCIUM 8.3* 8.5*   PT/INR  Recent Labs  12/25/15 1900 12/26/15 0321  LABPROT 15.3* 15.9*  INR 1.20 1.26   ABG No results for input(s): PHART, HCO3 in the last 72 hours.  Invalid input(s): PCO2, PO2  Studies/Results: Dg Abd 2 Views  Result Date: 12/25/2015 CLINICAL DATA:  Loose and watery stools.  Abdominal pain. EXAM: ABDOMEN - 2 VIEW COMPARISON:  12/24/2015; CT abdomen pelvis - 12/24/2015 FINDINGS: Enteric tube tip and side port projected the expected location of the gastric fundus. Re- demonstrated moderate to marked gas distention of several centralized loops of small bowel with index loop measuring approximately 4.8 cm in diameter, unchanged to slightly progressed since the 12/2012 examination. These findings are again associated with a conspicuous paucity of distal colonic gas. Nondiagnostic evaluation for pneumoperitoneum secondary supine positioning exclusion a lower thorax. No definite  pneumatosis or portal venous gas. No radiopaque pill fragment overlies the left mid hemiabdomen. Punctate phleboliths overlies the left hemipelvis. Faster calcifications overlie the pelvis bilaterally Post right bipolar hip replacement, incompletely evaluated. IMPRESSION: 1. Similar findings compatible with high-grade small bowel obstruction, unchanged to minimally worsened in the interval. 2. Enteric tube tip and side port project over the gastric fundus. Electronically Signed   By: Simonne ComeJohn  Watts M.D.   On: 12/25/2015 09:07    Anti-infectives: Anti-infectives    Start     Dose/Rate Route Frequency Ordered Stop   12/24/15 1800  piperacillin-tazobactam (ZOSYN) IVPB 3.375 g     3.375 g 12.5 mL/hr over 240 Minutes Intravenous Every 8 hours 12/24/15 1016     12/24/15 1015  piperacillin-tazobactam (ZOSYN) IVPB 3.375 g     3.375 g 100 mL/hr over 30 Minutes Intravenous  Once 12/24/15 1013 12/24/15 1101      Assessment/Plan: s/p Procedure(s): EXPLORATORY LAPAROTOMY, SIGMOID COLECTOMY, COLOSTOMY (N/A) Keep NPO  Pain control an issue but asleep when I walk into the room  Continue ABX  Possibly remove NGT am   LOS: 3 days    Karlin Binion A. 12/27/2015

## 2015-12-27 NOTE — Progress Notes (Signed)
HR sustaining 120-140. BP 140/72. Notified MD. Adm Lopressor 2.5 mg IV. Will continue to monitor.

## 2015-12-27 NOTE — Progress Notes (Signed)
Pharmacy Antibiotic Note  Candace Cain is a 82 y.o. female admitted on 12/24/2015 with intra-abdominal infection.  Pharmacy has been consulted for Zosyn dosing.  Plan: Day 4 Zosyn - continue current dosing What is plan for length of therapy? Change to PO?  Height: 5' 1.5" (156.2 cm) Weight: 126 lb 12.2 oz (57.5 kg) IBW/kg (Calculated) : 48.95  Temp (24hrs), Avg:98 F (36.7 C), Min:97.5 F (36.4 C), Max:98.7 F (37.1 C)   Recent Labs Lab 12/24/15 0850 12/24/15 1022 12/24/15 1338 12/25/15 0358 12/26/15 0321 12/27/15 0537  WBC 31.7*  --   --  21.6* 27.2* 29.1*  CREATININE 1.20*  --   --  0.88 1.01* 1.03*  LATICACIDVEN  --  0.90 0.64  --  0.6  --     Estimated Creatinine Clearance: 26.4 mL/min (by C-G formula based on SCr of 1.03 mg/dL (H)).    No Known Allergies  Antimicrobials this admission: 12/14 Zosyn >>    Dose adjustments this admission: ---  Microbiology results: 12/14 BCx: ngtd 12/14 ucx: multiple species, suggest recollect   Thank you for allowing pharmacy to be a part of this patient's care.   Hessie KnowsJustin M Therma Lasure, PharmD, BCPS Pager [phone] 12/27/2015 11:56 AM

## 2015-12-27 NOTE — Progress Notes (Addendum)
PROGRESS NOTE  Candace Cain:[id number] DOB: 03-Mar-1920 DOA: 12/24/2015 PCP: Candace Cain, Candace Cain  HPI/Recap of past 24 hours:  Post op day two, on NG suction, patient is concerned about bleeding at surgical site   Assessment/Plan: Principal Problem:   Diverticulitis of colon with perforation s/p colectomy/colostomy 12/25/2015 Active Problems:   Essential hypertension   Atrial fibrillation (HCC)   Hypothyroidism   Small bowel obstruction   Protein-calorie malnutrition, severe   Perforated bowel (HCC)   Diverticulitis of large intestine with abscess without bleeding   Hypokalemia   Perforated sigmoid diverticulitis with peritonitis and small bowel obstruction s/p Exploratory laparotomy with sigmoid colectomy and creation of colostomy on 12/15 Patient report bleeding at surgical site, hgb stable, will continue hold anticoagulation. Npo, On ng suction, drain in place, continue iv abx/ ivf, appreciate general surgery and critical care input   paroxysmal Atrial fibrillation with h/o bradycardia she is not on any rate or rhythm control agent at home. Patient on coumadin at home. INR of 2.96 on admission.   -reverse coumadin with vitamin K 2mg  and  ffp prior to surgery on 12/15,Heparin drip when ok with general surgery Post op day one on 12/16, patient heart rate increased with intermittent afib, started on low dose iv lopressor, close monitor heart rate., keep mag>2, k>4   Hypokalemia: replace k,  Hypothyroidism -synthroid 12.5mcg IV daily  Hypertension -bp stable on low dose iv lopressor -hydralazine prn  Severe malnutrition in context of chronic illness  baseline, independent, lives by herself, still drives and cooks  DVT prophylaxis: SCDs Code Status: DNR Family Communication: patient  Disposition Plan: pending Consults called:  General surgery Critical care  Procedures:  s/p Exploratory laparotomy with sigmoid colectomy and creation of colostomy on 12/15 by  Dr. Harriette Bouillonhomas Cornett.  Antibiotics:  Zosyn from admission   Objective: BP (!) 145/71 (BP Location: Right Arm)   Pulse 74   Temp 97.5 F (36.4 C) (Oral)   Resp 16   Ht 5' 1.5" (1.562 m)   Wt 57.5 kg (126 lb 12.2 oz)   SpO2 95%   BMI 23.56 kg/m   Intake/Output Summary (Last 24 hours) at 12/27/15 1007 Last data filed at 12/27/15 0608  Gross per 24 hour  Intake              280 ml  Output              670 ml  Net             -390 ml   Filed Weights   12/25/15 2245 12/26/15 0500 12/27/15 [id number]  Weight: 61 kg (134 lb 7.7 oz) 59 kg (130 lb 1.1 oz) 57.5 kg (126 lb 12.2 oz)    Exam:   General:  Look younger than stated age, no NG suction, NAD, aaox3, wearing hearing aids, heard of hearing, very pleasant  Cardiovascular: RRR with ectopic atrial beats  Respiratory: CTABL  Abdomen: post op changes, +colostomy, + drain  Musculoskeletal: No Edema  Neuro: aaox3  Data Reviewed: Basic Metabolic Panel:  Recent Labs Lab 12/24/15 0850 12/25/15 0358 12/26/15 0321 12/27/15 0537  NA 135 134* 139 144  K 4.0 3.4* 3.5 3.8  CL 96* 101 105 109  CO2 24 24 24 22   GLUCOSE 109* 100* 111* 87  BUN 34* 27* 29* 36*  CREATININE 1.20* 0.88 1.01* 1.03*  CALCIUM 9.3 8.5* 8.3* 8.5*  MG  --   --  1.9  --    Liver Function Tests:  Recent Labs Lab 12/24/15 0850  AST 24  ALT 16  ALKPHOS 106  BILITOT 0.9  PROT 7.2  ALBUMIN 3.7    Recent Labs Lab 12/24/15 0850  LIPASE 19   No results for input(s): AMMONIA in the last 168 hours. CBC:  Recent Labs Lab 12/24/15 0850 12/25/15 0358 12/26/15 0321 12/27/15 0537  WBC 31.7* 21.6* 27.2* 29.1*  NEUTROABS  --   --  25.9*  --   HGB 13.9 11.9* 12.1 11.3*  HCT 41.2 36.4 36.5 34.6*  MCV 89.0 88.6 88.2 89.4  PLT 338 383 341 366   Cardiac Enzymes:   No results for input(s): CKTOTAL, CKMB, CKMBINDEX, TROPONINI in the last 168 hours. BNP (last 3 results) No results for input(s): BNP in the last 8760 hours.  ProBNP (last 3  results) No results for input(s): PROBNP in the last 8760 hours.  CBG: No results for input(s): GLUCAP in the last 168 hours.  Recent Results (from the past 240 hour(s))  Culture, Urine     Status: Abnormal   Collection Time: 12/24/15  8:50 AM  Result Value Ref Range Status   Specimen Description URINE, RANDOM  Final   Special Requests NONE  Final   Culture MULTIPLE SPECIES PRESENT, SUGGEST RECOLLECTION (A)  Final   Report Status 12/26/2015 FINAL  Final  Blood culture (routine x 2)     Status: None (Preliminary result)   Collection Time: 12/24/15  9:56 AM  Result Value Ref Range Status   Specimen Description BLOOD LEFT HAND  Final   Special Requests IN PEDIATRIC BOTTLE 1ML  Final   Culture   Final    NO GROWTH 2 DAYS Performed at [facility]    Report Status PENDING  Incomplete  Blood culture (routine x 2)     Status: None (Preliminary result)   Collection Time: 12/24/15 10:15 AM  Result Value Ref Range Status   Specimen Description BLOOD LEFT ANTECUBITAL  Final   Special Requests BOTTLES DRAWN AEROBIC AND ANAEROBIC 5ML  Final   Culture   Final    NO GROWTH 2 DAYS Performed at [facility]    Report Status PENDING  Incomplete  MRSA PCR Screening     Status: None   Collection Time: 12/25/15 10:30 PM  Result Value Ref Range Status   MRSA by PCR NEGATIVE NEGATIVE Final    Comment:        The GeneXpert MRSA Assay (FDA approved for NASAL specimens only), is one component of a comprehensive MRSA colonization surveillance program. It is not intended to diagnose MRSA infection nor to guide or monitor treatment for MRSA infections.      Studies: No results found.  Scheduled Meds: . heparin  5,000 Units Subcutaneous Q8H  . levothyroxine  12.5 mcg Intravenous QAC breakfast  . metoprolol  2.5 mg Intravenous Q12H  . pantoprazole (PROTONIX) IV  40 mg Intravenous Q24H  . piperacillin-tazobactam (ZOSYN)  IV  3.375 g Intravenous Q8H  . potassium chloride (KCL  MULTIRUN) 30 mEq in 265 mL IVPB  30 mEq Intravenous Once    Continuous Infusions:    Time spent: 35mins  Abbe Bula MD, PhD  [facility] Pager [phone]. If 7PM-7AM, please contact night-coverage at [email or web address], password [email or web address] 12/27/2015, 10:07 AM  LOS: 3 days

## 2015-12-27 NOTE — Progress Notes (Signed)
HR sustaining 130-150. BP 110/75. Oxygen saturations 96% on RA. MD notified. Will continue to monitor.

## 2015-12-28 ENCOUNTER — Encounter (HOSPITAL_COMMUNITY): Payer: Self-pay | Admitting: Surgery

## 2015-12-28 LAB — BASIC METABOLIC PANEL
ANION GAP: 11 (ref 5–15)
BUN: 35 mg/dL — ABNORMAL HIGH (ref 6–20)
CHLORIDE: 115 mmol/L — AB (ref 101–111)
CO2: 22 mmol/L (ref 22–32)
Calcium: 8.2 mg/dL — ABNORMAL LOW (ref 8.9–10.3)
Creatinine, Ser: 0.79 mg/dL (ref 0.44–1.00)
GFR calc Af Amer: >60 mL/min (ref >60)
GFR calc non Af Amer: >60 mL/min (ref >60)
Glucose, Bld: 90 mg/dL (ref 65–99)
POTASSIUM: 3.8 mmol/L (ref 3.5–5.1)
SODIUM: 148 mmol/L — AB (ref 135–145)

## 2015-12-28 LAB — CBC WITH DIFFERENTIAL/PLATELET
Basophils Absolute: 0 10*3/uL (ref 0.0–0.1)
Basophils Relative: 0 %
EOS PCT: 0 %
Eosinophils Absolute: 0 10*3/uL (ref 0.0–0.7)
HCT: 34.1 % — ABNORMAL LOW (ref 36.0–46.0)
HEMOGLOBIN: 11 g/dL — AB (ref 12.0–15.0)
LYMPHS PCT: 3 %
Lymphs Abs: 0.8 10*3/uL (ref 0.7–4.0)
MCH: 29.2 pg (ref 26.0–34.0)
MCHC: 32.3 g/dL (ref 30.0–36.0)
MCV: 90.5 fL (ref 78.0–100.0)
MONO ABS: 1.4 10*3/uL — AB (ref 0.1–1.0)
Monocytes Relative: 5 %
NEUTROS PCT: 92 %
Neutro Abs: 25 10*3/uL — ABNORMAL HIGH (ref 1.7–7.7)
Platelets: 415 10*3/uL — ABNORMAL HIGH (ref 150–400)
RBC: 3.77 MIL/uL — AB (ref 3.87–5.11)
RDW: 14.7 % (ref 11.5–15.5)
WBC: 27.2 10*3/uL — AB (ref 4.0–10.5)

## 2015-12-28 LAB — PREPARE FRESH FROZEN PLASMA
BLOOD PRODUCT EXPIRATION DATE: 201712202359
BLOOD PRODUCT EXPIRATION DATE: 201712202359
ISSUE DATE / TIME: 201712151311
ISSUE DATE / TIME: 201712151608
UNIT TYPE AND RH: 6200
Unit Type and Rh: 6200

## 2015-12-28 LAB — GLUCOSE, CAPILLARY: Glucose-Capillary: 78 mg/dL (ref 65–99)

## 2015-12-28 LAB — HEPARIN LEVEL (UNFRACTIONATED): Heparin Unfractionated: 0.52 IU/mL (ref 0.30–0.70)

## 2015-12-28 LAB — MAGNESIUM: MAGNESIUM: 2.4 mg/dL (ref 1.7–2.4)

## 2015-12-28 MED ORDER — HEPARIN BOLUS VIA INFUSION
2000.0000 [IU] | Freq: Once | INTRAVENOUS | Status: AC
  Administered 2015-12-28: 2000 [IU] via INTRAVENOUS
  Filled 2015-12-28: qty 2000

## 2015-12-28 MED ORDER — HEPARIN (PORCINE) IN NACL 100-0.45 UNIT/ML-% IJ SOLN
800.0000 [IU]/h | INTRAMUSCULAR | Status: DC
  Administered 2015-12-28 – 2015-12-31 (×3): 800 [IU]/h via INTRAVENOUS
  Filled 2015-12-28 (×3): qty 250

## 2015-12-28 MED ORDER — KCL-LACTATED RINGERS-D5W 20 MEQ/L IV SOLN
INTRAVENOUS | Status: DC
  Administered 2015-12-28 – 2016-01-02 (×10): via INTRAVENOUS
  Filled 2015-12-28 (×13): qty 1000

## 2015-12-28 NOTE — Progress Notes (Signed)
3 Days Post-Op  Subjective: No complaints  Objective: Vital signs in last 24 hours: Temp:  [98.1 F (36.7 C)-98.3 F (36.8 C)] 98.3 F (36.8 C) (12/18 0503) Pulse Rate:  [65-89] 65 (12/17 2110) Resp:  [16-18] 16 (12/18 0503) BP: (110-152)/(72-84) 152/84 (12/18 0503) SpO2:  [96 %-100 %] 100 % (12/18 0503) Weight:  [57.6 kg (126 lb 15.8 oz)] 57.6 kg (126 lb 15.8 oz) (12/18 0658) Last BM Date: 12/21/15  Intake/Output from previous day: 12/17 0701 - 12/18 0700 In: 1720 [P.O.:480; I.V.:1140; IV Piggyback:100] Out: 855 [Urine:725; Emesis/NG output:90; Drains:15; Stool:25] Intake/Output this shift: Total I/O In: 0  Out: 40 [Drains:40]  Resp: clear to auscultation bilaterally Cardio: regular rate and rhythm GI: soft, mild to mod tenderness. ostomy pink with no output yet  Lab Results:   Recent Labs  12/27/15 0537 12/28/15 0345  WBC 29.1* 27.2*  HGB 11.3* 11.0*  HCT 34.6* 34.1*  PLT 366 415*   BMET  Recent Labs  12/27/15 0537 12/28/15 0345  NA 144 148*  K 3.8 3.8  CL 109 115*  CO2 22 22  GLUCOSE 87 90  BUN 36* 35*  CREATININE 1.03* 0.79  CALCIUM 8.5* 8.2*   PT/INR  Recent Labs  12/25/15 1900 12/26/15 0321  LABPROT 15.3* 15.9*  INR 1.20 1.26   ABG No results for input(s): PHART, HCO3 in the last 72 hours.  Invalid input(s): PCO2, PO2  Studies/Results: No results found.  Anti-infectives: Anti-infectives    Start     Dose/Rate Route Frequency Ordered Stop   12/24/15 1800  piperacillin-tazobactam (ZOSYN) IVPB 3.375 g     3.375 g 12.5 mL/hr over 240 Minutes Intravenous Every 8 hours 12/24/15 1016     12/24/15 1015  piperacillin-tazobactam (ZOSYN) IVPB 3.375 g     3.375 g 100 mL/hr over 30 Minutes Intravenous  Once 12/24/15 1013 12/24/15 1101      Assessment/Plan: s/p Procedure(s): EXPLORATORY LAPAROTOMY, SIGMOID COLECTOMY, COLOSTOMY (N/A) Continue ng and bowel rest until ileus resolves  Continue Zosyn day 3 Dressing changes PT  LOS: 4 days     TOTH III,Anmol Fleck S 12/28/2015

## 2015-12-28 NOTE — Clinical Social Work Placement (Signed)
Patient has a bed at [facility]. CSW has completed FL2 & will continue to follow and assist with discharge when ready.    Lincoln MaxinKelly Nakeesha Bowler, LCSW [facility] Clinical Social Worker cell #: [phone]     CLINICAL SOCIAL WORK PLACEMENT  NOTE  Date:  12/28/2015  Patient Details  Name: Candace Fowlerlva Cain MRN: [id number] Date of Birth: 05-04-1920  Clinical Social Work is seeking post-discharge placement for this patient at the Skilled  Nursing Facility level of care (*CSW will initial, date and re-position this form in  chart as items are completed):  Yes   Patient/family provided with [facility] Clinical Social Work Department's list of facilities offering this level of care within the geographic area requested by the patient (or if unable, by the patient's family).  Yes   Patient/family informed of their freedom to choose among providers that offer the needed level of care, that participate in Medicare, Medicaid or managed care program needed by the patient, have an available bed and are willing to accept the patient.  Yes   Patient/family informed of [facility]'s ownership interest in [facility] and [facility], as well as of the fact that they are under no obligation to receive care at these facilities.  PASRR submitted to EDS on 12/28/15     PASRR number received on 12/28/15     Existing PASRR number confirmed on       FL2 transmitted to all facilities in geographic area requested by pt/family on 12/28/15     FL2 transmitted to all facilities within larger geographic area on       Patient informed that his/her managed care company has contracts with or will negotiate with certain facilities, including the following:        Yes   Patient/family informed of bed offers received.  [facility]     Physician recommends and patient chooses bed at      [facility]  .  Patient to be transferred  to facility by       Patient family notified on   of transfer.  Name of family member notified:        PHYSICIAN       Additional Comment:    _______________________________________________ Arlyss RepressHarrison, Gerhardt Gleed F, LCSW 12/28/2015, 3:08 PM

## 2015-12-28 NOTE — Progress Notes (Signed)
PHARMACIST - PHYSICIAN COMMUNICATION CONCERNING:  IV heparin  89 yoF on IV heparin for Afib.  Please see note written by Arley PhenixEllen Jackson, PharmD earlier today for more details.    1st heparin level tonight is therapeutic @ 0.52 (goal 0.3-0.7).  No issues per RN.     RECOMMENDATION: Continue IV heparin at current rate (800 units/hr = 8 ml/hr).  Check confirmation HL in 8 hours  Haynes Hoehnolleen Shaneta Cervenka, PharmD, BCPS 12/28/2015, 9:48 PM  Pager: [phone]

## 2015-12-28 NOTE — Progress Notes (Signed)
PROGRESS NOTE  Candace Cain ZOX:[id number] DOB: 08-30-1920 DOA: 12/24/2015 PCP: Duane LopeAlan Ross, MD  HPI/Recap of past 24 hours:  Had brief tachycardia yesterday, better with ivf and iv lopressor Post op day three, on NG suction,  She report one episode of nose bleed, RN report no bleeding from wound, hgb stable Daughter in room   Assessment/Plan: Principal Problem:   Diverticulitis of colon with perforation s/p colectomy/colostomy 12/25/2015 Active Problems:   Essential hypertension   Atrial fibrillation (HCC)   Hypothyroidism   Small bowel obstruction   Protein-calorie malnutrition, severe   Perforated bowel (HCC)   Diverticulitis of large intestine with abscess without bleeding   Hypokalemia   Perforated sigmoid diverticulitis with peritonitis and small bowel obstruction s/p Exploratory laparotomy with sigmoid colectomy and creation of colostomy on 12/15 Patient report bleeding at surgical site, hgb stable, will continue hold anticoagulation. Npo, On ng suction, drain in place, continue iv abx/ ivf,  general surgery following daily   paroxysmal Atrial fibrillation with h/o bradycardia she is not on any rate or rhythm control agent at home. Patient on coumadin at home. INR of 2.96 on admission.   -reverse coumadin with vitamin K 2mg  and  ffp prior to surgery on 12/15,Heparin drip when ok with general surgery Post op day one on 12/16, patient heart rate increased with intermittent afib, started on low dose iv lopressor, close monitor heart rate., keep mag>2, k>4 Start heparin drip on 12/18, now that hgb stable, no bleed from wound   Hypokalemia: replace k,  Hypothyroidism -synthroid 12.5mcg IV daily  Hypertension -bp stable on low dose iv lopressor -hydralazine prn  Severe malnutrition in context of chronic illness  baseline, independent, lives by herself, still drives and cooks  DVT prophylaxis: SCDs Code Status: DNR Family Communication: patient and daughter  in room Disposition Plan: SNF Consults called:  General surgery Critical care  Procedures:  s/p Exploratory laparotomy with sigmoid colectomy and creation of colostomy on 12/15 by Dr. Harriette Bouillonhomas Cornett.  Antibiotics:  Zosyn from admission   Objective: BP (!) 152/84 (BP Location: Right Arm)   Pulse 65   Temp 98.3 F (36.8 C) (Oral)   Resp 16   Ht 5' 1.5" (1.562 m)   Wt 57.6 kg (126 lb 15.8 oz)   SpO2 100%   BMI 23.61 kg/m   Intake/Output Summary (Last 24 hours) at 12/28/15 0850 Last data filed at 12/28/15 [id number]  Gross per 24 hour  Intake             1720 ml  Output              855 ml  Net              865 ml   Filed Weights   12/26/15 0500 12/27/15 0607 12/28/15 0658  Weight: 59 kg (130 lb 1.1 oz) 57.5 kg (126 lb 12.2 oz) 57.6 kg (126 lb 15.8 oz)    Exam:   General:  Look younger than stated age, no NG suction, NAD, aaox3, wearing hearing aids, heard of hearing, very pleasant  Cardiovascular: RRR with ectopic atrial beats  Respiratory: CTABL  Abdomen: post op changes, +colostomy, + drain  Musculoskeletal: No Edema  Neuro: aaox3  Data Reviewed: Basic Metabolic Panel:  Recent Labs Lab 12/24/15 0850 12/25/15 0358 12/26/15 0321 12/27/15 0537 12/28/15 0345  NA 135 134* 139 144 148*  K 4.0 3.4* 3.5 3.8 3.8  CL 96* 101 105 109 115*  CO2 24 24 24 22  22  GLUCOSE 109* 100* 111* 87 90  BUN 34* 27* 29* 36* 35*  CREATININE 1.20* 0.88 1.01* 1.03* 0.79  CALCIUM 9.3 8.5* 8.3* 8.5* 8.2*  MG  --   --  1.9  --  2.4   Liver Function Tests:  Recent Labs Lab 12/24/15 0850  AST 24  ALT 16  ALKPHOS 106  BILITOT 0.9  PROT 7.2  ALBUMIN 3.7    Recent Labs Lab 12/24/15 0850  LIPASE 19   No results for input(s): AMMONIA in the last 168 hours. CBC:  Recent Labs Lab 12/24/15 0850 12/25/15 0358 12/26/15 0321 12/27/15 0537 12/28/15 0345  WBC 31.7* 21.6* 27.2* 29.1* 27.2*  NEUTROABS  --   --  25.9*  --  25.0*  HGB 13.9 11.9* 12.1 11.3* 11.0*  HCT  41.2 36.4 36.5 34.6* 34.1*  MCV 89.0 88.6 88.2 89.4 90.5  PLT 338 383 341 366 415*   Cardiac Enzymes:   No results for input(s): CKTOTAL, CKMB, CKMBINDEX, TROPONINI in the last 168 hours. BNP (last 3 results) No results for input(s): BNP in the last 8760 hours.  ProBNP (last 3 results) No results for input(s): PROBNP in the last 8760 hours.  CBG:  Recent Labs Lab 12/28/15 0746  GLUCAP 78    Recent Results (from the past 240 hour(s))  Culture, Urine     Status: Abnormal   Collection Time: 12/24/15  8:50 AM  Result Value Ref Range Status   Specimen Description URINE, RANDOM  Final   Special Requests NONE  Final   Culture MULTIPLE SPECIES PRESENT, SUGGEST RECOLLECTION (A)  Final   Report Status 12/26/2015 FINAL  Final  Blood culture (routine x 2)     Status: None (Preliminary result)   Collection Time: 12/24/15  9:56 AM  Result Value Ref Range Status   Specimen Description BLOOD LEFT HAND  Final   Special Requests IN PEDIATRIC BOTTLE  Final   Culture   Final    NO GROWTH 3 DAYS Performed at [facility]    Report Status PENDING  Incomplete  Blood culture (routine x 2)     Status: None (Preliminary result)   Collection Time: 12/24/15 10:15 AM  Result Value Ref Range Status   Specimen Description BLOOD LEFT ANTECUBITAL  Final   Special Requests BOTTLES DRAWN AEROBIC AND ANAEROBIC  Final   Culture   Final    NO GROWTH 3 DAYS Performed at [facility]    Report Status PENDING  Incomplete  MRSA PCR Screening     Status: None   Collection Time: 12/25/15 10:30 PM  Result Value Ref Range Status   MRSA by PCR NEGATIVE NEGATIVE Final    Comment:        The GeneXpert MRSA Assay (FDA approved for NASAL specimens only), is one component of a comprehensive MRSA colonization surveillance program. It is not intended to diagnose MRSA infection nor to guide or monitor treatment for MRSA infections.      Studies: No results found.  Scheduled  Meds: . heparin  5,000 Units Subcutaneous Q8H  . levothyroxine  12.5 mcg Intravenous QAC breakfast  . metoprolol  2.5 mg Intravenous Q12H  . pantoprazole (PROTONIX) IV  40 mg Intravenous Q24H  . piperacillin-tazobactam (ZOSYN)  IV  3.375 g Intravenous Q8H    Continuous Infusions: . dextrose 5% lactated ringers with KCl 20 mEq/L       Time spent:  Keyonna Comunale MD, PhD  [facility] Pager [phone]. If 7PM-7AM,  please contact night-coverage at [email or web address], password [email or web address] 12/28/2015, 8:50 AM  LOS: 4 days

## 2015-12-28 NOTE — Care Management Important Message (Signed)
Important Message  Patient Details  Name: Candace Cain MRN: [id number] Date of Birth: 06-11-1920   Medicare Important Message Given:  Yes    Caren MacadamFuller, Mahalia Dykes 12/28/2015, 11:42 AMImportant Message  Patient Details  Name: Candace Cain MRN: [id number] Date of Birth: 06-11-1920   Medicare Important Message Given:  Yes    Caren MacadamFuller, Rafik Koppel 12/28/2015, 11:41 AM

## 2015-12-28 NOTE — NC FL2 (Signed)
[place] MEDICAID FL2 LEVEL OF CARE SCREENING TOOL     IDENTIFICATION  Patient Name: Candace Cain Birthdate: February 24, 1920 Sex: female Admission Date (Current Location): 12/24/2015  [facility] [place]   Facility and Address:  [facility],  [address], [place] [id number]      Provider Number: [id number]  Attending Physician Name and Address:  Albertine GratesFang Tuesday Terlecki, MD  Relative Name and Phone Number:       Current Level of Care: Hospital Recommended Level of Care: Skilled Nursing Facility Prior Approval Number:    Date Approved/Denied:   PASRR Number: [id number]  Discharge Plan: Home    Current Diagnoses: Patient Active Problem List   Diagnosis Date Noted  . Diverticulitis of large intestine with abscess without bleeding   . Hypokalemia   . Protein-calorie malnutrition, severe 12/25/2015  . Perforated bowel (HCC) 12/25/2015  . Small bowel obstruction 12/24/2015  . Diverticulitis of colon with perforation s/p colectomy/colostomy 12/25/2015 12/24/2015  . Closed right hip fracture (HCC) 04/26/2014  . Hip fracture requiring operative repair (HCC) 04/25/2014  . Lobar pneumonia due to unspecified organism 03/28/2014  . Dehydration, moderate 03/28/2014  . Hypothyroidism 03/28/2014  . GERD (gastroesophageal reflux disease) 03/28/2014  . Depression 03/28/2014  . Community acquired pneumonia 03/28/2014  . Hemoptysis   . Hyponatremia 03/27/2014  . Long term (current) use of anticoagulants 04/21/2011  . CHEST PAIN 04/22/2008  . Essential hypertension 04/19/2008  . Atrial fibrillation (HCC) 04/19/2008  . BRADYCARDIA 04/19/2008    Orientation RESPIRATION BLADDER Height & Weight     Self, Time, Situation, Place  Normal Continent Weight: 126 lb 15.8 oz (57.6 kg) Height:  5' 1.5" (156.2 cm)  BEHAVIORAL SYMPTOMS/MOOD NEUROLOGICAL BOWEL NUTRITION STATUS      Colostomy Diet  AMBULATORY STATUS COMMUNICATION OF NEEDS Skin   Extensive Assist Verbally  Surgical wounds (Incision (Closed) 12/25/15 Abdomen)                       Personal Care Assistance Level of Assistance  Bathing, Dressing Bathing Assistance: Limited assistance   Dressing Assistance: Limited assistance     Functional Limitations Info             SPECIAL CARE FACTORS FREQUENCY  PT (By licensed PT), OT (By licensed OT)     PT Frequency: 5 OT Frequency: 5            Contractures      Additional Factors Info  Code Status, Allergies Code Status Info: DNR Allergies Info: NKDA           Current Medications (12/28/2015):  This is the current hospital active medication list Current Facility-Administered Medications  Medication Dose Route Frequency Provider Last Rate Last Dose  . 0.9 %  sodium chloride infusion  250 mL Intravenous PRN Jamie KatoAaron Trimble, MD      . dextrose 5% in lactated ringers with KCl 20 mEq/L infusion   Intravenous Continuous Albertine GratesFang Marticia Reifschneider, MD 75 mL/hr at 12/28/15 0948    . fentaNYL (SUBLIMAZE) injection 25-100 mcg  25-100 mcg Intravenous Q2H PRN Kalman ShanMurali Ramaswamy, MD   50 mcg at 12/27/15 2112  . heparin injection 5,000 Units  5,000 Units Subcutaneous Q8H Jamie KatoAaron Trimble, MD   5,000 Units at 12/28/15 0505  . hydrALAZINE (APRESOLINE) injection 5 mg  5 mg Intravenous Q6H PRN Narda Bondsalph A Nettey, MD   5 mg at 12/26/15 0036  . HYDROmorphone (DILAUDID) injection 1 mg  1 mg Intravenous Q2H PRN Maisie Fushomas  Cornett, MD   1 mg at 12/28/15 0456  . levothyroxine (SYNTHROID, LEVOTHROID) injection 12.5 mcg  12.5 mcg Intravenous QAC breakfast Narda Bondsalph A Nettey, MD   12.5 mcg at 12/28/15 0948  . methocarbamol (ROBAXIN) 500 mg in dextrose 5 % 50 mL IVPB  500 mg Intravenous Q8H PRN Edson SnowballBrooke A Miller, PA-C      . metoprolol (LOPRESSOR) injection 2.5 mg  2.5 mg Intravenous Q12H Albertine GratesFang Lorrene Graef, MD   2.5 mg at 12/28/15 0948  . morphine 4 MG/ML injection 4 mg  4 mg Intravenous Q3H PRN Edson SnowballBrooke A Miller, PA-C   4 mg at 12/27/15 1850  . ondansetron (ZOFRAN) injection 4 mg  4 mg Intravenous Q6H  PRN Edson SnowballBrooke A Miller, PA-C   4 mg at 12/27/15 0437  . pantoprazole (PROTONIX) injection 40 mg  40 mg Intravenous Q24H Harriette Bouillonhomas Cornett, MD   40 mg at 12/28/15 0948  . phenol (CHLORASEPTIC) mouth spray 1 spray  1 spray Mouth/Throat PRN Narda Bondsalph A Nettey, MD      . piperacillin-tazobactam (ZOSYN) IVPB 3.375 g  3.375 g Intravenous Q8H Nikola Glogovac, RPH   3.375 g at 12/28/15 [id number]     Discharge Medications: Please see discharge summary for a list of discharge medications.  Relevant Imaging Results:  Relevant Lab Results:   Additional Information SSN: [id number]  Arlyss RepressHarrison, Kelly F, LCSW

## 2015-12-28 NOTE — Clinical Social Work Note (Signed)
Clinical Social Work Assessment  Patient Details  Name: Candace Cain MRN: [id number] Date of Birth: 1920/03/27  Date of referral:  12/28/15               Reason for consult:  Facility Placement                Permission sought to share information with:  Facility Industrial/product designerContact Representative Permission granted to share information::  Yes, Verbal Permission Granted  Name::        Agency::     Relationship::     Contact Information:     Housing/Transportation Living arrangements for the past 2 months:  Independent Living Facility Source of Information:  Patient Patient Interpreter Needed:  None Criminal Activity/Legal Involvement Pertinent to Current Situation/Hospitalization:  No - Comment as needed Significant Relationships:  Adult Children, Other Family Members Lives with:  Self Do you feel safe going back to the place where you live?  No Need for family participation in patient care:  No (Coment)  Care giving concerns:  CSW reviewed PT evaluation recommending SNF at discharge.    Social Worker assessment / plan:  CSW spoke with patient who informed CSW that she was living in the [facility] Living [facility] at [facility] but [facility] at [facility]. CSW confirmed with [facility] End[facility]Kelly [facility] that they would have a bed for her when ready.   Employment status:  Retired Database administratornsurance information:  Managed Medicare PT Recommendations:  Skilled Nursing Facility Information / Referral to community resources:  Skilled Nursing Facility  Patient/Family's Response to care:    Patient/Family's Understanding of and Emotional Response to Diagnosis, Current Treatment, and Prognosis:    Emotional Assessment Appearance:  Appears stated age Attitude/Demeanor/Rapport:    Affect (typically observed):    Orientation:  Oriented to Self, Oriented to Place, Oriented to  Time, Oriented to Situation Alcohol / Substance use:    Psych involvement (Current and /or in the  community):     Discharge Needs  Concerns to be addressed:    Readmission within the last 30 days:    Current discharge risk:    Barriers to Discharge:      Arlyss RepressHarrison, Mykaylah Ballman F, LCSW 12/28/2015, 3:07 PM

## 2015-12-28 NOTE — Evaluation (Signed)
Physical Therapy Evaluation Patient Details Name: Candace Cain Bodnar MRN: [id number] DOB: Jan 15, 1920 Today's Date: 12/28/2015   History of Present Illness  [name] is a 82 y/o woman with a hx of PAF on coumadin who presented with abdominal pain and perforated diverticulitis who underwent ex-lap with repair and ostomy.She  has a past medical history of Alopecia; Atrial fibrillation (HCC); Bradycardia; and Unspecified essential hypertension., and R hip hemiarthroplasty.  Clinical Impression  Pt admitted with above diagnosis. Pt currently with functional limitations due to the deficits listed below (see PT Problem List). Pt will benefit from skilled PT to increase their independence and safety with mobility to allow discharge to the venue listed below.  Pt [facility] and recommend d/c to their skilled portion for short term rehab prior to returning to her cottage. Daughter and pt in agreement.     Follow Up Recommendations SNF [facility])    Equipment Recommendations  None recommended by PT    Recommendations for Other Services       Precautions / Restrictions Precautions Precautions: Fall Precaution Comments: NG tube, colostomy Restrictions Weight Bearing Restrictions: No      Mobility  Bed Mobility Overal bed mobility: Needs Assistance Bed Mobility: Rolling;Sidelying to Sit Rolling: Min assist Sidelying to sit: Min assist       General bed mobility comments: MIN A for technique and to get trunk upright. Denies dizziness with sitting, but states she feels foggy. Reports just feeling weak  Transfers Overall transfer level: Needs assistance Equipment used: Rolling walker (2 wheeled) Transfers: Sit to/from [employer] CorporationStand;Stand Pivot Transfers Sit to Stand: Min assist;From elevated surface Stand pivot transfers: Min assist       General transfer comment: MIN A to power up and shuffeled steps with SPT with RW  Ambulation/Gait              General Gait Details: SPT only  Stairs            Wheelchair Mobility    Modified Rankin (Stroke Patients Only)       Balance Overall balance assessment: Needs assistance Sitting-balance support: Feet supported Sitting balance-Leahy Scale: Fair     Standing balance support: Bilateral upper extremity supported Standing balance-Leahy Scale: Poor                               Pertinent Vitals/Pain Pain Assessment: No/denies pain    Home Living Family/patient expects to be discharged to:: Skilled nursing facility ([facility]) Living Arrangements: Alone     Home Access: Stairs to enter   Secretary/administratorntrance Stairs-Number of Steps: 1 Home Layout: One level Home Equipment: Cane - single point;Walker - 2 wheels;Wheelchair - manual      Prior Function Level of Independence: Independent with assistive device(s)         Comments: Amb with cane outside her apartment, but states recently needed it more due to feeling poorly     Hand Dominance        Extremity/Trunk Assessment   Upper Extremity Assessment Upper Extremity Assessment: Generalized weakness    Lower Extremity Assessment Lower Extremity Assessment: Generalized weakness       Communication   Communication: HOH  Cognition Arousal/Alertness: Awake/alert Behavior During Therapy: WFL for tasks assessed/performed Overall Cognitive Status: Within Functional Limits for tasks assessed  General Comments      Exercises     Assessment/Plan    PT Assessment Patient needs continued PT services  PT Problem List Decreased strength;Decreased activity tolerance;Decreased balance;Decreased mobility          PT Treatment Interventions DME instruction;Gait training;Functional mobility training;Therapeutic activities;Therapeutic exercise;Balance training;Patient/family education    PT Goals (Current goals can be found in the Care Plan  section)  Acute Rehab PT Goals Patient Stated Goal: To go to rehab portion of [facility] prior to [facility] to [facility] PT Goal Formulation: With family Time For Goal Achievement: 01/04/16 Potential to Achieve Goals: Good    Frequency Min 3X/week   Barriers to discharge Decreased caregiver support      Co-evaluation               End of Session   Activity Tolerance: Patient limited by fatigue Patient left: in chair;with call bell/phone within reach;with family/visitor present Nurse Communication: Mobility status         Time: [id number] PT Time Calculation (min) (ACUTE ONLY): 30 min   Charges:   PT Evaluation $PT Eval Moderate Complexity: 1 Procedure PT Treatments $Therapeutic Activity: 8-22 mins   PT G Codes:        Odie Edmonds LUBECK 12/28/2015, 10:53 AM

## 2015-12-28 NOTE — Progress Notes (Signed)
ANTICOAGULATION CONSULT NOTE - Initial Consult  Pharmacy Consult for heparin Indication: atrial fibrillation  No Known Allergies  Patient Measurements: Height: 5' 1.5" (156.2 cm) Weight: 126 lb 15.8 oz (57.6 kg) IBW/kg (Calculated) : 48.95 Heparin Dosing Weight: 57.6kg  Vital Signs: Temp: 98.3 F (36.8 C) (12/18 0503) Temp Source: Oral (12/18 0503) BP: 152/84 (12/18 0503)  Labs:  Recent Labs  12/25/15 1900  12/26/15 0321 12/27/15 0537 12/28/15 0345  HGB  --   < > 12.1 11.3* 11.0*  HCT  --   --  36.5 34.6* 34.1*  PLT  --   --  341 366 415*  LABPROT 15.3*  --  15.9*  --   --   INR 1.20  --  1.26  --   --   CREATININE  --   --  1.01* 1.03* 0.79  < > = values in this interval not displayed.  Estimated Creatinine Clearance: 34 mL/min (by C-G formula based on SCr of 0.79 mg/dL).   Medical History: Past Medical History:  Diagnosis Date  . Alopecia   . Atrial fibrillation (HCC)   . Bradycardia   . Unspecified essential hypertension      Assessment: 82 yo female admitted with Diverticulitis of colon with perforation s/p colectomy/colostomy 12/25/2015.  PTA warfarin for paroxysmal A Fib, INR supratherapeutic on admission, IV vitamin K and PRBC's given on 12/15. Pharmacy consulted to dose heparin drip for AFib.  12/16 PT 15.9, INR 1.26 H/H slightly low No bleeding from wound per RN SQ heparin given at 0500 today  Goal of Therapy:  Heparin level 0.3-0.7 units/ml  Monitor platelets by anticoagulation protocol  Plan:  Heparin bolus 2000 units x 1 then Heparin drip at 800 units/hr Heparin level in 8 hours Daily CBC  Arley PhenixEllen Jourdin Gens RPh 12/28/2015, 12:18 PM Pager [phone]

## 2015-12-29 DIAGNOSIS — Z7901 Long term (current) use of anticoagulants: Secondary | ICD-10-CM

## 2015-12-29 LAB — CBC WITH DIFFERENTIAL/PLATELET
Basophils Absolute: 0 10*3/uL (ref 0.0–0.1)
Basophils Relative: 0 %
EOS ABS: 0.2 10*3/uL (ref 0.0–0.7)
EOS PCT: 2 %
HCT: 35.9 % — ABNORMAL LOW (ref 36.0–46.0)
Hemoglobin: 11.6 g/dL — ABNORMAL LOW (ref 12.0–15.0)
LYMPHS ABS: 0.8 10*3/uL (ref 0.7–4.0)
Lymphocytes Relative: 5 %
MCH: 29.2 pg (ref 26.0–34.0)
MCHC: 32.3 g/dL (ref 30.0–36.0)
MCV: 90.4 fL (ref 78.0–100.0)
MONO ABS: 1 10*3/uL (ref 0.1–1.0)
Monocytes Relative: 6 %
Neutro Abs: 14.2 10*3/uL — ABNORMAL HIGH (ref 1.7–7.7)
Neutrophils Relative %: 87 %
PLATELETS: 381 10*3/uL (ref 150–400)
RBC: 3.97 MIL/uL (ref 3.87–5.11)
RDW: 14.8 % (ref 11.5–15.5)
WBC: 16.3 10*3/uL — ABNORMAL HIGH (ref 4.0–10.5)

## 2015-12-29 LAB — BASIC METABOLIC PANEL
Anion gap: 6 (ref 5–15)
BUN: 28 mg/dL — AB (ref 6–20)
CALCIUM: 8.3 mg/dL — AB (ref 8.9–10.3)
CO2: 25 mmol/L (ref 22–32)
CREATININE: 0.83 mg/dL (ref 0.44–1.00)
Chloride: 118 mmol/L — ABNORMAL HIGH (ref 101–111)
GFR calc Af Amer: >60 mL/min (ref >60)
GFR, EST NON AFRICAN AMERICAN: 59 mL/min — AB (ref >60)
GLUCOSE: 160 mg/dL — AB (ref 65–99)
Potassium: 4.1 mmol/L (ref 3.5–5.1)
SODIUM: 149 mmol/L — AB (ref 135–145)

## 2015-12-29 LAB — CULTURE, BLOOD (ROUTINE X 2)
Culture: NO GROWTH
Culture: NO GROWTH

## 2015-12-29 LAB — HEPARIN LEVEL (UNFRACTIONATED): HEPARIN UNFRACTIONATED: 0.37 [IU]/mL (ref 0.30–0.70)

## 2015-12-29 MED ORDER — ACETAMINOPHEN 10 MG/ML IV SOLN
1000.0000 mg | Freq: Four times a day (QID) | INTRAVENOUS | Status: AC
  Administered 2015-12-29 – 2015-12-30 (×4): 1000 mg via INTRAVENOUS
  Filled 2015-12-29 (×4): qty 100

## 2015-12-29 NOTE — Progress Notes (Signed)
ANTICOAGULATION CONSULT NOTE -   Pharmacy Consult for heparin Indication: atrial fibrillation  No Known Allergies  Patient Measurements: Height: 5' 1.5" (156.2 cm) Weight: 135 lb 5.8 oz (61.4 kg) IBW/kg (Calculated) : 48.95 Heparin Dosing Weight: 57.6kg  Vital Signs: Temp: 98.5 F (36.9 C) (12/19 0537) Temp Source: Axillary (12/19 0537) BP: 166/80 (12/19 0537) Pulse Rate: 71 (12/19 0537)  Labs:  Recent Labs  12/27/15 0537 12/28/15 0345 12/28/15 2106 12/29/15 0449  HGB 11.3* 11.0*  --  11.6*  HCT 34.6* 34.1*  --  35.9*  PLT 366 415*  --  381  HEPARINUNFRC  --   --  0.52 0.37  CREATININE 1.03* 0.79  --  0.83    Estimated Creatinine Clearance: 36.1 mL/min (by C-G formula based on SCr of 0.83 mg/dL).   Medical History: Past Medical History:  Diagnosis Date  . Alopecia   . Atrial fibrillation (HCC)   . Bradycardia   . Unspecified essential hypertension      Assessment: 82 yo female admitted with Diverticulitis of colon with perforation s/p colectomy/colostomy 12/25/2015.  PTA warfarin for paroxysmal A Fib, INR supratherapeutic on admission, IV vitamin K and PRBC's given on 12/15. Pharmacy consulted to dose heparin drip for AFib.  12/29/2015 HL=therapeutic H/H slightly low but stable No bleeding per RN   Goal of Therapy:  Heparin level 0.3-0.7 units/ml  Monitor platelets by anticoagulation protocol  Plan:  Continue Heparin drip at 800 units/hr Daily CBC and heparin level F/u when to resume oral anticoagulation   Arley PhenixEllen Eudelia Hiltunen RPh 12/29/2015, 7:38 AM Pager [phone]

## 2015-12-29 NOTE — Progress Notes (Signed)
Nutrition Follow-up  DOCUMENTATION CODES:   Severe malnutrition in context of chronic illness  INTERVENTION:  - Diet advancement as medically feasible. - If unable to advance diet in 24-48 hours, need to consider nutrition support. - RD will follow-up 12/21.   NUTRITION DIAGNOSIS:   Inadequate oral intake related to inability to eat as evidenced by NPO status. -ongoing  GOAL:   Patient will meet greater than or equal to 90% of their needs -unable to meet.  MONITOR:   Diet advancement, Weight trends, Labs, Skin, I & O's, Other (Comment) (Need for nutrition support)  ASSESSMENT:   82 year old female with medical history significant for atrial fibrillation, hypertension, hypothyroidism who presents with 1 week of abdominal pain, constipation, nausea. Patient found to have acute small bowel obstruction with mesenteric edema suspicious for volvulus and acute sigmoid colon diverticulitis with possible abscesses.  12/19 Pt is POD #4 ex lap with sigmoid colectomy and colostomy. Pt has been NPO since 12/15. Weight has fluctuated some since time of surgery but weight today and weight 12/15 are consistent with each other. Pt with NGT to suction. Still no stool from new ostomy.   Medications reviewed; 12.5 mcg IV Synthroid/day, PRN IV Zofran, 40 mg IV Protonix/day.  Labs reviewed; Na: 149 mmol/L, Cl: 188 mmol/L, BUN: 28 mg/dL, Ca: 8.3 mg/dL.  IVF: D5-LR-20 mEq KCl @ 75 mL/hr (306 kcal).     12/15 - Per Surgery note, patient will decide if she wants to pursue exploratory laparotomy with possible small and large bowel resection and possible colostomy or if she will want to remain comfortable and continue medical treatment only.  - She endorsed she has had nausea, abdominal pain, and constipation for the past week.  - She also endorses abdominal distension.  - She is now day 7 without any food as she could not tolerate food.  - Prior to onset of symptoms, she reports eating 3 light meals  daily.  - Patient with NG tube to low intermittent suction.  - On assessment noted output of approximately 650 ml. - Patient reports UBW of 120 lbs and that she believes she has been gaining weight with her distention.  - Limited weight history in chart to confirm, but patient was 129 lbs on 10/20/2015.  - Nutrition-Focused physical exam completed.  - Findings are moderate-severe fat depletion, moderate-severe muscle depletion, and no edema.   Patient meets criteria for severe chronic malnutrition in setting of severe fat depletion, severe muscle depletion.   Diet Order:  Diet NPO time specified  Skin:  Wound (see comment) (Abdominal surgical wound from 12/15)  Last BM:  Unknown  Height:   Ht Readings from Last 1 Encounters:  12/24/15 5' 1.5" (1.562 m)    Weight:   Wt Readings from Last 1 Encounters:  12/29/15 135 lb 5.8 oz (61.4 kg)    Ideal Body Weight:  48.9 kg  BMI:  Body mass index is 25.16 kg/m.  Estimated Nutritional Needs:   Kcal:  1425-1641 (MSJ x 1.3-1.5)  Protein:  80-92 grams (1.4-1.6 grams/kg)  Fluid:  >/= 1.4 L/day (25 ml/kg)  EDUCATION NEEDS:   No education needs identified at this time    Trenton GammonJessica Elenor Wildes, MS, RD, LDN, CNSC Inpatient Clinical Dietitian Pager # [phone] After hours/weekend pager # [phone]

## 2015-12-29 NOTE — Progress Notes (Signed)
4 Days Post-Op  Subjective: Making slow progress.  Some dark stool like fluid in the ostomy and some gas.  Open wound looks good, drain is clear and serous colored fluid.  Objective: Vital signs in last 24 hours: Temp:  [97.6 F (36.4 C)-98.5 F (36.9 C)] 98.5 F (36.9 C) (12/19 0537) Pulse Rate:  [60-71] 71 (12/19 0537) Resp:  [16] 16 (12/19 0537) BP: (158-166)/(74-82) 166/80 (12/19 0537) SpO2:  [93 %-94 %] 94 % (12/19 0537) Weight:  [61.4 kg (135 lb 5.8 oz)] 61.4 kg (135 lb 5.8 oz) (12/19 0537) Last BM Date: 12/21/15 240 PO 1600 IV fluids Urine 625 recorded NG 50 Drain 190 Afebrile, VSS Na 149, K+ 4.1, Glucose 160 WBC trending down  Admit urine TNTC WBC Telem:  SR with some PAC's Intake/Output from previous day: 12/18 0701 - 12/19 0700 In: 2042.1 [P.O.:240; I.V.:1652.1; IV Piggyback:150] Out: 865 [Urine:625; Emesis/NG output:50; Drains:190] Intake/Output this shift: Total I/O In: 350 [I.V.:300; IV Piggyback:50] Out: 540 [Urine:350; Emesis/NG output:50; Drains:140]  General appearance: alert, cooperative and no distress Resp: clear to auscultation bilaterally GI: soft, pretty sore, wound OK, drain clear and serous, Ostomy with some edema, dark brown fluid from the Ostomy.  some flatus also  Lab Results:   Recent Labs  12/28/15 0345 12/29/15 0449  WBC 27.2* 16.3*  HGB 11.0* 11.6*  HCT 34.1* 35.9*  PLT 415* 381    BMET  Recent Labs  12/28/15 0345 12/29/15 0449  NA 148* 149*  K 3.8 4.1  CL 115* 118*  CO2 22 25  GLUCOSE 90 160*  BUN 35* 28*  CREATININE 0.79 0.83  CALCIUM 8.2* 8.3*   PT/INR No results for input(s): LABPROT, INR in the last 72 hours.   Recent Labs Lab 12/24/15 0850  AST 24  ALT 16  ALKPHOS 106  BILITOT 0.9  PROT 7.2  ALBUMIN 3.7     Lipase     Component Value Date/Time   LIPASE 19 12/24/2015 0850     Studies/Results: No results found. Prior to Admission medications   Medication Sig Start Date End Date Taking?  Authorizing Provider  amLODipine (NORVASC) 5 MG tablet Take 5 mg by mouth daily.    Yes Historical Provider, MD  b complex vitamins capsule Take 1 capsule by mouth daily.   Yes Historical Provider, MD  Calcium Carbonate-Vitamin D (TH CALCIUM CARBONATE-VITAMIN D) 600-400 MG-UNIT per tablet Take 2 tablets by mouth daily.     Yes Historical Provider, MD  levothyroxine (SYNTHROID, LEVOTHROID) 25 MCG tablet Take 25 mcg by mouth daily before breakfast.   Yes Historical Provider, MD  losartan (COZAAR) 100 MG tablet Take 100 mg by mouth daily.   Yes Historical Provider, MD  vitamin B-12 (CYANOCOBALAMIN) 1000 MCG tablet Take 1,000 mcg by mouth daily.   Yes Historical Provider, MD  warfarin (COUMADIN) 2.5 MG tablet Take 2.5 mg by mouth every evening.   Yes Historical Provider, MD  warfarin (COUMADIN) 3 MG tablet Take 1 tablet (3 mg total) by mouth daily. Patient not taking: Reported on 12/24/2015 04/29/14   Jeralyn BennettEzequiel Zamora, MD    Medications: . levothyroxine  12.5 mcg Intravenous QAC breakfast  . metoprolol  2.5 mg Intravenous Q12H  . pantoprazole (PROTONIX) IV  40 mg Intravenous Q24H  . piperacillin-tazobactam (ZOSYN)  IV  3.375 g Intravenous Q8H   . dextrose 5% lactated ringers with KCl 20 mEq/L 75 mL/hr at 12/29/15 1148  . heparin 800 Units/hr (12/28/15 1252)   Colon, segmental resection, Sigmoid -  BENIGN COLORECTAL MUCOSA WITH DIVERTICULITIS AND ACUTE SEROSITIS. - TWO BENIGN LYMPH NODES (0/2). - NO DYSPLASIA OR TUMOR SEEN. Assessment/Plan Perforated sigmoid diverticulitis with peritonitis and small bowel obstruction S/p Exploratory laparotomy with sigmoid colectomy and creation of colostomy, pelvic drain placement, 12/25/15, Dr. Luisa Hartornett , POD 4 Atrial fibrillation on coumadin at home Hypertension Hypothyroid  Hx of tobacco use FEN:  IV fluids/NPO ID: Day 6 Zosyn DVT: Heparin drip  Plan:  Continue NG but clamping trials, hopefully we can take out tomorrow, ice chips with some clear  flavors, ambulate, IV tylenol.    LOS: 5 days    Lanita Stammen 12/29/2015 [phone]

## 2015-12-29 NOTE — Progress Notes (Signed)
PROGRESS NOTE  Candace Cain ZOX:[id number] DOB: Oct 28, 1920 DOA: 12/24/2015 PCP: Duane Lope, MD  Brief Summary:  Lovely independent 82yo from home with hx of afib on coumadin, hypertension. Here with Perforated sigmoid diverticulitis with peritonitis and small bowel obstruction . S/p ex lap with colostomy  And drain on 12/15  Improving, on heparin drip, transition to coumadin once able to take po.   HPI/Recap of past 24 hours:  Post op day 4, doing well, on ng clamping trial today,    Assessment/Plan: Principal Problem:   Diverticulitis of colon with perforation s/p colectomy/colostomy 12/25/2015 Active Problems:   Essential hypertension   Atrial fibrillation (HCC)   Hypothyroidism   Small bowel obstruction   Protein-calorie malnutrition, severe   Perforated bowel (HCC)   Diverticulitis of large intestine with abscess without bleeding   Hypokalemia   Perforated sigmoid diverticulitis with peritonitis and small bowel obstruction s/p Exploratory laparotomy with sigmoid colectomy and creation of colostomy on 12/15  drain in place, continue iv abx/ ivf, on ng clamping trial today, hopefully able to remove ng tomorrow, general surgery input appreciated.   paroxysmal Atrial fibrillation with h/o bradycardia she is not on any rate or rhythm control agent at home. Patient on coumadin at home. INR of 2.96 on admission.   -reverse coumadin with vitamin K 2mg  and  ffp prior to surgery on 12/15,Heparin drip started on 12/18, hgb stable, no bleed from wound -Post op day one on 12/16, patient heart rate increased with intermittent afib, started on low dose iv lopressor, she is monitor in stepdown that day, later transferred to med tele.    Hypokalemia: replace k,  Hypothyroidism -synthroid 12. IV daily  Hypertension -bp stable on low dose iv lopressor -hydralazine prn  Severe malnutrition in context of chronic illness  baseline, independent, lives by herself, still  drives and cooks  DVT prophylaxis: SCDs Code Status: DNR Family Communication: patient  Disposition Plan: SNF Consults called:  General surgery Critical care  Procedures:  s/p Exploratory laparotomy with sigmoid colectomy and creation of colostomy on 12/15 by Dr. Harriette Bouillon.  Antibiotics:  Zosyn from admission   Objective: BP (!) 166/80 (BP Location: Right Arm)   Pulse 71   Temp 98.5 F (36.9 C) (Axillary)   Resp 16   Ht 5' 1.5" (1.562 m)   Wt 61.4 kg (135 lb 5.8 oz)   SpO2 94%   BMI 25.16 kg/m   Intake/Output Summary (Last 24 hours) at 12/29/15 0815 Last data filed at 12/29/15 8119  Gross per 24 hour  Intake          2042.07 ml  Output              995 ml  Net          1047.07 ml   Filed Weights   12/27/15 0607 12/28/15 0658 12/29/15 0537  Weight: 57.5 kg (126 lb 12.2 oz) 57.6 kg (126 lb 15.8 oz) 61.4 kg (135 lb 5.8 oz)    Exam:   General:  Look younger than stated age, no NG suction, NAD, aaox3, wearing hearing aids, heard of hearing, very pleasant  Cardiovascular: RRR with ectopic atrial beats  Respiratory: CTABL  Abdomen: post op changes, +colostomy, + drain  Musculoskeletal: No Edema  Neuro: aaox3  Data Reviewed: Basic Metabolic Panel:  Recent Labs Lab 12/25/15 0358 12/26/15 0321 12/27/15 0537 12/28/15 0345 12/29/15 0449  NA 134* 139 144 148* 149*  K 3.4* 3.5 3.8 3.8 4.1  CL 101 105  109 115* 118*  CO2 24 24 22 22 25   GLUCOSE 100* 111* 87 90 160*  BUN 27* 29* 36* 35* 28*  CREATININE 0.88 1.01* 1.03* 0.79 0.83  CALCIUM 8.5* 8.3* 8.5* 8.2* 8.3*  MG  --  1.9  --  2.4  --    Liver Function Tests:  Recent Labs Lab 12/24/15 0850  AST 24  ALT 16  ALKPHOS 106  BILITOT 0.9  PROT 7.2  ALBUMIN 3.7    Recent Labs Lab 12/24/15 0850  LIPASE 19   No results for input(s): AMMONIA in the last 168 hours. CBC:  Recent Labs Lab 12/25/15 0358 12/26/15 0321 12/27/15 0537 12/28/15 0345 12/29/15 0449  WBC 21.6* 27.2* 29.1*  27.2* 16.3*  NEUTROABS  --  25.9*  --  25.0* 14.2*  HGB 11.9* 12.1 11.3* 11.0* 11.6*  HCT 36.4 36.5 34.6* 34.1* 35.9*  MCV 88.6 88.2 89.4 90.5 90.4  PLT 383 341 366 415* 381   Cardiac Enzymes:   No results for input(s): CKTOTAL, CKMB, CKMBINDEX, TROPONINI in the last 168 hours. BNP (last 3 results) No results for input(s): BNP in the last 8760 hours.  ProBNP (last 3 results) No results for input(s): PROBNP in the last 8760 hours.  CBG:  Recent Labs Lab 12/28/15 0746  GLUCAP 78    Recent Results (from the past 240 hour(s))  Culture, Urine     Status: Abnormal   Collection Time: 12/24/15  8:50 AM  Result Value Ref Range Status   Specimen Description URINE, RANDOM  Final   Special Requests NONE  Final   Culture MULTIPLE SPECIES PRESENT, SUGGEST RECOLLECTION (A)  Final   Report Status 12/26/2015 FINAL  Final  Blood culture (routine x 2)     Status: None (Preliminary result)   Collection Time: 12/24/15  9:56 AM  Result Value Ref Range Status   Specimen Description BLOOD LEFT HAND  Final   Special Requests IN PEDIATRIC BOTTLE 1ML  Final   Culture   Final    NO GROWTH 4 DAYS Performed at [facility]    Report Status PENDING  Incomplete  Blood culture (routine x 2)     Status: None (Preliminary result)   Collection Time: 12/24/15 10:15 AM  Result Value Ref Range Status   Specimen Description BLOOD LEFT ANTECUBITAL  Final   Special Requests BOTTLES DRAWN AEROBIC AND ANAEROBIC 5ML  Final   Culture   Final    NO GROWTH 4 DAYS Performed at [facility]    Report Status PENDING  Incomplete  MRSA PCR Screening     Status: None   Collection Time: 12/25/15 10:30 PM  Result Value Ref Range Status   MRSA by PCR NEGATIVE NEGATIVE Final    Comment:        The GeneXpert MRSA Assay (FDA approved for NASAL specimens only), is one component of a comprehensive MRSA colonization surveillance program. It is not intended to diagnose MRSA infection nor to guide  or monitor treatment for MRSA infections.      Studies: No results found.  Scheduled Meds: . levothyroxine  12.5 mcg Intravenous QAC breakfast  . metoprolol  2.5 mg Intravenous Q12H  . pantoprazole (PROTONIX) IV  40 mg Intravenous Q24H  . piperacillin-tazobactam (ZOSYN)  IV  3.375 g Intravenous Q8H    Continuous Infusions: . dextrose 5% lactated ringers with KCl 20 mEq/L 75 mL/hr at 12/28/15 0948  . heparin 800 Units/hr (12/28/15 1252)     Time spent: 25mins  Marymargaret Kirker MD, PhD  [facility] Pager [phone]. If 7PM-7AM, please contact night-coverage at [email or web address], password [email or web address] 12/29/2015, 8:15 AM  LOS: 5 days

## 2015-12-30 DIAGNOSIS — E038 Other specified hypothyroidism: Secondary | ICD-10-CM

## 2015-12-30 DIAGNOSIS — I480 Paroxysmal atrial fibrillation: Secondary | ICD-10-CM

## 2015-12-30 DIAGNOSIS — I10 Essential (primary) hypertension: Secondary | ICD-10-CM

## 2015-12-30 DIAGNOSIS — K5720 Diverticulitis of large intestine with perforation and abscess without bleeding: Secondary | ICD-10-CM

## 2015-12-30 DIAGNOSIS — K631 Perforation of intestine (nontraumatic): Secondary | ICD-10-CM

## 2015-12-30 LAB — BASIC METABOLIC PANEL
Anion gap: 8 (ref 5–15)
BUN: 24 mg/dL — ABNORMAL HIGH (ref 6–20)
CHLORIDE: 116 mmol/L — AB (ref 101–111)
CO2: 25 mmol/L (ref 22–32)
Calcium: 8.4 mg/dL — ABNORMAL LOW (ref 8.9–10.3)
Creatinine, Ser: 0.86 mg/dL (ref 0.44–1.00)
GFR calc Af Amer: >60 mL/min (ref >60)
GFR calc non Af Amer: 56 mL/min — ABNORMAL LOW (ref >60)
Glucose, Bld: 111 mg/dL — ABNORMAL HIGH (ref 65–99)
POTASSIUM: 3.8 mmol/L (ref 3.5–5.1)
SODIUM: 149 mmol/L — AB (ref 135–145)

## 2015-12-30 LAB — CBC
HCT: 39 % (ref 36.0–46.0)
HEMOGLOBIN: 12.4 g/dL (ref 12.0–15.0)
MCH: 28.8 pg (ref 26.0–34.0)
MCHC: 31.8 g/dL (ref 30.0–36.0)
MCV: 90.5 fL (ref 78.0–100.0)
Platelets: 425 10*3/uL — ABNORMAL HIGH (ref 150–400)
RBC: 4.31 MIL/uL (ref 3.87–5.11)
RDW: 14.8 % (ref 11.5–15.5)
WBC: 16 10*3/uL — AB (ref 4.0–10.5)

## 2015-12-30 LAB — HEPARIN LEVEL (UNFRACTIONATED): HEPARIN UNFRACTIONATED: 0.36 [IU]/mL (ref 0.30–0.70)

## 2015-12-30 LAB — MAGNESIUM: MAGNESIUM: 1.9 mg/dL (ref 1.7–2.4)

## 2015-12-30 NOTE — Progress Notes (Signed)
[facility] Progress Note  5 Days Post-Op  Subjective: C/o intermittent, crampy abdominal pain that she describes as gas pains. Tolerating clamping trials. Gas and stool in ostomy pouch. Ambulating with therapies/RN staff. Denies fever, chills, nausea, or vomiting.  Objective: Vital signs in last 24 hours: Temp:  [97.6 F (36.4 C)-98.5 F (36.9 C)] 97.8 F (36.6 C) (12/20 0552) Pulse Rate:  [72-79] 75 (12/20 0552) Resp:  [16-18] 16 (12/20 0552) BP: (153-176)/(80-97) 176/89 (12/20 0552) SpO2:  [94 %-99 %] 99 % (12/20 0552) Weight:  [62.9 kg (138 lb 10.7 oz)] 62.9 kg (138 lb 10.7 oz) (12/20 0552) Last BM Date: 12/21/15  Intake/Output from previous day: 12/19 0701 - 12/20 0700 In: 2392 [I.V.:1992; IV Piggyback:400] Out: 767 [Urine:452; Emesis/NG output:125; Drains:140; Stool:50] Intake/Output this shift: Total I/O In: -  Out: 200 [Urine:200]  PE: Gen:  Alert, NAD, pleasant and cooperative Abd: Soft, appropriately tender, +BS, R drain with minimal serous drainage, colostomy pouch with good seal, stoma viable, flatus and stool in pouch.  Ext:  No erythema, edema, or tenderness    Lab Results:   Recent Labs  12/29/15 0449 12/30/15 0516  WBC 16.3* 16.0*  HGB 11.6* 12.4  HCT 35.9* 39.0  PLT 381 425*   BMET  Recent Labs  12/29/15 0449 12/30/15 0516  NA 149* 149*  K 4.1 3.8  CL 118* 116*  CO2 25 25  GLUCOSE 160* 111*  BUN 28* 24*  CREATININE 0.83 0.86  CALCIUM 8.3* 8.4*   CMP     Component Value Date/Time   NA 149 (H) 12/30/2015 0516   K 3.8 12/30/2015 0516   CL 116 (H) 12/30/2015 0516   CO2 25 12/30/2015 0516   GLUCOSE 111 (H) 12/30/2015 0516   BUN 24 (H) 12/30/2015 0516   CREATININE 0.86 12/30/2015 0516   CALCIUM 8.4 (L) 12/30/2015 0516   PROT 7.2 12/24/2015 0850   ALBUMIN 3.7 12/24/2015 0850   AST 24 12/24/2015 0850   ALT 16 12/24/2015 0850   ALKPHOS 106 12/24/2015 0850   BILITOT 0.9 12/24/2015 0850   GFRNONAA 56 (L) 12/30/2015 0516    GFRAA >60 12/30/2015 0516   Lipase     Component Value Date/Time   LIPASE 19 12/24/2015 0850   Anti-infectives: Anti-infectives    Start     Dose/Rate Route Frequency Ordered Stop   12/24/15 1800  piperacillin-tazobactam (ZOSYN) IVPB 3.375 g     3.375 g 12.5 mL/hr over 240 Minutes Intravenous Every 8 hours 12/24/15 1016     12/24/15 1015  piperacillin-tazobactam (ZOSYN) IVPB 3.375 g     3.375 g 100 mL/hr over 30 Minutes Intravenous  Once 12/24/15 1013 12/24/15 1101      Assessment/Plan Perforated sigmoid diverticulitis with peritonitis and small bowel obstruction S/p Exploratory laparotomy with sigmoid colectomy and creation of colostomy, pelvic drain placement, 12/25/15, Dr. Luisa Hartornett  POD #4  WBC 16.0 from 16.4 yesterday  Ambulate and IS  PT/OT  Atrial fibrillation on coumadin at home Hypertension Hypothyroid  Hx of tobacco use  FEN:  IV fluids/NPO ID: Day 7 Zosyn DVT: Heparin drip  Plan: d/c NGT and start clear liquid diet. Continue IV abx. Consult to WOC for patient education Ambulate/IS    LOS: 6 days    Adam PhenixElizabeth S Michiah Masse , [facility] 12/30/2015, 11:48 AM Pager: [phone] Consults: [phone] Mon-Fri 7:00 am-4:30 pm Sat-Sun 7:00 am-11:30 am

## 2015-12-30 NOTE — Consult Note (Signed)
WOC Nurse ostomy follow up Stoma type/location: LLQ colostomy (12/25/15).  WOC Nurse team consulted for first time today. Stomal assessment/size: Slightly larger than 1 and 3/4 inches round, red, raised, edematous Peristomal assessment: Intact, clear Treatment options for stomal/peristomal skin: Skin barrier ring.  Pouch is cut off-center to accommodate proximity to midline wound Output: brown loose stool Ostomy pouching: 2pc. 2 and 3/4 inch pouching system with skin barrier ring Education provided: Patient is taught that there are ostomy and other nurses who will continue teaching for her while in the hospital and nurses at the senior living ([facility] facility where she resides, [facility]. Further, HHRN/visiting nurses will also assist with continued teaching sessions after discharge. Patient is in pain today and is being medicated by the bedside nurse prior to patient walking. It is suspected her pain is partly due to gas, but she is reporting it as a "100" at this moment.  Patient is not able to participate in [place] closure demonstration or any part of pouch change procedure today.  An educational booklet is provided to the bedside as well as a 1-page teaching sheet on how to change a 2-piece pouching system.  Four additional pouches, skin barriers and skin barrier rings are at bedside. Enrolled patient in [place] [employer] Start Discharge program: Yes,today. My WOC nursing team partners will follow, and will remain available to this patient, the nursing and medical teams during my absence.  Thanks, Ladona MowLaurie Zaria Taha, MSN, RN, GNP, Hans EdenCWOCN, CWON-AP, FAAN  Pager# [phone]

## 2015-12-30 NOTE — Progress Notes (Signed)
Pharmacy Antibiotic Note  Candace Cain is a 82 y.o. female admitted on 12/24/2015 with intra-abdominal infection.  Pharmacy has been consulted for Zosyn dosing.  Plan: Day 4 Zosyn - continue current dosing for CrCl > 3320mls/min What is plan for length of therapy? Change to PO?  Height: 5' 1.5" (156.2 cm) Weight: 138 lb 10.7 oz (62.9 kg) IBW/kg (Calculated) : 48.95  Temp (24hrs), Avg:98 F (36.7 C), Min:97.6 F (36.4 C), Max:98.5 F (36.9 C)   Recent Labs Lab 12/24/15 1022 12/24/15 1338  12/26/15 0321 12/27/15 0537 12/28/15 0345 12/29/15 0449 12/30/15 0516  WBC  --   --   < > 27.2* 29.1* 27.2* 16.3* 16.0*  CREATININE  --   --   < > 1.01* 1.03* 0.79 0.83 0.86  LATICACIDVEN 0.90 0.64  --  0.6  --   --   --   --   < > = values in this interval not displayed.  Estimated Creatinine Clearance: 35.2 mL/min (by C-G formula based on SCr of 0.86 mg/dL).    No Known Allergies  Antimicrobials this admission: 12/14 Zosyn >>    Dose adjustments this admission: ---  Microbiology results: 12/14 BCx: ngtd 12/14 ucx: multiple species, suggest recollect   Thank you for allowing pharmacy to be a part of this patient's care.   Arley Phenixllen Dandy Lazaro RPh 12/30/2015, 10:35 AM Pager [phone]

## 2015-12-30 NOTE — Progress Notes (Signed)
PROGRESS NOTE    Candace Cain  ZOX:[id number] DOB: 1920-11-11 DOA: 12/24/2015 PCP: Duane Lope, MD   Brief Narrative:  Candace Cain is a 82 y.o. female with medical history significant of atrial fibrillation, hypertension, hypothyroidism. She states that about 1 week ago she started having some loose and watery stools. She took some Imodium which helped with her loose stools because her to be constipated. At that time, she started having abdominal pain. She started taking a stool softener over-the-counter to help with her constipation which helped. Her abdominal pain continued to worsen over the next few days and she started developing nausea. Her pain was so severe last night that she fell she needs to be evaluated in the [facility] department. Her last bowel movement was 4 days ago and her last time passing gas was 3 days ago. Found to have a perforated sigmoid diverticulitis with peritonitis and SBO s/p Exploratory laparotomy with Colostomy and drain placement 12/25/2015. NGT was clamped yesterday for trial and D/C'd this Am. Patient started on clears by General Surgery. On heparin gtt for A Fib and will likely transition to Coumadin once she can tolerate po adequately.   Assessment & Plan:   Principal Problem:   Diverticulitis of colon with perforation s/p colectomy/colostomy 12/25/2015 Active Problems:   Essential hypertension   Atrial fibrillation (HCC)   Hypothyroidism   Small bowel obstruction   Protein-calorie malnutrition, severe   Perforated bowel (HCC)   Diverticulitis of large intestine with abscess without bleeding   Hypokalemia  Perforated sigmoid diverticulitis with peritonitis and small bowel obstruction s/p Exploratory Laparotomy with sigmoid colectomy and colostomy creation POD5 -s/p Exploratory laparotomy with sigmoid colectomy and creation of colostomy on 12/15  -General Surgery following and recommend D/C'g NGT and starting Clear Liquid Diet as patient tolerated Clamping  Tube trials. -Pain Control with Hydromorphone 1 mg q2hprn for Severe Pain; Robaxin 500 mg IV q8hprn for Muscle spasms, Morphine 4 mg IV q3hprn for Moderate Pain -C/w with Ambulation; Ondansetron for Nausea -C/w IV Zosyn or intraabdominal Infection; WBC decreased to 16.0 today -Wound Care Consult for Patient Education per General Surgery -Clear Liquid Diet to be started.  -Appreciate General Surgery Evaluation and Further Recc's  Paroxysmal Atrial fibrillation with h/o bradycardia -She is not on any rate or rhythm control agent at home. Patient on coumadin at home. INR of 2.96 on admission.   -Reversed coumadin with vitamin K 2mg  and  ffp prior to surgery on 12/15, -Heparin drip started on 12/18, hgb stable, no bleed from wound -Post op day one on 12/16, patient heart rate increased with intermittent afib, started on low dose iv lopressor, she is monitor in stepdown that day, later transferred to med tele. -Continue with Med Telemetry and will likely switch Heparin gtt to Coumadin if patient tolerates diet; (likely tomorrow)  Hypokalemia:  -Patient's Potassium was 3.8 -Replete as necessary -Repeat BMP in AM  Mild Hypernatremia -Patient's Na+ was 149 -Continue to Monitor; Started on Clears -Repeat BMP in AM  Hypothyroidism -C/w Synthroid 12. IV daily -Transition to 25 mcg po Daily once able to tolerate diet  Hypertension -Bp Slightly elevated;  -on low dose IV Lopressor at 2.5 mg IV q12h -C/w hydralazine 5 mg IV q6hprn for SBP >160  Severe malnutrition in context of chronic illness -Started on Clear Liquid Diet  DVT prophylaxis: Heparin gtt currently Code Status: DNR Family Communication: Discussed with family at bedside Disposition Plan: Home vs. SNF; Will need PT Eval  Consultants:   General Surgery  PCCM   Procedures:  s/p Exploratory laparotomy with sigmoid colectomy and creation of colostomy, pelvic drain placement, 12/25/15, Dr. Luisa Hart  - POD  #5   Antimicrobials: IV Zosyn Day 7  Subjective: Patient was seen and examined this Am at bedside and she was complaining of crampy gas pains that would come and go and relieved with passing gas. Ostomy in place with no bleeding. NGT in place and draining very little. No CP/SOB however did have some nausea but no vomiting. No other concerns or complaints at this time.   Objective: Vitals:   12/29/15 0537 12/29/15 1300 12/29/15 2004 12/30/15 0552  BP: (!) 166/80 (!) 153/97 (!) 159/80 (!) 176/89  Pulse: 71 72 79 75  Resp: 16 18 16 16   Temp: 98.5 F (36.9 C) 97.6 F (36.4 C) 98.5 F (36.9 C) 97.8 F (36.6 C)  TempSrc: Axillary Oral Oral Oral  SpO2: 94% 94% 98% 99%  Weight: 61.4 kg (135 lb 5.8 oz)   62.9 kg (138 lb 10.7 oz)  Height:        Intake/Output Summary (Last 24 hours) at 12/30/15 0746 Last data filed at 12/30/15 4098  Gross per 24 hour  Intake             2392 ml  Output              637 ml  Net             1755 ml   Filed Weights   12/28/15 0658 12/29/15 0537 12/30/15 0552  Weight: 57.6 kg (126 lb 15.8 oz) 61.4 kg (135 lb 5.8 oz) 62.9 kg (138 lb 10.7 oz)    Examination: Physical Exam:  Constitutional: Elderly female who appears younger than stated age who is calm and comfortable in NAD Eyes: PERRL, lids and conjunctivae normal, sclerae anicteric  ENMT: External Ears, Nose appear normal. Grossly normal hearing. NGT in Right Nare Neck: Appears normal, supple, no cervical masses, normal ROM, no appreciable thyromegaly Respiratory: Clear to auscultation bilaterally, no wheezing, rales, rhonchi or crackles. Normal respiratory effort and patient is not tachypenic. No accessory muscle use.  Cardiovascular: RRR, no murmurs / rubs / gallops. S1 and S2 auscultated. No extremity edema Abdomen: Soft, non-tender, non-distended. No masses palpated. No appreciable hepatosplenomegaly. Bowel sounds positive. Colostomy in place and drain in place draining serous fluid. Mid line  incision appeared C/D/I GU: Deferred. Musculoskeletal: No clubbing / cyanosis of digits/nails. No joint deformity upper and lower extremities. No contractures. Skin: No rashes, lesions, ulcers on limited skin evaluation. No induration; Warm and dry.  Neurologic: CN 2-12 grossly intact with no focal deficits. Sensation intact in all 4 Extremities. Romberg sign cerebellar reflexes not assessed.  Psychiatric: Normal judgment and insight. Alert and oriented x 3. Normal mood and appropriate affect.   Data Reviewed: I have personally reviewed following labs and imaging studies  CBC:  Recent Labs Lab 12/26/15 0321 12/27/15 0537 12/28/15 0345 12/29/15 0449 12/30/15 0516  WBC 27.2* 29.1* 27.2* 16.3* 16.0*  NEUTROABS 25.9*  --  25.0* 14.2*  --   HGB 12.1 11.3* 11.0* 11.6* 12.4  HCT 36.5 34.6* 34.1* 35.9* 39.0  MCV 88.2 89.4 90.5 90.4 90.5  PLT 341 366 415* 381 425*   Basic Metabolic Panel:  Recent Labs Lab 12/26/15 0321 12/27/15 0537 12/28/15 0345 12/29/15 0449 12/30/15 0516  NA 139 144 148* 149* 149*  K 3.5 3.8 3.8 4.1 3.8  CL 105 109 115* 118* 116*  CO2 24 22 22  25  25  GLUCOSE 111* 87 90 160* 111*  BUN 29* 36* 35* 28* 24*  CREATININE 1.01* 1.03* 0.79 0.83 0.86  CALCIUM 8.3* 8.5* 8.2* 8.3* 8.4*  MG 1.9  --  2.4  --  1.9   GFR: Estimated Creatinine Clearance: 35.2 mL/min (by C-G formula based on SCr of 0.86 mg/dL). Liver Function Tests:  Recent Labs Lab 12/24/15 0850  AST 24  ALT 16  ALKPHOS 106  BILITOT 0.9  PROT 7.2  ALBUMIN 3.7    Recent Labs Lab 12/24/15 0850  LIPASE 19   No results for input(s): AMMONIA in the last 168 hours. Coagulation Profile:  Recent Labs Lab 12/24/15 1326 12/25/15 0358 12/25/15 1900 12/26/15 0321  INR 2.96 3.67 1.20 1.26   Cardiac Enzymes: No results for input(s): CKTOTAL, CKMB, CKMBINDEX, TROPONINI in the last 168 hours. BNP (last 3 results) No results for input(s): PROBNP in the last 8760 hours. HbA1C: No results for  input(s): HGBA1C in the last 72 hours. CBG:  Recent Labs Lab 12/28/15 0746  GLUCAP 78   Lipid Profile: No results for input(s): CHOL, HDL, LDLCALC, TRIG, CHOLHDL, LDLDIRECT in the last 72 hours. Thyroid Function Tests: No results for input(s): TSH, T4TOTAL, FREET4, T3FREE, THYROIDAB in the last 72 hours. Anemia Panel: No results for input(s): VITAMINB12, FOLATE, FERRITIN, TIBC, IRON, RETICCTPCT in the last 72 hours. Sepsis Labs:  Recent Labs Lab 12/24/15 1022 12/24/15 1338 12/26/15 0321  LATICACIDVEN 0.90 0.64 0.6    Recent Results (from the past 240 hour(s))  Culture, Urine     Status: Abnormal   Collection Time: 12/24/15  8:50 AM  Result Value Ref Range Status   Specimen Description URINE, RANDOM  Final   Special Requests NONE  Final   Culture MULTIPLE SPECIES PRESENT, SUGGEST RECOLLECTION (A)  Final   Report Status 12/26/2015 FINAL  Final  Blood culture (routine x 2)     Status: None   Collection Time: 12/24/15  9:56 AM  Result Value Ref Range Status   Specimen Description BLOOD LEFT HAND  Final   Special Requests IN PEDIATRIC BOTTLE 1ML  Final   Culture   Final    NO GROWTH 5 DAYS Performed at [facility]    Report Status 12/29/2015 FINAL  Final  Blood culture (routine x 2)     Status: None   Collection Time: 12/24/15 10:15 AM  Result Value Ref Range Status   Specimen Description BLOOD LEFT ANTECUBITAL  Final   Special Requests BOTTLES DRAWN AEROBIC AND ANAEROBIC 5ML  Final   Culture   Final    NO GROWTH 5 DAYS Performed at [facility]     Report Status 12/29/2015 FINAL  Final  MRSA PCR Screening     Status: None   Collection Time: 12/25/15 10:30 PM  Result Value Ref Range Status   MRSA by PCR NEGATIVE NEGATIVE Final    Comment:        The GeneXpert MRSA Assay (FDA approved for NASAL specimens only), is one component of a comprehensive MRSA colonization surveillance program. It is not intended to diagnose MRSA infection nor to guide  or monitor treatment for MRSA infections.     Radiology Studies: No results found.  Scheduled Meds: . acetaminophen  1,000 mg Intravenous Q6H  . levothyroxine  12.5 mcg Intravenous QAC breakfast  . metoprolol  2.5 mg Intravenous Q12H  . pantoprazole (PROTONIX) IV  40 mg Intravenous Q24H  . piperacillin-tazobactam (ZOSYN)  IV  3.375 g Intravenous Q8H  Continuous Infusions: . dextrose 5% lactated ringers with KCl 20 mEq/L 75 mL/hr at 12/30/15 0135  . heparin 800 Units/hr (12/29/15 1644)    LOS: 6 days   Merlene Laughtermair Latif Othelia Riederer, DO [facility] Pager [phone]  If 7PM-7AM, please contact night-coverage [email or web address] Password [email or web address] 12/30/2015, 7:46 AM

## 2015-12-30 NOTE — Progress Notes (Signed)
ANTICOAGULATION CONSULT NOTE -   Pharmacy Consult for heparin Indication: atrial fibrillation  No Known Allergies  Patient Measurements: Height: 5' 1.5" (156.2 cm) Weight: 138 lb 10.7 oz (62.9 kg) IBW/kg (Calculated) : 48.95 Heparin Dosing Weight: 57.6kg  Vital Signs: Temp: 97.8 F (36.6 C) (12/20 0552) Temp Source: Oral (12/20 0552) BP: 176/89 (12/20 0552) Pulse Rate: 75 (12/20 0552)  Labs:  Recent Labs  12/28/15 0345 12/28/15 2106 12/29/15 0449 12/30/15 0516 12/30/15 0708  HGB 11.0*  --  11.6* 12.4  --   HCT 34.1*  --  35.9* 39.0  --   PLT 415*  --  381 425*  --   HEPARINUNFRC  --  0.52 0.37  --  0.36  CREATININE 0.79  --  0.83 0.86  --     Estimated Creatinine Clearance: 35.2 mL/min (by C-G formula based on SCr of 0.86 mg/dL).   Medical History: Past Medical History:  Diagnosis Date  . Alopecia   . Atrial fibrillation (HCC)   . Bradycardia   . Unspecified essential hypertension      Assessment: 82 yo female admitted with Diverticulitis of colon with perforation s/p colectomy/colostomy 12/25/2015.  PTA warfarin for paroxysmal A Fib, INR supratherapeutic on admission, IV vitamin K and PRBC's given on 12/15. Pharmacy consulted to dose heparin drip for AFib.  12/30/2015 HL=therapeutic H/H improved to WNL No bleeding per RN   Goal of Therapy:  Heparin level 0.3-0.7 units/ml  Monitor platelets by anticoagulation protocol  Plan:  Continue Heparin drip at 800 units/hr Daily CBC and heparin level F/u when to resume oral anticoagulation   Arley PhenixEllen Upton Russey RPh 12/30/2015, 10:32 AM Pager [phone]

## 2015-12-31 ENCOUNTER — Inpatient Hospital Stay (HOSPITAL_COMMUNITY): Payer: Medicare Other

## 2015-12-31 DIAGNOSIS — E43 Unspecified severe protein-calorie malnutrition: Secondary | ICD-10-CM

## 2015-12-31 DIAGNOSIS — E876 Hypokalemia: Secondary | ICD-10-CM

## 2015-12-31 LAB — URINALYSIS, ROUTINE W REFLEX MICROSCOPIC
Bacteria, UA: NONE SEEN
Bilirubin Urine: NEGATIVE
Glucose, UA: NEGATIVE mg/dL
Ketones, ur: NEGATIVE mg/dL
Nitrite: NEGATIVE
Protein, ur: 30 mg/dL — AB
SPECIFIC GRAVITY, URINE: 1.028 (ref 1.005–1.030)
pH: 5 (ref 5.0–8.0)

## 2015-12-31 LAB — CBC
HEMATOCRIT: 31.3 % — AB (ref 36.0–46.0)
HEMATOCRIT: 37.7 % (ref 36.0–46.0)
HEMOGLOBIN: 10.1 g/dL — AB (ref 12.0–15.0)
HEMOGLOBIN: 12.2 g/dL (ref 12.0–15.0)
MCH: 29.2 pg (ref 26.0–34.0)
MCH: 28.8 pg (ref 26.0–34.0)
MCHC: 32.3 g/dL (ref 30.0–36.0)
MCHC: 32.4 g/dL (ref 30.0–36.0)
MCV: 90.5 fL (ref 78.0–100.0)
MCV: 88.9 fL (ref 78.0–100.0)
PLATELETS: 422 10*3/uL — AB (ref 150–400)
Platelets: 369 10*3/uL (ref 150–400)
RBC: 3.46 MIL/uL — AB (ref 3.87–5.11)
RBC: 4.24 MIL/uL (ref 3.87–5.11)
RDW: 14.9 % (ref 11.5–15.5)
RDW: 14.7 % (ref 11.5–15.5)
WBC: 14.9 10*3/uL — ABNORMAL HIGH (ref 4.0–10.5)
WBC: 17.7 10*3/uL — ABNORMAL HIGH (ref 4.0–10.5)

## 2015-12-31 LAB — CBC WITH DIFFERENTIAL/PLATELET
BASOS PCT: 0 %
Basophils Absolute: 0 10*3/uL (ref 0.0–0.1)
EOS ABS: 0.2 10*3/uL (ref 0.0–0.7)
Eosinophils Relative: 1 %
HCT: 38.5 % (ref 36.0–46.0)
HEMOGLOBIN: 12 g/dL (ref 12.0–15.0)
Lymphocytes Relative: 6 %
Lymphs Abs: 1.1 10*3/uL (ref 0.7–4.0)
MCH: 29.1 pg (ref 26.0–34.0)
MCHC: 31.2 g/dL (ref 30.0–36.0)
MCV: 93.4 fL (ref 78.0–100.0)
MONOS PCT: 4 %
Monocytes Absolute: 0.7 10*3/uL (ref 0.1–1.0)
NEUTROS PCT: 89 %
Neutro Abs: 15.5 10*3/uL — ABNORMAL HIGH (ref 1.7–7.7)
PLATELETS: 446 10*3/uL — AB (ref 150–400)
RBC: 4.12 MIL/uL (ref 3.87–5.11)
RDW: 15.3 % (ref 11.5–15.5)
WBC: 17.5 10*3/uL — ABNORMAL HIGH (ref 4.0–10.5)

## 2015-12-31 LAB — BASIC METABOLIC PANEL
Anion gap: 8 (ref 5–15)
BUN: 22 mg/dL — ABNORMAL HIGH (ref 6–20)
CHLORIDE: 113 mmol/L — AB (ref 101–111)
CO2: 27 mmol/L (ref 22–32)
CREATININE: 0.72 mg/dL (ref 0.44–1.00)
Calcium: 8.3 mg/dL — ABNORMAL LOW (ref 8.9–10.3)
GFR calc non Af Amer: >60 mL/min (ref >60)
Glucose, Bld: 122 mg/dL — ABNORMAL HIGH (ref 65–99)
POTASSIUM: 3.5 mmol/L (ref 3.5–5.1)
Sodium: 148 mmol/L — ABNORMAL HIGH (ref 135–145)

## 2015-12-31 MED ORDER — ENSURE ENLIVE PO LIQD: 237.0000 mL | ORAL | Status: DC

## 2015-12-31 MED ORDER — SENNA 8.6 MG PO TABS
1.0000 | ORAL_TABLET | Freq: Every day | ORAL | Status: DC
  Administered 2015-12-31 – 2016-01-03 (×4): 8.6 mg via ORAL
  Filled 2015-12-31 (×4): qty 1

## 2015-12-31 MED ORDER — IOPAMIDOL (ISOVUE-300) INJECTION 61%
INTRAVENOUS | Status: AC
  Filled 2015-12-31: qty 100

## 2015-12-31 MED ORDER — BOOST / RESOURCE BREEZE PO LIQD
1.0000 | Freq: Two times a day (BID) | ORAL | Status: DC
  Administered 2015-12-31: 1 via ORAL

## 2015-12-31 MED ORDER — HEPARIN (PORCINE) IN NACL 100-0.45 UNIT/ML-% IJ SOLN
800.0000 [IU]/h | INTRAMUSCULAR | Status: DC
  Administered 2015-12-31: 700 [IU]/h via INTRAVENOUS
  Administered 2016-01-01: 800 [IU]/h via INTRAVENOUS

## 2015-12-31 MED ORDER — IOPAMIDOL (ISOVUE-300) INJECTION 61%
INTRAVENOUS | Status: AC
  Filled 2015-12-31: qty 30

## 2015-12-31 MED ORDER — GLUCERNA SHAKE PO LIQD
237.0000 mL | Freq: Three times a day (TID) | ORAL | Status: DC
  Administered 2016-01-01 – 2016-01-02 (×6): 237 mL via ORAL
  Filled 2015-12-31 (×10): qty 237

## 2015-12-31 NOTE — Progress Notes (Signed)
PROGRESS NOTE    Candace Cain  ZOX:[id number] DOB: 01/29/1920 DOA: 12/24/2015 PCP: Duane Lope, MD   Brief Narrative:  Candace Cain is a 82 y.o. female with medical history significant of atrial fibrillation, hypertension, hypothyroidism. She states that about 1 week ago she started having some loose and watery stools. She took some Imodium which helped with her loose stools because her to be constipated. At that time, she started having abdominal pain. She started taking a stool softener over-the-counter to help with her constipation which helped. Her abdominal pain continued to worsen over the next few days and she started developing nausea. Her pain was so severe last night that she fell she needs to be evaluated in the [facility] department. Her last bowel movement was 4 days ago and her last time passing gas was 3 days ago. Found to have a perforated sigmoid diverticulitis with peritonitis and SBO s/p Exploratory laparotomy with Colostomy and drain placement 12/25/2015. NGT was clamped yesterday for trial and D/C'd this Am. Patient started on clears by General Surgery. On heparin gtt for A Fib and will likely transition to Coumadin once she can tolerate po adequately. Discussed with Surgery and will hold off on starting Coumadin until source of Leukocytosis is found.   Assessment & Plan:   Principal Problem:   Diverticulitis of colon with perforation s/p colectomy/colostomy 12/25/2015 Active Problems:   Essential hypertension   Atrial fibrillation (HCC)   Hypothyroidism   Small bowel obstruction   Protein-calorie malnutrition, severe   Perforated bowel (HCC)   Diverticulitis of large intestine with abscess without bleeding   Hypokalemia  Perforated sigmoid diverticulitis with peritonitis and small bowel obstruction s/p Exploratory Laparotomy with sigmoid colectomy and colostomy creation POD6 -s/p Exploratory laparotomy with sigmoid colectomy and creation of colostomy on 12/15. -Pain Control  with Hydromorphone 1 mg q2hprn for Severe Pain; Robaxin 500 mg IV q8hprn for Muscle spasms, Morphine 4 mg IV q3hprn for Moderate Pain -C/w with Ambulation; Ondansetron for Nausea -C/w IV Zosyn or intraabdominal Infection;  -WBC increased to 17.7 today (after repeat CBC was 17.5);  -CXR ordered by General Surgery and showed The lungs are reasonably well inflated. There small bilateral pleural effusions. The cardiac silhouette is top-normal in size. The pulmonary vascularity is not engorged. There is calcification in the wall of the thoracic aorta. The mediastinum is normal in width. There is mild degenerative disc disease at multiple thoracic spinal levels. -Urinalysis showed Cloudy Urine with Moderate Leukocytes, Negative Nitrites, and TNCT WBC's but patient is not complaining of Dysuria or burning. -Repeat CBC in AM -? Repeat CT Scan of Abd/Pelvis -Wound Care Consult for Patient Education per General Surgery -Clear Liquid Diet to be advanced to Full Liquid Diet.  -Appreciate General Surgery Evaluation and Further Recc's  Paroxysmal Atrial fibrillation with h/o Bradycardia -She is not on any rate or rhythm control agent at home. Patient on coumadin at home. INR of 2.96 on admission.   -Reversed coumadin with vitamin K 2mg  and  ffp prior to surgery on 12/15, -Heparin drip started on 12/18, hgb stable, no bleed from wound -Post op day one on 12/16, patient heart rate increased with intermittent afib, started on low dose iv lopressor, she is monitor in stepdown that day, later transferred to med tele. -Continue with Med Telemetry and plan was to switch Heparin gtt to Coumadin if patient tolerated diet however had a jump in WBC so ? Intrabdominal Process worsening? - CXR and U/A ordered. Patient was having more Abdominal Pain.  Hypokalemia:  -Improved. -Patient's Potassium was 3.5 -Replete as necessary -Repeat BMP in AM  Mild Hypernatremia -Patient's Na+ was 148 -Continue to Monitor;  Advance diet to Full -Repeat BMP in AM  Hypothyroidism -C/w Synthroid 12.5mcg IV daily -Transition to 25 mcg po Daily once able to tolerate diet  Hypertension -Bp Slightly elevated and SBP has been ranging from 158-163 DBP and 64-91; Currently BP was 139/64 -On low dose IV Lopressor at 2.5 mg IV q12h -C/w hydralazine 5 mg IV q6hprn for SBP >160  Severe malnutrition in context of chronic illness -Started on Clear Liquid Diet and likely Advance to Full Liquid Diet -Nutrition Consultation.   DVT prophylaxis: Heparin gtt currently Code Status: DNR Family Communication: Discussed with family at bedside Disposition Plan: Home vs. SNF; Will need PT Eval  Consultants:   General Surgery  PCCM   Procedures:  s/p Exploratory laparotomy with sigmoid colectomy and creation of colostomy, pelvic drain placement, 12/25/15, Dr. Luisa Hartornett  - POD #6   Antimicrobials: IV Zosyn Day 8  Subjective: Patient was seen and examined this Am at bedside and she and she had just come back from walking. Had some mild abdominal discomfort this morning but was tolerating her clears in small amounts. No N/V. No CP or SOB. Feels better with NG out. No other complaints or concerns at this time.   Objective: Vitals:   12/30/15 0552 12/30/15 1341 12/30/15 2113 12/31/15 0546  BP: (!) 176/89 (!) 158/86 (!) 163/91 (!) 161/75  Pulse: 75 65 71 65  Resp: 16 16 16 16   Temp: 97.8 F (36.6 C) 98.5 F (36.9 C) 98.1 F (36.7 C) 98.2 F (36.8 C)  TempSrc: Oral Oral Oral Oral  SpO2: 99% 99% 99% 99%  Weight: 62.9 kg (138 lb 10.7 oz)     Height:        Intake/Output Summary (Last 24 hours) at 12/31/15 1323 Last data filed at 12/31/15 1057  Gross per 24 hour  Intake             3022 ml  Output              960 ml  Net             2062 ml   Filed Weights   12/28/15 0658 12/29/15 0537 12/30/15 0552  Weight: 57.6 kg (126 lb 15.8 oz) 61.4 kg (135 lb 5.8 oz) 62.9 kg (138 lb 10.7 oz)    Examination: Physical  Exam:  Constitutional: Elderly female who appears younger than stated age who is calm and comfortable in NAD Eyes: PERRL, lids and conjunctivae normal, sclerae anicteric  ENMT: External Ears, Nose appear normal. Grossly normal hearing. NGT in Right Nare has been removed Neck: Appears normal, supple, no cervical masses, normal ROM, no appreciable thyromegaly Respiratory: Clear to auscultation bilaterally, no wheezing, rales, rhonchi or crackles. Normal respiratory effort and patient is not tachypenic. No accessory muscle use.  Cardiovascular: Regular Rhthym slightly brady, no murmurs / rubs / gallops. S1 and S2 auscultated. No extremity edema Abdomen: Soft, non-tender, non-distended. No masses palpated. No appreciable hepatosplenomegaly. Bowel sounds positive. Colostomy in place with very little output and is flat. Mid line incision appeared to have some dry blood.  GU: Deferred. Musculoskeletal: No clubbing / cyanosis of digits/nails. No joint deformity upper and lower extremities. No contractures. Skin: No rashes, lesions, ulcers on limited skin evaluation. No induration; Warm and dry.  Neurologic: CN 2-12 grossly intact with no focal deficits. Sensation intact in all 4  Extremities. Romberg sign cerebellar reflexes not assessed.  Psychiatric: Normal judgment and insight. Alert and oriented x 3. Normal and pleasant mood and appropriate affect.   Data Reviewed: I have personally reviewed following labs and imaging studies  CBC:  Recent Labs Lab 12/26/15 0321  12/28/15 0345 12/29/15 0449 12/30/15 0516 12/31/15 0501 12/31/15 0620 12/31/15 1230  WBC 27.2*  < > 27.2* 16.3* 16.0* 14.9* 17.7* 17.5*  NEUTROABS 25.9*  --  25.0* 14.2*  --   --   --  15.5*  HGB 12.1  < > 11.0* 11.6* 12.4 10.1* 12.2 12.0  HCT 36.5  < > 34.1* 35.9* 39.0 31.3* 37.7 38.5  MCV 88.2  < > 90.5 90.4 90.5 90.5 88.9 93.4  PLT 341  < > 415* 381 425* 369 422* 446*  < > = values in this interval not displayed. Basic  Metabolic Panel:  Recent Labs Lab 12/26/15 0321 12/27/15 0537 12/28/15 0345 12/29/15 0449 12/30/15 0516 12/31/15 0620  NA 139 144 148* 149* 149* 148*  K 3.5 3.8 3.8 4.1 3.8 3.5  CL 105 109 115* 118* 116* 113*  CO2 24 22 22 25 25 27   GLUCOSE 111* 87 90 160* 111* 122*  BUN 29* 36* 35* 28* 24* 22*  CREATININE 1.01* 1.03* 0.79 0.83 0.86 0.72  CALCIUM 8.3* 8.5* 8.2* 8.3* 8.4* 8.3*  MG 1.9  --  2.4  --  1.9  --    GFR: Estimated Creatinine Clearance: 37.9 mL/min (by C-G formula based on SCr of 0.72 mg/dL). Liver Function Tests: No results for input(s): AST, ALT, ALKPHOS, BILITOT, PROT, ALBUMIN in the last 168 hours. No results for input(s): LIPASE, AMYLASE in the last 168 hours. No results for input(s): AMMONIA in the last 168 hours. Coagulation Profile:  Recent Labs Lab 12/24/15 1326 12/25/15 0358 12/25/15 1900 12/26/15 0321  INR 2.96 3.67 1.20 1.26   Cardiac Enzymes: No results for input(s): CKTOTAL, CKMB, CKMBINDEX, TROPONINI in the last 168 hours. BNP (last 3 results) No results for input(s): PROBNP in the last 8760 hours. HbA1C: No results for input(s): HGBA1C in the last 72 hours. CBG:  Recent Labs Lab 12/28/15 0746  GLUCAP 78   Lipid Profile: No results for input(s): CHOL, HDL, LDLCALC, TRIG, CHOLHDL, LDLDIRECT in the last 72 hours. Thyroid Function Tests: No results for input(s): TSH, T4TOTAL, FREET4, T3FREE, THYROIDAB in the last 72 hours. Anemia Panel: No results for input(s): VITAMINB12, FOLATE, FERRITIN, TIBC, IRON, RETICCTPCT in the last 72 hours. Sepsis Labs:  Recent Labs Lab 12/24/15 1338 12/26/15 0321  LATICACIDVEN 0.64 0.6    Recent Results (from the past 240 hour(s))  Culture, Urine     Status: Abnormal   Collection Time: 12/24/15  8:50 AM  Result Value Ref Range Status   Specimen Description URINE, RANDOM  Final   Special Requests NONE  Final   Culture MULTIPLE SPECIES PRESENT, SUGGEST RECOLLECTION (A)  Final   Report Status  12/26/2015 FINAL  Final  Blood culture (routine x 2)     Status: None   Collection Time: 12/24/15  9:56 AM  Result Value Ref Range Status   Specimen Description BLOOD LEFT HAND  Final   Special Requests IN PEDIATRIC BOTTLE  Final   Culture   Final    NO GROWTH 5 DAYS Performed at [facility]    Report Status 12/29/2015 FINAL  Final  Blood culture (routine x 2)     Status: None   Collection Time: 12/24/15 10:15 AM  Result Value Ref Range Status   Specimen Description BLOOD LEFT ANTECUBITAL  Final   Special Requests BOTTLES DRAWN AEROBIC AND ANAEROBIC 5ML  Final   Culture   Final    NO GROWTH 5 DAYS [facility]    Report Status 12/29/2015 FINAL  Final  MRSA PCR Screening     Status: None   Collection Time: 12/25/15 10:30 PM  Result Value Ref Range Status   MRSA by PCR NEGATIVE NEGATIVE Final    Comment:        The GeneXpert MRSA Assay (FDA approved for NASAL specimens only), is one component of a comprehensive MRSA colonization surveillance program. It is not intended to diagnose MRSA infection nor to guide or monitor treatment for MRSA infections.     Radiology Studies: Dg Chest 2 View  Result Date: 12/31/2015 CLINICAL DATA:  Shortness of breath, elevated white blood cell count. History of atrial fibrillation, former smoker. EXAM: CHEST  2 VIEW COMPARISON:  Portable chest x-ray of April 28, 2014 FINDINGS: The lungs are reasonably well inflated. There small bilateral pleural effusions. The cardiac silhouette is top-normal in size. The pulmonary vascularity is not engorged. There is calcification in the wall of the thoracic aorta. The mediastinum is normal in width. There is mild degenerative disc disease at multiple thoracic spinal levels. IMPRESSION: COPD. New small bilateral pleural effusions. No alveolar pneumonia nor pulmonary edema. Thoracic aortic atherosclerosis. Electronically Signed   By: David  SwazilandJordan M.D.   On: 12/31/2015 12:07    Scheduled Meds: . feeding supplement  1 Container Oral BID BM  . feeding supplement (ENSURE ENLIVE)  237 mL Oral Q24H  . levothyroxine  12.5 mcg Intravenous QAC breakfast  . metoprolol  2.5 mg Intravenous Q12H  . pantoprazole (PROTONIX) IV  40 mg Intravenous Q24H  . piperacillin-tazobactam (ZOSYN)  IV  3.375 g Intravenous Q8H  . senna  1 tablet Oral Daily   Continuous Infusions: . dextrose 5% lactated ringers with KCl 20 mEq/L 75 mL/hr at 12/31/15 0546  . heparin 800 Units/hr (12/31/15 0104)    LOS: 7 days   Merlene Laughtermair Latif Jasenia Weilbacher, DO [facility] Pager [phone]  If 7PM-7AM, please contact night-coverage [email or web address] Password [email or web address] 12/31/2015, 1:23 PM

## 2015-12-31 NOTE — Progress Notes (Addendum)
ANTICOAGULATION CONSULT NOTE -   Pharmacy Consult for heparin Indication: atrial fibrillation  No Known Allergies  Patient Measurements: Height: 5' 1.5" (156.2 cm) Weight: 138 lb 10.7 oz (62.9 kg) IBW/kg (Calculated) : 48.95 Heparin Dosing Weight: 57.6kg  Vital Signs: Temp: 98.2 F (36.8 C) (12/21 0546) Temp Source: Oral (12/21 0546) BP: 161/75 (12/21 0546) Pulse Rate: 65 (12/21 0546)  Labs:  Recent Labs  12/29/15 0449 12/30/15 0516 12/30/15 0708 12/31/15 0501 12/31/15 0620  HGB 11.6* 12.4  --  10.1* 12.2  HCT 35.9* 39.0  --  31.3* 37.7  PLT 381 425*  --  369 422*  HEPARINUNFRC 0.37  --  0.36 >2.00*  --   CREATININE 0.83 0.86  --   --  0.72    Estimated Creatinine Clearance: 37.9 mL/min (by C-G formula based on SCr of 0.72 mg/dL).   Medical History: Past Medical History:  Diagnosis Date  . Alopecia   . Atrial fibrillation (HCC)   . Bradycardia   . Unspecified essential hypertension      Assessment: 82 yo female admitted with Diverticulitis of colon with perforation s/p colectomy/colostomy 12/25/2015.  PTA warfarin for paroxysmal A Fib, INR supratherapeutic on admission, IV vitamin K and PRBC's given on 12/15. Pharmacy consulted to dose heparin drip for AFib.  12/31/2015 HL >2 (supra-therapeutic), however drawn above line where heparin infusing.  RN attempted re-collect, but unable to obtain.  Patient now refusing further sticks.  H/H improved to WNL, Pltc wnl No bleeding or infusion-related issues reported per RN.  Spoke with patient & IV nurse able to re-collect blood sample.   Goal of Therapy:  Heparin level 0.3-0.7 units/ml  Monitor platelets by anticoagulation protocol  Plan:  Continue Heparin drip at 800 units/hr F/U repeat heparin level Daily CBC and heparin level F/u when to resume oral anticoagulation  Junita PushMichelle Henry Demeritt, PharmD, BCPS Pager: [phone] 12/31/2015, 10:22 AM  Repeat heparin level still elevated.   Hold heparin x2 hours  then resume at 700 units/hr Recheck 8hr heparin level.  Plan discussed with RN.   Junita PushMichelle Jakeb Lamping, PharmD, BCPS Pager: [phone] 12/31/2015@3 :29 PM

## 2015-12-31 NOTE — Progress Notes (Signed)
[facility] Progress Note  6 Days Post-Op  Subjective: Abdominal pain controlled - pain is described as the same as yesterday, still experiencing intermittent cramping abdominal pain that is worse over right hemi-abdomen. Denies fever, chills, nausea or vomiting. Tolerating diet, eating very small amounts at a time. Pulling low volumes on IS 250-550 cc.  Working with therapies. Plans to return to senior living facility at discharge.   Objective: Vital signs in last 24 hours: Temp:  [98.1 F (36.7 C)-98.5 F (36.9 C)] 98.2 F (36.8 C) (12/21 0546) Pulse Rate:  [65-71] 65 (12/21 0546) Resp:  [16] 16 (12/21 0546) BP: (158-163)/(75-91) 161/75 (12/21 0546) SpO2:  [99 %] 99 % (12/21 0546) Last BM Date: 12/30/15 (colostomy)  Intake/Output from previous day: 12/20 0701 - 12/21 0700 In: 2572 [P.O.:480; I.V.:1992; IV Piggyback:100] Out: 1130 [Urine:850; Drains:280] Intake/Output this shift: Total I/O In: 100 [P.O.:100] Out: 30 [Drains:30]  PE: Gen:  Alert, NAD, pleasant and cooperative Pulm:  CTA, diminished breath sounds in bilateral lung bases Abd: Soft, appropriately tender, mild distention, +BS, midline incision healing appropriately without necrosis or slough - dried blood at superior skin margin,  Colostomy pouch with good seal - pouch is flat today with no gas and minimal liquid stool (<5cc).  Lab Results:   Recent Labs  12/31/15 0501 12/31/15 0620  WBC 14.9* 17.7*  HGB 10.1* 12.2  HCT 31.3* 37.7  PLT 369 422*   BMET  Recent Labs  12/30/15 0516 12/31/15 0620  NA 149* 148*  K 3.8 3.5  CL 116* 113*  CO2 25 27  GLUCOSE 111* 122*  BUN 24* 22*  CREATININE 0.86 0.72  CALCIUM 8.4* 8.3*   PT/INR No results for input(s): LABPROT, INR in the last 72 hours. CMP     Component Value Date/Time   NA 148 (H) 12/31/2015 0620   K 3.5 12/31/2015 0620   CL 113 (H) 12/31/2015 0620   CO2 27 12/31/2015 0620   GLUCOSE 122 (H) 12/31/2015 0620   BUN 22 (H)  12/31/2015 0620   CREATININE 0.72 12/31/2015 0620   CALCIUM 8.3 (L) 12/31/2015 0620   PROT 7.2 12/24/2015 0850   ALBUMIN 3.7 12/24/2015 0850   AST 24 12/24/2015 0850   ALT 16 12/24/2015 0850   ALKPHOS 106 12/24/2015 0850   BILITOT 0.9 12/24/2015 0850   GFRNONAA >60 12/31/2015 0620   GFRAA >60 12/31/2015 0620   Lipase     Component Value Date/Time   LIPASE 19 12/24/2015 0850   Anti-infectives: Anti-infectives    Start     Dose/Rate Route Frequency Ordered Stop   12/24/15 1800  piperacillin-tazobactam (ZOSYN) IVPB 3.375 g     3.375 g 12.5 mL/hr over 240 Minutes Intravenous Every 8 hours 12/24/15 1016     12/24/15 1015  piperacillin-tazobactam (ZOSYN) IVPB 3.375 g     3.375 g 100 mL/hr over 30 Minutes Intravenous  Once 12/24/15 1013 12/24/15 1101     Assessment/Plan Perforated sigmoid diverticulitis with peritonitis and small bowel obstruction S/p Exploratory laparotomy with sigmoid colectomy and creation of colostomy, pelvic drain placement, 12/25/15, Dr. Luisa Hartornett         POD #6  Afebrile             WBC 17.7 from 14.9 earlier this morning and 16.0 yesterday - repeat CBC pending  12/20 - gas and stool in ostomy pouch; today bag is flat and there is minimal stool - no BMs charted in epic in past 24h.  Ambulate and IS             PT/OT  Atrial fibrillation on coumadin at home - heparin drip Hypertension Hypothyroid - synthroid Hx of tobacco use   FEN: clear liquid diet, advance to fill liquids today. ID: Day 8 Zosyn DVT: Heparin drip  Plan:  1. Increasing leukocytosis - repeat CBC pending. CXR to r/o PNA. UA to r/o UTI. Spoke with medicine - plan to hold on re-initiation of coumadin until leukocytosis workup is complete and post-op intra-abdominal infection has been ruled out. Continue IV abx. May repeat CT scan today - will discuss with MD.  2. Diet -Tolerating clears, +BS, no nausea, advance to full liquids    LOS: 7 days    Candace PhenixElizabeth S May Manrique ,  [facility] 12/31/2015, 9:53 AM Pager: [phone] Consults: [phone] Mon-Fri 7:00 am-4:30 pm Sat-Sun 7:00 am-11:30 am

## 2015-12-31 NOTE — Progress Notes (Signed)
Nutrition Follow-up  DOCUMENTATION CODES:   Severe malnutrition in context of chronic illness  INTERVENTION:  - Continue CLD.  - Will order Boost Breeze BID, each supplement provides 250 kcal and 9 grams of protein. - Will order Ensure Enlive once/day, this supplement provides 350 kcal and 20 grams of protein - RD will follow-up 12/22.  NUTRITION DIAGNOSIS:   Inadequate oral intake related to inability to eat as evidenced by NPO status. -diet advanced to CLD 12/20 @ 1208.   GOAL:   Patient will meet greater than or equal to 90% of their needs -unable to meet on CLD.  MONITOR:   PO intake, Supplement acceptance, Diet advancement, Weight trends, Labs, I & O's  ASSESSMENT:   82 year old female with medical history significant for atrial fibrillation, hypertension, hypothyroidism who presents with 1 week of abdominal pain, constipation, nausea. Patient found to have acute small bowel obstruction with mesenteric edema suspicious for volvulus and acute sigmoid colon diverticulitis with possible abscesses.  12/21 POD #6. NGT was clamped 12/19 and removed yesterday. Diet advanced from NPO to CLD yesterday afternoon with 100% completion of dinner documented. Unable to see pt/talk with pt x2 attempted visits this AM; will follow[facility]up tomorrow [facility]. Weight from 12/19-12/20 was +1.5 kg; will continue to monitor weight trends.   Reviewed Surgery PA note from today at [phone]: Abdominal pain controlled - pain is described as the same as yesterday, still experiencing intermittent cramping abdominal pain that is worse over right hemi-abdomen. Denies fever, chills, nausea or vomiting. Tolerating diet, eating very small amounts at a time. Tolerating clears, +BS, no nausea, advance to full liquids.  Medications reviewed; 12.5 mg IV Synthroid/day, PRN IV Zofran, 40 mg IV Protonix/day, 1 tablet Senokot/day.  Labs reviewed; Na: 148 mmol/L, Cl: 113 mmol/L, BUN: 22 mg/dL,  Ca: 8.3 mg/dL.  IVF: D5-LR-20 mEq KCl @ 75 mL/hr (306 kcal).    12/19 - Pt is POD #4 ex lap with sigmoid colectomy and colostomy.  - Pt has been NPO since 12/15.  - Weight has fluctuated some since time of surgery but weight today and weight 12/15 are consistent with each other.  - Pt with NGT to suction.  - Still no stool from new ostomy.   IVF: D5-LR-20 mEq KCl @ 75 mL/hr (306 kcal).    12/15 - Per Surgery note, patient will decide if she wants to pursue exploratory laparotomy with possible small and large bowel resection and possible colostomy or if she will want to remain comfortable and continue medical treatment only.  - She endorsed she has had nausea, abdominal pain, and constipation for the past week.  - She also endorses abdominal distension.  - She is now day 7 without any food as she could not tolerate food.  - Prior to onset of symptoms, she reports eating 3 light meals daily.  - Patient with NG tube to low intermittent suction.  - On assessment noted output of approximately 650 ml. - Patient reports UBW of 120 lbs and that she believes she has been gaining weight with her distention.  - Limited weight history in chart to confirm, but patient was 129 lbs on 10/20/2015.  - Nutrition-Focused physical exam completed.  - Findings are moderate-severefat depletion, moderate-severemuscle depletion, and noedema.   Patient meets criteria for severe chronic malnutrition in setting of severe fat depletion, severe muscle depletion.    Diet Order:  Diet clear liquid Room service appropriate? Yes; Fluid consistency:  Thin  Skin:  Wound (see comment) (Abdominal surgical wound from 12/15)  Last BM:  12/20  Height:   Ht Readings from Last 1 Encounters:  12/24/15 5' 1.5" (1.562 m)    Weight:   Wt Readings from Last 1 Encounters:  12/30/15 138 lb 10.7 oz (62.9 kg)    Ideal Body Weight:  48.9 kg  BMI:  Body mass index is 25.78 kg/m.  Estimated Nutritional  Needs:   Kcal:  1425-1641 (MSJ x 1.3-1.5)  Protein:  80-92 grams (1.4-1.6 grams/kg)  Fluid:  >/= 1.4 L/day (25 ml/kg)  EDUCATION NEEDS:   No education needs identified at this time     Trenton GammonJessica Ulmer Degen, MS, RD, LDN, CNSC Inpatient Clinical Dietitian Pager # [phone] After hours/weekend pager # [phone]

## 2015-12-31 NOTE — Progress Notes (Signed)
Physical Therapy Treatment Patient Details Name: Candace Cain MRN: [id number] DOB: May 28, 1920 Today's Date: 12/31/2015    History of Present Illness [name] is a 82 y/o woman with a hx of PAF on coumadin who presented with abdominal pain and perforated diverticulitis who underwent ex-lap with repair and ostomy.She  has a past medical history of Alopecia; Atrial fibrillation (HCC); Bradycardia; and Unspecified essential hypertension., and R hip hemiarthroplasty.    PT Comments    Pt's mobility is progressing well.  Pt ambulated in hallway and agreeable to ambulate multiple times a day with daughter or staff.  Follow Up Recommendations  SNF     Equipment Recommendations  None recommended by PT    Recommendations for Other Services       Precautions / Restrictions Precautions Precautions: Fall Precaution Comments: colostomy Restrictions Weight Bearing Restrictions: No    Mobility  Bed Mobility               General bed mobility comments: pt up after using bathroom on arrival  Transfers   Equipment used: Rolling walker (2 wheeled) Transfers: Sit to/from Stand Sit to Stand: Min guard         General transfer comment: verbal cues for safe technique  Ambulation/Gait Ambulation/Gait assistance: Min guard Ambulation Distance (Feet): 120 Feet Assistive device: Rolling walker (2 wheeled) Gait Pattern/deviations: Step-through pattern;Decreased stride length;Trunk flexed     General Gait Details: verbal cues for posture, steady with RW   Stairs            Wheelchair Mobility    Modified Rankin (Stroke Patients Only)       Balance                                    Cognition Arousal/Alertness: Awake/alert Behavior During Therapy: WFL for tasks assessed/performed Overall Cognitive Status: Within Functional Limits for tasks assessed                      Exercises      General Comments        Pertinent Vitals/Pain  Pain Assessment: No/denies pain    Home Living                      Prior Function            PT Goals (current goals can now be found in the care plan section) Progress towards PT goals: Progressing toward goals    Frequency    Min 3X/week      PT Plan Current plan remains appropriate    Co-evaluation             End of Session   Activity Tolerance: Patient tolerated treatment well Patient left: in chair;with call bell/phone within reach;with family/visitor present     Time: 0915-0930 PT Time Calculation (min) (ACUTE ONLY): 15 min  Charges:  $Gait Training: 8-22 mins                    G Codes:      Sanjeev Main,KATHrine E 12/31/2015, 12:31 PM Zenovia JarredKati Maveryck Bahri, PT, DPT 12/31/2015 Pager: [phone]

## 2016-01-01 LAB — CBC
HCT: 35.4 % — ABNORMAL LOW (ref 36.0–46.0)
HEMOGLOBIN: 11.4 g/dL — AB (ref 12.0–15.0)
MCH: 28.7 pg (ref 26.0–34.0)
MCHC: 32.2 g/dL (ref 30.0–36.0)
MCV: 89.2 fL (ref 78.0–100.0)
Platelets: 423 10*3/uL — ABNORMAL HIGH (ref 150–400)
RBC: 3.97 MIL/uL (ref 3.87–5.11)
RDW: 14.6 % (ref 11.5–15.5)
WBC: 15.9 10*3/uL — ABNORMAL HIGH (ref 4.0–10.5)

## 2016-01-01 LAB — BASIC METABOLIC PANEL
Anion gap: 5 (ref 5–15)
BUN: 21 mg/dL — AB (ref 6–20)
CALCIUM: 8.3 mg/dL — AB (ref 8.9–10.3)
CHLORIDE: 111 mmol/L (ref 101–111)
CO2: 28 mmol/L (ref 22–32)
CREATININE: 0.78 mg/dL (ref 0.44–1.00)
GFR calc non Af Amer: >60 mL/min (ref >60)
Glucose, Bld: 130 mg/dL — ABNORMAL HIGH (ref 65–99)
Potassium: 3.6 mmol/L (ref 3.5–5.1)
SODIUM: 144 mmol/L (ref 135–145)

## 2016-01-01 LAB — HEPARIN LEVEL (UNFRACTIONATED)
HEPARIN UNFRACTIONATED: 1.51 [IU]/mL — AB (ref 0.30–0.70)
HEPARIN UNFRACTIONATED: 0.1 [IU]/mL — AB (ref 0.30–0.70)
Heparin Unfractionated: 0.25 IU/mL — ABNORMAL LOW (ref 0.30–0.70)

## 2016-01-01 LAB — MAGNESIUM: MAGNESIUM: 1.6 mg/dL — AB (ref 1.7–2.4)

## 2016-01-01 LAB — PHOSPHORUS: Phosphorus: 3.1 mg/dL (ref 2.5–4.6)

## 2016-01-01 MED ORDER — SIMETHICONE 80 MG PO CHEW
80.0000 mg | CHEWABLE_TABLET | ORAL | Status: DC | PRN
  Administered 2016-01-01 (×2): 80 mg via ORAL
  Filled 2016-01-01 (×2): qty 1

## 2016-01-01 MED ORDER — LEVOTHYROXINE SODIUM 25 MCG PO TABS
25.0000 ug | ORAL_TABLET | Freq: Every day | ORAL | Status: DC
  Administered 2016-01-02 – 2016-01-03 (×2): 25 ug via ORAL
  Filled 2016-01-01 (×2): qty 1

## 2016-01-01 MED ORDER — WARFARIN SODIUM 3 MG PO TABS
3.5000 mg | ORAL_TABLET | Freq: Once | ORAL | Status: AC
  Administered 2016-01-01: 3.5 mg via ORAL
  Filled 2016-01-01: qty 1

## 2016-01-01 MED ORDER — HEPARIN (PORCINE) IN NACL 100-0.45 UNIT/ML-% IJ SOLN
900.0000 [IU]/h | INTRAMUSCULAR | Status: DC
Start: 2016-01-01 — End: 2016-01-03
  Administered 2016-01-02: 900 [IU]/h via INTRAVENOUS
  Filled 2016-01-01: qty 250

## 2016-01-01 MED ORDER — OXYCODONE HCL 5 MG PO TABS
5.0000 mg | ORAL_TABLET | ORAL | Status: DC | PRN
  Administered 2016-01-01 – 2016-01-02 (×3): 5 mg via ORAL
  Administered 2016-01-02 – 2016-01-03 (×3): 10 mg via ORAL
  Filled 2016-01-01 (×3): qty 2
  Filled 2016-01-01: qty 1
  Filled 2016-01-01: qty 2
  Filled 2016-01-01 (×2): qty 1

## 2016-01-01 MED ORDER — WARFARIN - PHARMACIST DOSING INPATIENT: Freq: Every day | Status: DC

## 2016-01-01 MED ORDER — ACETAMINOPHEN 325 MG PO TABS
650.0000 mg | ORAL_TABLET | Freq: Four times a day (QID) | ORAL | Status: DC | PRN
Start: 2016-01-01 — End: 2016-01-03

## 2016-01-01 NOTE — Progress Notes (Signed)
PROGRESS NOTE    Candace Cain  ZOX:[id number] DOB: 1920/11/20 DOA: 12/24/2015 PCP: Duane LopeAlan Ross, MD   Brief Narrative:  Candace Fowlerlva Peter is a 82 y.o. female with medical history significant of atrial fibrillation, hypertension, hypothyroidism. She states that about 1 week ago she started having some loose and watery stools. She took some Imodium which helped with her loose stools because her to be constipated. At that time, she started having abdominal pain. She started taking a stool softener over-the-counter to help with her constipation which helped. Her abdominal pain continued to worsen over the next few days and she started developing nausea. Her pain was so severe last night that she fell she [facility]. Her last bowel movement was 4 days ago and her last time passing gas was 3 days ago. Found to have a perforated sigmoid diverticulitis with peritonitis and SBO s/p Exploratory laparotomy with Colostomy and drain placement 12/25/2015. NGT was clamped yesterday for trial and D/C'd this Am. Patient started on clears by General Surgery. On heparin gtt for A Fib and will likely transition to Coumadin once she can tolerate po adequately. Discussed with Surgery Leukocytosis is trending down and no source of infection found. Ok to start on Coumadin and changed IV meds to po. Patient advanced to soft diet today.   Assessment & Plan:   Principal Problem:   Diverticulitis of colon with perforation s/p colectomy/colostomy 12/25/2015 Active Problems:   Essential hypertension   Atrial fibrillation (HCC)   Hypothyroidism   Small bowel obstruction   Protein-calorie malnutrition, severe   Perforated bowel (HCC)   Diverticulitis of large intestine with abscess without bleeding   Hypokalemia  Perforated sigmoid diverticulitis with peritonitis and small bowel obstruction s/p Exploratory Laparotomy with sigmoid colectomy and colostomy creation POD7 -s/p Exploratory laparotomy  with sigmoid colectomy and creation of colostomy on 12/15. -Pain Control with Hydromorphone 1 mg q2hprn for Severe Pain; Robaxin 500 mg IV q8hprn for Muscle spasms, Morphine 4 mg IV q3hprn for Moderate Pain -C/w with Ambulation; Ondansetron for Nausea -C/w IV Zosyn or intraabdominal Infection;  -WBC Decreased down to 15.9 from 17.5 -CXR ordered by General Surgery and showed The lungs are reasonably well inflated. There small bilateral pleural effusions. The cardiac silhouette is top-normal in size. The pulmonary vascularity is not engorged. There is calcification in the wall of the thoracic aorta. The mediastinum is normal in width. There is mild degenerative disc disease at multiple thoracic spinal levels. -Urinalysis showed Cloudy Urine with Moderate Leukocytes, Negative Nitrites, and TNCT WBC's but patient is not complaining of Dysuria or burning and is Asymptomatic. -Repeat CBC in AM -Repeat CT Scan Done showed  No focal drainable fluid collection in the abdomen or pelvis, noting limited visualization of the deep pelvis due to streak artifact from right hip hardware.  Mildly dilated mid small bowel loops with air-fluid levels without discrete focal small bowel caliber transition, favor a mild adynamic ileus in the postoperative state. Tiny focus of free air deep to the umbilicus is within normal recent postoperative limits. Small bilateral pleural effusions, right greater than left. Trace perihepatic ascites. Mild anasarca. Mildly distended gallbladder filled with sludge and/or vicariously excreted IV contrast. No definite gallbladder wall thickening. -Wound Care Consult for Patient Education per General Surgery -Full Liquid Diet advanced to Soft Diet -Surgery added Simethicone 80 mg Chewable Tablet q4hprn for Flatulence -Appreciate General Surgery Evaluation and Further Recc's  Paroxysmal Atrial fibrillation with h/o Bradycardia -She is not on any  rate or rhythm control agent at home.  Patient on coumadin at home. INR of 2.96 on admission.   -Reversed coumadin with vitamin K 2mg  and  ffp prior to surgery on 12/15, -Heparin drip started on 12/18, hgb stable, no bleed from wound -Post op day one on 12/16, patient heart rate increased with intermittent afib, started on low dose iv lopressor, she is monitor in stepdown that day, later transferred to med tele. -Continue with Med Telemetry -Restarted Coumadin with Pharmacy to dose and Bridge with Heparin gtt  Hypokalemia:  -Improved. -Patient's Potassium was 3.6 -Replete as necessary -Repeat BMP in AM  Mild Hypernatremia, improved -Patient's Na+ was 144 this AM -Continue to Monitor; Advance diet to Soft Diet -Repeat BMP in AM  Hypothyroidism -D/C'd Synthroid 12.5mcg IV daily -Transitioned to 25 mcg po Daily   Hypertension -Bp Slightly elevated and SBP has been ranging from 158-163 DBP and 64-91; Currently BP was 139/64 -On low dose IV Lopressor at 2.5 mg IV q12h -C/w hydralazine 5 mg IV q6hprn for SBP >160  Severe malnutrition in context of chronic illness -Advanced Full Liquid to Soft Diet -Nutrition Consultation.   Indeterminate 1.6 cm posterior Right Liver Lobe Mass -Outpatient MRI of the Abdomen with and without IV contrast warranted for Further Evaluation.   DVT prophylaxis: Heparin gtt currently with Coumadin to resume today Code Status: DNR Family Communication: Discussed with family at bedside Disposition Plan: SNF  Consultants:   General Surgery  PCCM   Procedures:  s/p Exploratory laparotomy with sigmoid colectomy and creation of colostomy, pelvic drain placement, 12/25/15, Dr. Luisa Hartornett  - POD #7   Antimicrobials: IV Zosyn Day 9  Subjective: Patient was seen and examined this Am at bedside and she was ambulating well. Still complained of crampy gas pains. No N/V and tolerated full liquid diet well. No other concerns or complaints and states she would like to walk more.     Objective: Vitals:   12/31/15 1334 12/31/15 2138 01/01/16 0624 01/01/16 1500  BP: 139/64 135/65 (!) 148/73 138/70  Pulse: (!) 59 65 (!) 57 72  Resp: 16 16 16 16   Temp: 97.9 F (36.6 C) 97.7 F (36.5 C) 98.1 F (36.7 C) 98 F (36.7 C)  TempSrc: Oral Oral Oral Oral  SpO2: 98% 97% 97% 97%  Weight:   66.6 kg (146 lb 13.2 oz)   Height:        Intake/Output Summary (Last 24 hours) at 01/01/16 1937 Last data filed at 01/01/16 16101852  Gross per 24 hour  Intake          2529.18 ml  Output              980 ml  Net          1549.18 ml   Filed Weights   12/29/15 0537 12/30/15 0552 01/01/16 0624  Weight: 61.4 kg (135 lb 5.8 oz) 62.9 kg (138 lb 10.7 oz) 66.6 kg (146 lb 13.2 oz)    Examination: Physical Exam:  Constitutional: Elderly female who appears younger than stated age who is calm and comfortable in NAD Eyes: PERRL, lids and conjunctivae normal, sclerae anicteric  ENMT: External Ears, Nose appear normal. Grossly normal hearing. Neck: Appears normal, supple, no cervical masses, normal ROM, no appreciable thyromegaly Respiratory: Clear to auscultation bilaterally, no wheezing, rales, rhonchi or crackles. Normal respiratory effort and patient is not tachypenic. No accessory muscle use.  Cardiovascular: Regular Rhthym slightly brady, no murmurs / rubs / gallops. S1 and S2 auscultated.  No extremity edema Abdomen: Soft, non-tender, non-distended. No masses palpated. No appreciable hepatosplenomegaly. Bowel sounds positive. Colostomy in place with with more gas and output. Mid line incision appeared to have some dry blood and serous drainage. JP drain in place.  GU: Deferred. Musculoskeletal: No clubbing / cyanosis of digits/nails. No joint deformity upper and lower extremities. No contractures. Skin: No rashes, lesions, ulcers on limited skin evaluation. No induration; Warm and dry.  Neurologic: CN 2-12 grossly intact with no focal deficits. Sensation intact in all 4 Extremities.  Romberg sign cerebellar reflexes not assessed.  Psychiatric: Normal judgment and insight. Alert and oriented x 3. Normal and pleasant mood and appropriate affect.   Data Reviewed: I have personally reviewed following labs and imaging studies  CBC:  Recent Labs Lab 12/26/15 0321  12/28/15 0345 12/29/15 0449 12/30/15 0516 12/31/15 0501 12/31/15 0620 12/31/15 1230 01/01/16 0456  WBC 27.2*  < > 27.2* 16.3* 16.0* 14.9* 17.7* 17.5* 15.9*  NEUTROABS 25.9*  --  25.0* 14.2*  --   --   --  15.5*  --   HGB 12.1  < > 11.0* 11.6* 12.4 10.1* 12.2 12.0 11.4*  HCT 36.5  < > 34.1* 35.9* 39.0 31.3* 37.7 38.5 35.4*  MCV 88.2  < > 90.5 90.4 90.5 90.5 88.9 93.4 89.2  PLT 341  < > 415* 381 425* 369 422* 446* 423*  < > = values in this interval not displayed. Basic Metabolic Panel:  Recent Labs Lab 12/26/15 0321  12/28/15 0345 12/29/15 0449 12/30/15 0516 12/31/15 0620 01/01/16 0456  NA 139  < > 148* 149* 149* 148* 144  K 3.5  < > 3.8 4.1 3.8 3.5 3.6  CL 105  < > 115* 118* 116* 113* 111  CO2 24  < > 22 25 25 27 28   GLUCOSE 111*  < > 90 160* 111* 122* 130*  BUN 29*  < > 35* 28* 24* 22* 21*  CREATININE 1.01*  < > 0.79 0.83 0.86 0.72 0.78  CALCIUM 8.3*  < > 8.2* 8.3* 8.4* 8.3* 8.3*  MG 1.9  --  2.4  --  1.9  --  1.6*  PHOS  --   --   --   --   --   --  3.1  < > = values in this interval not displayed. GFR: Estimated Creatinine Clearance: 38.8 mL/min (by C-G formula based on SCr of 0.78 mg/dL). Liver Function Tests: No results for input(s): AST, ALT, ALKPHOS, BILITOT, PROT, ALBUMIN in the last 168 hours. No results for input(s): LIPASE, AMYLASE in the last 168 hours. No results for input(s): AMMONIA in the last 168 hours. Coagulation Profile:  Recent Labs Lab 12/26/15 0321  INR 1.26   Cardiac Enzymes: No results for input(s): CKTOTAL, CKMB, CKMBINDEX, TROPONINI in the last 168 hours. BNP (last 3 results) No results for input(s): PROBNP in the last 8760 hours. HbA1C: No results for  input(s): HGBA1C in the last 72 hours. CBG:  Recent Labs Lab 12/28/15 0746  GLUCAP 78   Lipid Profile: No results for input(s): CHOL, HDL, LDLCALC, TRIG, CHOLHDL, LDLDIRECT in the last 72 hours. Thyroid Function Tests: No results for input(s): TSH, T4TOTAL, FREET4, T3FREE, THYROIDAB in the last 72 hours. Anemia Panel: No results for input(s): VITAMINB12, FOLATE, FERRITIN, TIBC, IRON, RETICCTPCT in the last 72 hours. Sepsis Labs:  Recent Labs Lab 12/26/15 0321  LATICACIDVEN 0.6    Recent Results (from the past 240 hour(s))  Culture, Urine  Status: Abnormal   Collection Time: 12/24/15  8:50 AM  Result Value Ref Range Status   Specimen Description URINE, RANDOM  Final   Special Requests NONE  Final   Culture MULTIPLE SPECIES PRESENT, SUGGEST RECOLLECTION (A)  Final   Report Status 12/26/2015 FINAL  Final  Blood culture (routine x 2)     Status: None   Collection Time: 12/24/15  9:56 AM  Result Value Ref Range Status   Specimen Description BLOOD LEFT HAND  Final   Special Requests IN PEDIATRIC BOTTLE  Final   Culture   Final    NO GROWTH 5 DAYS Performed at [facility]    Report Status 12/29/2015 FINAL  Final  Blood culture (routine x 2)     Status: None   Collection Time: 12/24/15 10:15 AM  Result Value Ref Range Status   Specimen Description BLOOD LEFT ANTECUBITAL  Final   Special Requests BOTTLES DRAWN AEROBIC AND ANAEROBIC  Final   Culture   Final    NO GROWTH 5 DAYS Performed at [facility]    Report Status 12/29/2015 FINAL  Final  MRSA PCR Screening     Status: None   Collection Time: 12/25/15 10:30 PM  Result Value Ref Range Status   MRSA by PCR NEGATIVE NEGATIVE Final    Comment:        The GeneXpert MRSA Assay (FDA approved for NASAL specimens only), is one component of a comprehensive MRSA colonization surveillance program. It is not intended to diagnose MRSA infection nor to guide or monitor treatment for MRSA  infections.     Radiology Studies: Ct Abdomen Pelvis Wo Contrast  Result Date: 12/31/2015 CLINICAL DATA:  82 year old female inpatient admitted 12/25/2015 with small bowel obstruction, found to have perforated sigmoid diverticulitis, now postoperative day 6 status post exploratory laparotomy with sigmoid colectomy and colostomy, with increasing leukocytosis. EXAM: CT ABDOMEN AND PELVIS WITHOUT CONTRAST TECHNIQUE: Multidetector CT imaging of the abdomen and pelvis was performed following the standard protocol without IV contrast. COMPARISON:  12/24/2015 CT abdomen/ pelvis. FINDINGS: Lower chest: Small bilateral pleural effusions with compressive dependent lower lobe atelectasis, right greater than left. Coronary atherosclerosis. Hepatobiliary: Normal liver size. Stable 1.6 x 1.1 cm subcapsular liver mass in the right posterior liver lobe, isodense to the liver parenchyma (series 3/ image 15). No additional liver mass. Mildly distended gallbladder is filled with layering sludge and/ or vicarious excretion of recently administered IV contrast. No appreciable gallbladder wall thickening. No biliary ductal dilatation. Common bile duct diameter 6 mm. No radiopaque choledocholithiasis. Pancreas: Normal, with no mass or duct dilation. Spleen: Normal size. No mass. Adrenals/Urinary Tract: Normal adrenals. No hydronephrosis. No renal stones. No contour deforming renal mass. Poor visualization of the bladder due to streak artifact. Nondistended bladder. No appreciable bladder wall thickening. Focal gas in the anterior mid pelvis (series 3/image 70) may represent nondependent bladder lumen gas, although is poorly evaluated due to streak artifact. Stomach/Bowel: Stomach is mildly distended with oral contrast with no gastric wall thickening. There are several mildly dilated mid small bowel loops in the central abdomen with air-fluid levels measuring up to the 3.8 cm diameter. Oral contrast progresses to the mid small  bowel. No discrete small bowel caliber transition. No small bowel wall thickening or appreciable pneumatosis. Appendix is not discretely visualized. Interval subtotal sigmoid colectomy with end colostomy in the ventral left abdominal. Relatively collapsed large bowel. Blind-ending Hartmann's pouch in the deep pelvis at the level of the distal  sigmoid colon with residual diverticulosis in the remnant distal sigmoid colon. No rectal wall thickening. No wall thickening in the remnant large bowel. Vascular/Lymphatic: Atherosclerotic nonaneurysmal abdominal aorta. No pathologically enlarged lymph nodes in the abdomen or pelvis. Reproductive: Status post hysterectomy, with poor visualization of the vaginal cuff due to streak artifact. No adnexal mass. Other: Right lower quadrant percutaneous surgical drain courses in the deep pelvis in terminates in the right lower quadrant. No evidence of a focal drainable fluid collection noting limited visualization of the deep pelvis due to streak artifact. Trace perihepatic ascites. Generalized mesenteric edema. Tiny focus of free air in the midline ventral peritoneal cavity near the umbilicus. Midline vertical laparotomy wound. Mild anasarca. Musculoskeletal: No aggressive appearing focal osseous lesions. Partially visualized right total hip arthroplasty. Moderate thoracolumbar spondylosis. IMPRESSION: 1. No focal drainable fluid collection in the abdomen or pelvis, noting limited visualization of the deep pelvis due to streak artifact from right hip hardware. 2. Mildly dilated mid small bowel loops with air-fluid levels without discrete focal small bowel caliber transition, favor a mild adynamic ileus in the postoperative state. 3. Tiny focus of free air deep to the umbilicus is within normal recent postoperative limits. 4. Small bilateral pleural effusions, right greater than left. Trace perihepatic ascites. Mild anasarca. 5. Mildly distended gallbladder filled with sludge and/or  vicariously excreted IV contrast. No definite gallbladder wall thickening. 6. **An incidental finding of potential clinical significance has been found. Indeterminate 1.6 cm posterior right liver lobe mass, stable since 12/24/2015 CT. If clinically warranted, outpatient MRI of the abdomen without and with IV contrast is warranted for further characterization. This recommendation follows ACR consensus guidelines: Managing Incidental Findings on Abdominal CT: White Paper of the ACR Incidental Findings Committee. J Am Coll Radiol 2010;7:754-773.** 7. Aortic atherosclerosis.  Coronary atherosclerosis. Electronically Signed   By: Delbert Phenix M.D.   On: 12/31/2015 18:49   Dg Chest 2 View  Result Date: 12/31/2015 CLINICAL DATA:  Shortness of breath, elevated white blood cell count. History of atrial fibrillation, former smoker. EXAM: CHEST  2 VIEW COMPARISON:  Portable chest x-ray of April 28, 2014 FINDINGS: The lungs are reasonably well inflated. There small bilateral pleural effusions. The cardiac silhouette is top-normal in size. The pulmonary vascularity is not engorged. There is calcification in the wall of the thoracic aorta. The mediastinum is normal in width. There is mild degenerative disc disease at multiple thoracic spinal levels. IMPRESSION: COPD. New small bilateral pleural effusions. No alveolar pneumonia nor pulmonary edema. Thoracic aortic atherosclerosis. Electronically Signed   By: David  [place] M.D.   On: 12/31/2015 12:07   Scheduled Meds: . feeding supplement (GLUCERNA SHAKE)  237 mL Oral TID BM  . levothyroxine  12.5 mcg Intravenous QAC breakfast  . metoprolol  2.5 mg Intravenous Q12H  . pantoprazole (PROTONIX) IV  40 mg Intravenous Q24H  . piperacillin-tazobactam (ZOSYN)  IV  3.375 g Intravenous Q8H  . senna  1 tablet Oral Daily  . Warfarin - Pharmacist Dosing Inpatient   Does not apply q1800   Continuous Infusions: . dextrose 5% lactated ringers with KCl 20 mEq/L 75 mL/hr at  01/01/16 1823  . heparin 900 Units/hr (01/01/16 1617)    LOS: 8 days   Merlene Laughter, DO [facility] Pager [phone]  If 7PM-7AM, please contact night-coverage [email or web address] Password [facility] 01/01/2016, 7:37 PM

## 2016-01-01 NOTE — Consult Note (Signed)
WOC Nurse wound consult note Reason for Consult:Midline abdominal wound.  LLQ Colostomy.  Lives in assisted living and will go to short term rehab upon discharge.  Daughter is in room and states her mother may have difficulty managing ostomy care.  We will complete a pouch change today.  Daughters left the room.   WOC Nurse ostomy follow up Stoma type/location: LLQ Colostomy Stomal assessment/size: 2" edematous, moist and pink.  Producing soft brown stool in pouch.  Peristomal assessment: intact.  Barrier ring used today.  Barrier is cut off center to accommodate midline abdominal incision.  Treatment options for stomal/peristomal skin: barrier ring Output soft brown stool Ostomy pouching: 2pc. 2 3/4" pouch with barrier ring is continued today.    Education provided: Patient observed measuring, but was not able to cut opening.  Could not see well enough to do this, she reports.  Barrier applied and patient was able to roll closed with cueing.  Explained to empty when 1/3 full.  Twice weekly pouch changes.  Patient seems anxious regarding self care.  Emotional support provided.  HAs 4 pouches, barriers and rings at bedside.  Enrolled patient in [facility] Secure Start Discharge program: Yes WOC team will follow.  Maple HudsonKaren Rosaura Bolon RN BSN CWON Pager [phone]

## 2016-01-01 NOTE — Progress Notes (Signed)
Nutrition Brief Follow-Up note  Followed up on pt today regarding diet advancement. Met with pt in room. Pt reports improving appetite and eating 50% FL diet. NG tube removed. Pt advanced to soft diet today. Pt requests Glucerna because she feels it is lower is sugar and not as sweet.   Koleen Distance, RD, LDN Pager #- [phone]

## 2016-01-01 NOTE — Clinical Social Work Placement (Signed)
CSW following for discharge to [facility] when medically ready. [facility] Friday for potential dc Saturday [name] Laloni Rowton [facility] aware.   CSW confirmed with Tresa EndoKelly at [facility] that they would be able to take patient over the weekend if ready.   Please call weekend CSW (ph#: [phone]) to facilitate discharge.    Lincoln MaxinKelly Simi Briel, LCSW [facility] Clinical Social Worker cell #: [phone]   CLINICAL SOCIAL WORK PLACEMENT  NOTE  Date:  01/01/2016  Patient Details  Name: Candace Cain MRN: [id number] Date of Birth: Dec 19, 1920  Clinical Social Work is seeking post-discharge placement for this patient at the Skilled  Nursing Facility level of care (*CSW will initial, date and re-position this form in  chart as items are completed):  Yes   Patient/family provided with [facility] Clinical Social Work Department's list of facilities offering this level of care within the geographic area requested by the patient (or if unable, by the patient's family).  Yes   Patient/family informed of their freedom to choose among providers that offer the needed level of care, that participate in Medicare, Medicaid or managed care program needed by the patient, have an available bed and are willing to accept the patient.  Yes   Patient/family informed of [facility]'s ownership interest in [facility] and [facility], as well as of the fact that they are under no obligation to receive care at these facilities.  PASRR submitted to EDS on 12/28/15     PASRR number received on 12/28/15     Existing PASRR number confirmed on       FL2 transmitted to all facilities in geographic area requested by pt/family on 12/28/15     FL2 transmitted to all facilities within larger geographic area on       Patient informed that his/her managed care company has contracts with or will negotiate with certain facilities, including the following:         Yes   Patient/family informed of bed offers received.  [facility]     Physician recommends and patient chooses bed at      [facility] on  .  Patient to be transferred to facility by       Patient family notified on   of transfer.  Name of family member notified:        PHYSICIAN       Additional Comment:    _______________________________________________ Arlyss RepressHarrison, Jacalyn Biggs F, LCSW 01/01/2016, 3:47 PM

## 2016-01-01 NOTE — Progress Notes (Signed)
Pharmacy - IV heparin  Assessment:    Please see note from Arley PhenixEllen Cain, PharmD earlier today for full details.  Briefly, 82 y.o. female on IV heparin (and now warfarin) for Afib   Most recent heparin level remains SUBtherapeutic at 0.25 on 800 units/hr after recent increase  Note level was previously high on this same rate yesterday; redrawn due to concern for improper lab draw and came back lower but still above goal range.   No bleeding or line issues per RN  Plan:   Will increase heparin conservatively to 900 units/hr  Recheck heparin level with AM labs (~12 hrs after rate change) - patient seems slow to reach steady state kinetics  Candace Personrew Michae Grimley, PharmD, BCPS Pager: [phone] 01/01/2016, 3:58 PM

## 2016-01-01 NOTE — Progress Notes (Signed)
ANTICOAGULATION CONSULT NOTE - Initial Consult  Pharmacy Consult for warfarin Indication: atrial fibrillation  No Known Allergies  Patient Measurements: Height: 5' 1.5" (156.2 cm) Weight: 146 lb 13.2 oz (66.6 kg) IBW/kg (Calculated) : 48.95   Vital Signs: Temp: 98.1 F (36.7 C) (12/22 0624) Temp Source: Oral (12/22 0624) BP: 148/73 (12/22 0624) Pulse Rate: 57 (12/22 0624)  Labs:  Recent Labs  12/30/15 0516  12/31/15 0501 12/31/15 0620 12/31/15 1230 01/01/16 0456  HGB 12.4  --  10.1* 12.2 12.0 11.4*  HCT 39.0  --  31.3* 37.7 38.5 35.4*  PLT 425*  --  369 422* 446* 423*  HEPARINUNFRC  --   < > >2.20*  --  1.51* 0.10*  CREATININE 0.86  --   --  0.72  --  0.78  < > = values in this interval not displayed.  Estimated Creatinine Clearance: 38.8 mL/min (by C-G formula based on SCr of 0.78 mg/dL).   Medical History: Past Medical History:  Diagnosis Date  . Alopecia   . Atrial fibrillation (HCC)   . Bradycardia   . Unspecified essential hypertension      Assessment: 82 yo female admitted with Diverticulitis of colon with perforation s/p colectomy/colostomy 12/25/2015.  PTA warfarin for paroxysmal A Fib, INR supratherapeutic on admission, IV vitamin K and PRBC's given on 12/15. Pt currently on heparin drip for AFib, pharmacy consulted to resume warfarin dosing.  01/01/2016 Home dose warfarin 2.5mg  po daily INR on 12/16=1.26 H/H stable Pt 's appetite improving and eating ~ 50% of meals No drug interactions noted  Goal of Therapy:  INR 2-3 Monitor platelets by anticoagulation protocol: Yes    Plan:  Warfarin 3.5mg  po x 1 at 1800 Daily INR Continue heparin drip, level check at 1500 today   Arley PhenixEllen Grover Robinson RPh 01/01/2016, 1:50 PM Pager [phone]

## 2016-01-01 NOTE — Progress Notes (Signed)
ANTICOAGULATION CONSULT NOTE - Follow Up Consult  Pharmacy Consult for Heparin Indication: atrial fibrillation  No Known Allergies  Patient Measurements: Height: 5' 1.5" (156.2 cm) Weight: 138 lb 10.7 oz (62.9 kg) IBW/kg (Calculated) : 48.95 Heparin Dosing Weight:   Vital Signs: Temp: 97.7 F (36.5 C) (12/21 2138) Temp Source: Oral (12/21 2138) BP: 135/65 (12/21 2138) Pulse Rate: 65 (12/21 2138)  Labs:  Recent Labs  12/30/15 0516  12/31/15 0501 12/31/15 0620 12/31/15 1230 01/01/16 0456  HGB 12.4  --  10.1* 12.2 12.0 11.4*  HCT 39.0  --  31.3* 37.7 38.5 35.4*  PLT 425*  --  369 422* 446* 423*  HEPARINUNFRC  --   < > >2.00*  --  1.51* 0.10*  CREATININE 0.86  --   --  0.72  --  0.78  < > = values in this interval not displayed.  Estimated Creatinine Clearance: 37.9 mL/min (by C-G formula based on SCr of 0.78 mg/dL).   Medications:  Infusions:  . dextrose 5% lactated ringers with KCl 20 mEq/L 75 mL/hr at 12/31/15 1838  . heparin 700 Units/hr (12/31/15 1833)    Assessment: Patient with heparin level low.  RN confirmed no issues with heparin drip.  RN also noted same amount/type of discharge from midline incision.  Goal of Therapy:  Heparin level 0.3-0.7 units/ml Monitor platelets by anticoagulation protocol: Yes   Plan:  Increase heparin to 800 units/hr Recheck level at [address]1500  Jehieli Brassell Jr, PiercetonJulian Crowford 01/01/2016,6:08 AM

## 2016-01-01 NOTE — Progress Notes (Signed)
Physical Therapy Treatment Patient Details Name: Cheri Fowlerlva Pries MRN: [id number] DOB: 12/08/1920 Today's Date: 01/01/2016    History of Present Illness [name] is a 82 y/o woman with a hx of PAF on coumadin who presented with abdominal pain and perforated diverticulitis who underwent ex-lap with repair and ostomy.She  has a past medical history of Alopecia; Atrial fibrillation (HCC); Bradycardia; and Unspecified essential hypertension., and R hip hemiarthroplasty.    PT Comments    Pt ambulated in hallway and able to tolerate small increase in distance this morning.  Follow Up Recommendations  SNF     Equipment Recommendations  None recommended by PT    Recommendations for Other Services       Precautions / Restrictions Precautions Precautions: Fall Precaution Comments: colostomy Restrictions Weight Bearing Restrictions: No    Mobility  Bed Mobility   Bed Mobility: Rolling;Sidelying to Sit Rolling: Supervision Sidelying to sit: Min assist       General bed mobility comments: provided hand for pt to pull trunk upright  Transfers Overall transfer level: Needs assistance Equipment used: Rolling walker (2 wheeled) Transfers: Sit to/from Stand Sit to Stand: Min guard         General transfer comment: verbal cues for safe technique  Ambulation/Gait Ambulation/Gait assistance: Min guard Ambulation Distance (Feet): 180 Feet Assistive device: Rolling walker (2 wheeled) Gait Pattern/deviations: Step-through pattern;Decreased stride length;Trunk flexed     General Gait Details: verbal cues for posture, steady with RW   Stairs            Wheelchair Mobility    Modified Rankin (Stroke Patients Only)       Balance                                    Cognition Arousal/Alertness: Awake/alert Behavior During Therapy: WFL for tasks assessed/performed Overall Cognitive Status: Within Functional Limits for tasks assessed                       Exercises      General Comments        Pertinent Vitals/Pain Pain Assessment: No/denies pain    Home Living                      Prior Function            PT Goals (current goals can now be found in the care plan section) Progress towards PT goals: Progressing toward goals    Frequency    Min 3X/week      PT Plan Current plan remains appropriate    Co-evaluation             End of Session   Activity Tolerance: Patient tolerated treatment well Patient left: in chair;with call bell/phone within reach;with family/visitor present     Time: [id number] PT Time Calculation (min) (ACUTE ONLY): 15 min  Charges:  $Gait Training: 8-22 mins                    G Codes:      Chi Garlow,KATHrine E 01/01/2016, 12:48 PM Zenovia JarredKati Tierra Thoma, PT, DPT 01/01/2016 Pager: [phone]

## 2016-01-01 NOTE — Progress Notes (Signed)
Patient ID: Candace Cain, female   DOB: [birth year], 82 y.o.   MRN: [id number]  [facility]  7 Days Post-Op  Subjective: Some persistent intermittent crampy abdominal pain, patient attributes this to gas pain. CT scan yesterday with no concern for intraabdominal infection. She is tolerating full liquid diet. Denies n/v.   Objective: Vital signs in last 24 hours: Temp:  [97.7 F (36.5 C)-98.1 F (36.7 C)] 98.1 F (36.7 C) (12/22 0624) Pulse Rate:  [57-65] 57 (12/22 0624) Resp:  [16] 16 (12/22 0624) BP: (135-148)/(64-73) 148/73 (12/22 0624) SpO2:  [97 %-98 %] 97 % (12/22 0624) Weight:  [146 lb 13.2 oz (66.6 kg)] 146 lb 13.2 oz (66.6 kg) (12/22 0624) Last BM Date: 12/30/15 (colostomy)  Intake/Output from previous day: 12/21 0701 - 12/22 0700 In: 2341.3 [P.O.:220; I.V.:1971.3; IV Piggyback:150] Out: 730 [Urine:475; Drains:255] Intake/Output this shift: Total I/O In: -  Out: 150 [Urine:150]  PE: Gen:  Alert, NAD, pleasant and cooperative Pulm:  CTAB, diminished breath sounds in bilateral lung bases, no wheezes, effort normal Abd: Soft, appropriately tender, mild distention, +BS, midline incision healing well with trace serous drainage at base, Colostomy pouch with air and liquid brown stool in bag  Lab Results:   Recent Labs  12/31/15 1230 01/01/16 0456  WBC 17.5* 15.9*  HGB 12.0 11.4*  HCT 38.5 35.4*  PLT 446* 423*   BMET  Recent Labs  12/31/15 0620 01/01/16 0456  NA 148* 144  K 3.5 3.6  CL 113* 111  CO2 27 28  GLUCOSE 122* 130*  BUN 22* 21*  CREATININE 0.72 0.78  CALCIUM 8.3* 8.3*   PT/INR No results for input(s): LABPROT, INR in the last 72 hours. CMP     Component Value Date/Time   NA 144 01/01/2016 0456   K 3.6 01/01/2016 0456   CL 111 01/01/2016 0456   CO2 28 01/01/2016 0456   GLUCOSE 130 (H) 01/01/2016 0456   BUN 21 (H) 01/01/2016 0456   CREATININE 0.78 01/01/2016 0456   CALCIUM 8.3 (L) 01/01/2016 0456   PROT 7.2  12/24/2015 0850   ALBUMIN 3.7 12/24/2015 0850   AST 24 12/24/2015 0850   ALT 16 12/24/2015 0850   ALKPHOS 106 12/24/2015 0850   BILITOT 0.9 12/24/2015 0850   GFRNONAA >60 01/01/2016 0456   GFRAA >60 01/01/2016 0456   Lipase     Component Value Date/Time   LIPASE 19 12/24/2015 0850       Studies/Results: Ct Abdomen Pelvis Wo Contrast  Result Date: 12/31/2015 CLINICAL DATA:  82 year old female inpatient admitted 12/25/2015 with small bowel obstruction, found to have perforated sigmoid diverticulitis, now postoperative day 6 status post exploratory laparotomy with sigmoid colectomy and colostomy, with increasing leukocytosis. EXAM: CT ABDOMEN AND PELVIS WITHOUT CONTRAST TECHNIQUE: Multidetector CT imaging of the abdomen and pelvis was performed following the standard protocol without IV contrast. COMPARISON:  12/24/2015 CT abdomen/ pelvis. FINDINGS: Lower chest: Small bilateral pleural effusions with compressive dependent lower lobe atelectasis, right greater than left. Coronary atherosclerosis. Hepatobiliary: Normal liver size. Stable 1.6 x 1.1 cm subcapsular liver mass in the right posterior liver lobe, isodense to the liver parenchyma (series 3/ image 15). No additional liver mass. Mildly distended gallbladder is filled with layering sludge and/ or vicarious excretion of recently administered IV contrast. No appreciable gallbladder wall thickening. No biliary ductal dilatation. Common bile duct diameter 6 mm. No radiopaque choledocholithiasis. Pancreas: Normal, with no mass or duct dilation. Spleen: Normal size. No mass. Adrenals/Urinary Tract: Normal  adrenals. No hydronephrosis. No renal stones. No contour deforming renal mass. Poor visualization of the bladder due to streak artifact. Nondistended bladder. No appreciable bladder wall thickening. Focal gas in the anterior mid pelvis (series 3/image 70) may represent nondependent bladder lumen gas, although is poorly evaluated due to streak  artifact. Stomach/Bowel: Stomach is mildly distended with oral contrast with no gastric wall thickening. There are several mildly dilated mid small bowel loops in the central abdomen with air-fluid levels measuring up to the 3.8 cm diameter. Oral contrast progresses to the mid small bowel. No discrete small bowel caliber transition. No small bowel wall thickening or appreciable pneumatosis. Appendix is not discretely visualized. Interval subtotal sigmoid colectomy with end colostomy in the ventral left abdominal. Relatively collapsed large bowel. Blind-ending Hartmann's pouch in the deep pelvis at the level of the distal sigmoid colon with residual diverticulosis in the remnant distal sigmoid colon. No rectal wall thickening. No wall thickening in the remnant large bowel. Vascular/Lymphatic: Atherosclerotic nonaneurysmal abdominal aorta. No pathologically enlarged lymph nodes in the abdomen or pelvis. Reproductive: Status post hysterectomy, with poor visualization of the vaginal cuff due to streak artifact. No adnexal mass. Other: Right lower quadrant percutaneous surgical drain courses in the deep pelvis in terminates in the right lower quadrant. No evidence of a focal drainable fluid collection noting limited visualization of the deep pelvis due to streak artifact. Trace perihepatic ascites. Generalized mesenteric edema. Tiny focus of free air in the midline ventral peritoneal cavity near the umbilicus. Midline vertical laparotomy wound. Mild anasarca. Musculoskeletal: No aggressive appearing focal osseous lesions. Partially visualized right total hip arthroplasty. Moderate thoracolumbar spondylosis. IMPRESSION: 1. No focal drainable fluid collection in the abdomen or pelvis, noting limited visualization of the deep pelvis due to streak artifact from right hip hardware. 2. Mildly dilated mid small bowel loops with air-fluid levels without discrete focal small bowel caliber transition, favor a mild adynamic ileus  in the postoperative state. 3. Tiny focus of free air deep to the umbilicus is within normal recent postoperative limits. 4. Small bilateral pleural effusions, right greater than left. Trace perihepatic ascites. Mild anasarca. 5. Mildly distended gallbladder filled with sludge and/or vicariously excreted IV contrast. No definite gallbladder wall thickening. 6. **An incidental finding of potential clinical significance has been found. Indeterminate 1.6 cm posterior right liver lobe mass, stable since 12/24/2015 CT. If clinically warranted, outpatient MRI of the abdomen without and with IV contrast is warranted for further characterization. This recommendation follows ACR consensus guidelines: Managing Incidental Findings on Abdominal CT: White Paper of the ACR Incidental Findings Committee. J Am Coll Radiol 2010;7:754-773.** 7. Aortic atherosclerosis.  Coronary atherosclerosis. Electronically Signed   By: Delbert PhenixJason A Poff M.D.   On: 12/31/2015 18:49   Dg Chest 2 View  Result Date: 12/31/2015 CLINICAL DATA:  Shortness of breath, elevated white blood cell count. History of atrial fibrillation, former smoker. EXAM: CHEST  2 VIEW COMPARISON:  Portable chest x-ray of April 28, 2014 FINDINGS: The lungs are reasonably well inflated. There small bilateral pleural effusions. The cardiac silhouette is top-normal in size. The pulmonary vascularity is not engorged. There is calcification in the wall of the thoracic aorta. The mediastinum is normal in width. There is mild degenerative disc disease at multiple thoracic spinal levels. IMPRESSION: COPD. New small bilateral pleural effusions. No alveolar pneumonia nor pulmonary edema. Thoracic aortic atherosclerosis. Electronically Signed   By: David  SwazilandJordan M.D.   On: 12/31/2015 12:07    Anti-infectives: Anti-infectives    Start  Dose/Rate Route Frequency Ordered Stop   12/24/15 1800  piperacillin-tazobactam (ZOSYN) IVPB 3.375 g     3.375 g 12.5 mL/hr over 240 Minutes  Intravenous Every 8 hours 12/24/15 1016     12/24/15 1015  piperacillin-tazobactam (ZOSYN) IVPB 3.375 g     3.375 g 100 mL/hr over 30 Minutes Intravenous  Once 12/24/15 1013 12/24/15 1101       Assessment/Plan Perforated sigmoid diverticulitis with peritonitis and small bowel obstruction S/p Exploratory laparotomy with sigmoid colectomy and creation of colostomy, pelvic drain placement, 12/25/15, Dr. Luisa Hart  - POD #7 - on 12/21 there was concern for rising leukocytosis. CXR WNL and patient with no UTI signs/symptoms - CT 12/21 showed no concern for leak or abscess, likely a small adynamic ileus  - today WBC trending down 15.9 from 17.7, afebrile - stool and air in colostomy pouch today   Atrial fibrillation on coumadin at home  Hypertension Hypothyroid - synthroid Hx of tobacco use   FEN: soft diet, Glucerna ID: Day [id number] DVT: Heparin drip, Ok to restart coumadin from surgical standpoint.  Plan: Leukocytosis improving and CT scan yesterday showed adynamic ileus and no concern for intraabdominal infection. Advance to soft diet and continue Glucerna. Try simethicone for gas pains. Continue to encourage ambulation and IS. Continue therapies. Ok to restart coumadin from surgical standpoint.   LOS: 8 days    Edson Snowball , [facility] 01/01/2016, 10:49 AM Pager: [phone] Consults: [phone] Mon-Fri 7:00 am-4:30 pm Sat-Sun 7:00 am-11:30 am

## 2016-01-02 ENCOUNTER — Inpatient Hospital Stay (HOSPITAL_COMMUNITY): Payer: Medicare Other

## 2016-01-02 DIAGNOSIS — R791 Abnormal coagulation profile: Secondary | ICD-10-CM

## 2016-01-02 LAB — COMPREHENSIVE METABOLIC PANEL
ALK PHOS: 61 U/L (ref 38–126)
ALT: 20 U/L (ref 14–54)
ANION GAP: 6 (ref 5–15)
AST: 33 U/L (ref 15–41)
Albumin: 2.2 g/dL — ABNORMAL LOW (ref 3.5–5.0)
BUN: 19 mg/dL (ref 6–20)
CALCIUM: 8.2 mg/dL — AB (ref 8.9–10.3)
CHLORIDE: 112 mmol/L — AB (ref 101–111)
CO2: 27 mmol/L (ref 22–32)
CREATININE: 0.73 mg/dL (ref 0.44–1.00)
Glucose, Bld: 121 mg/dL — ABNORMAL HIGH (ref 65–99)
Potassium: 4 mmol/L (ref 3.5–5.1)
SODIUM: 145 mmol/L (ref 135–145)
Total Bilirubin: 0.5 mg/dL (ref 0.3–1.2)
Total Protein: 4.8 g/dL — ABNORMAL LOW (ref 6.5–8.1)

## 2016-01-02 LAB — CBC
HCT: 33.1 % — ABNORMAL LOW (ref 36.0–46.0)
Hemoglobin: 10.1 g/dL — ABNORMAL LOW (ref 12.0–15.0)
MCH: 27.2 pg (ref 26.0–34.0)
MCHC: 30.5 g/dL (ref 30.0–36.0)
MCV: 89.2 fL (ref 78.0–100.0)
PLATELETS: 433 10*3/uL — AB (ref 150–400)
RBC: 3.71 MIL/uL — AB (ref 3.87–5.11)
RDW: 14.8 % (ref 11.5–15.5)
WBC: 17 10*3/uL — AB (ref 4.0–10.5)

## 2016-01-02 LAB — MAGNESIUM: Magnesium: 1.6 mg/dL — ABNORMAL LOW (ref 1.7–2.4)

## 2016-01-02 LAB — PROTIME-INR
INR: 8.96 — AB
Prothrombin Time: 76.3 seconds — ABNORMAL HIGH (ref 11.4–15.2)

## 2016-01-02 LAB — PHOSPHORUS: PHOSPHORUS: 3 mg/dL (ref 2.5–4.6)

## 2016-01-02 LAB — HEPARIN LEVEL (UNFRACTIONATED): HEPARIN UNFRACTIONATED: 0.51 [IU]/mL (ref 0.30–0.70)

## 2016-01-02 MED ORDER — SILVER NITRATE-POT NITRATE 75-25 % EX MISC
10.0000 | Freq: Once | CUTANEOUS | Status: AC
  Administered 2016-01-02: 10 via TOPICAL
  Filled 2016-01-02: qty 10

## 2016-01-02 MED ORDER — MAGNESIUM SULFATE 2 GM/50ML IV SOLN
2.0000 g | Freq: Once | INTRAVENOUS | Status: AC
  Administered 2016-01-02: 2 g via INTRAVENOUS
  Filled 2016-01-02: qty 50

## 2016-01-02 MED ORDER — PHYTONADIONE 5 MG PO TABS
2.5000 mg | ORAL_TABLET | Freq: Once | ORAL | Status: AC
  Administered 2016-01-02: 2.5 mg via ORAL
  Filled 2016-01-02: qty 1

## 2016-01-02 NOTE — Progress Notes (Signed)
ANTICOAGULATION CONSULT NOTE -   Pharmacy Consult for heparin/warfarin Indication: atrial fibrillation  No Known Allergies  Patient Measurements: Height: 5' 1.5" (156.2 cm) Weight: 146 lb 13.2 oz (66.6 kg) IBW/kg (Calculated) : 48.95 Heparin Dosing Weight: 57.6kg  Vital Signs: Temp: 98.4 F (36.9 C) (12/23 0600) Temp Source: Oral (12/23 0600) BP: 140/82 (12/23 0600) Pulse Rate: 71 (12/23 0600)  Labs:  Recent Labs  12/31/15 0620 12/31/15 1230 01/01/16 0456 01/01/16 1515 01/02/16 0521 01/02/16 0938  HGB 12.2 12.0 11.4*  --  10.1*  --   HCT 37.7 38.5 35.4*  --  33.1*  --   PLT 422* 446* 423*  --  433*  --   LABPROT  --   --   --   --   --  76.3*  INR  --   --   --   --   --  8.96*  HEPARINUNFRC  --  1.51* 0.10* 0.25* 0.51  --   CREATININE 0.72  --  0.78  --  0.73  --     Estimated Creatinine Clearance: 38.8 mL/min (by C-G formula based on SCr of 0.73 mg/dL).   Medical History: Past Medical History:  Diagnosis Date  . Alopecia   . Atrial fibrillation (HCC)   . Bradycardia   . Unspecified essential hypertension      Assessment: 82 yo female admitted with Diverticulitis of colon with perforation s/p colectomy/colostomy 12/25/2015.  PTA warfarin for paroxysmal A Fib, INR supratherapeutic on admission, IV vitamin K and PRBC's given on 12/15. Pharmacy consulted to dose heparin drip and warfarin for AFib.  Home warfarin regiment 2.5mg  po daily 12/16 INR= 1.26  01/02/2016  HL therapeutic   INR 8.9   Surgery noted small vessels oozing on wound and controlled with silver nitrate  H/H decreased, Plts elevated  No bleeding or infusion-related issues reported per RN.  No drug interactions ntoed  Pt 's appetite improving and eating ~ 50% of meals    Goal of Therapy:  Heparin level 0.3-0.7 units/ml  INR 2-3 Monitor platelets by anticoagulation protocol  Plan:  Vitamin K po 2.5mg  x 1 per MD Continue Heparin drip at 800 units/hr Hold warfarin  tonight Daily INR, CBC and heparin level    Arley PhenixEllen Kc Summerson RPh 01/02/2016, 11:56 AM Pager [phone]

## 2016-01-02 NOTE — Progress Notes (Signed)
PROGRESS NOTE    Candace Cain  ZOX:[id number] DOB: 10-09-1920 DOA: 12/24/2015 PCP: Duane Lope, MD   Brief Narrative:  Candace Cain is a 82 y.o. female with medical history significant of atrial fibrillation, hypertension, hypothyroidism. She states that about 1 week ago she started having some loose and watery stools. She took some Imodium which helped with her loose stools because her to be constipated. At that time, she started having abdominal pain. She started taking a stool softener over-the-counter to help with her constipation which helped. Her abdominal pain continued to worsen over the next few days and she started developing nausea. Her pain was so severe last night that she fell she needs to be evaluated in the [facility] department. Her last bowel movement was 4 days ago and her last time passing gas was 3 days ago. Found to have a perforated sigmoid diverticulitis with peritonitis and SBO s/p Exploratory laparotomy with Colostomy and drain placement 12/25/2015. She had an elevation in her WBC however she is afebrile and not complaining of any symptoms except that she had some bleeding this Am from her wound that had to be cauterized with silver nitrate. Patient tolerating po well and Diet has been advance. Coumadin was given last night and INR was 8.96 this AM so Vitamin K was given and Warfarin will be held tonight.  Assessment & Plan:   Principal Problem:   Diverticulitis of colon with perforation s/p colectomy/colostomy 12/25/2015 Active Problems:   Essential hypertension   Atrial fibrillation (HCC)   Hypothyroidism   Small bowel obstruction   Protein-calorie malnutrition, severe   Perforated bowel (HCC)   Diverticulitis of large intestine with abscess without bleeding   Hypokalemia  Perforated sigmoid diverticulitis with peritonitis and small bowel obstruction s/p Exploratory Laparotomy with sigmoid colectomy and colostomy creation POD8 -s/p Exploratory laparotomy with sigmoid  colectomy and creation of colostomy on 12/15. -Pain Control with Hydromorphone 1 mg q2hprn for Severe Pain; Robaxin 500 mg IV q8hprn for Muscle spasms, Morphine 4 mg IV q3hprn for Moderate Pain -C/w with Ambulation; Ondansetron for Nausea -C/w IV Zosyn or intraabdominal Infection for 1-2 more days;  -WBC Increased from 15.9 to 17.0 -CXR ordered by General Surgery and showed The lungs are reasonably well inflated. There small bilateral pleural effusions. The cardiac silhouette is top-normal in size. The pulmonary vascularity is not engorged. There is calcification in the wall of the thoracic aorta. The mediastinum is normal in width. There is mild degenerative disc disease at multiple thoracic spinal levels. -Urinalysis showed Cloudy Urine with Moderate Leukocytes, Negative Nitrites, and TNCT WBC's but patient is not complaining of Dysuria or burning and is Asymptomatic. -Repeat CT Scan Done showed  No focal drainable fluid collection in the abdomen or pelvis, noting limited visualization of the deep pelvis due to streak artifact from right hip hardware.  Mildly dilated mid small bowel loops with air-fluid levels without discrete focal small bowel caliber transition, favor a mild adynamic ileus in the postoperative state. Tiny focus of free air deep to the umbilicus is within normal recent postoperative limits. Small bilateral pleural effusions, right greater than left. Trace perihepatic ascites. Mild anasarca. Mildly distended gallbladder filled with sludge and/or vicariously excreted IV contrast. No definite gallbladder wall thickening. -General Surgery Again repeated CXR and Urinalysis (pending) -CXR 01/02/16 showed Persistent bibasilar opacification compatible with small effusions and associated atelectasis. Aortic atherosclerosis. -Discussed with Dr. Luciana Axe in Infectious Disease and after reviewing the case states to continue to Monitor for S/Sx of Infection and  is not concerned with WBC increasing  after reviewing CT Scan of Abdomen. Recommended Discontinuing IV Abx in next day or 2. -Repeat CBC in AM -Wound Care Consult for Patient Education per General Surgery -C/w Soft Diet -Surgery added Simethicone 80 mg Chewable Tablet q4hprn for Flatulence -Appreciate General Surgery Evaluation and Further Recc's  Paroxysmal Atrial fibrillation with h/o Bradycardia -She is not on any rate or rhythm control agent at home. Patient on coumadin at home. INR of 2.96 on admission.  repeat INR Today was 8.96 after 1 dose of 3.5 mg of Coumadin -Reversed coumadin with vitamin K 2.5 mg today;  -Had Prior Reversal with 2 mg of Vitamin K and and  ffp prior to surgery on 12/15, -Heparin drip started on 12/18, hgb slightly decreased and patient was bleeding from incision site which was cauterized by Dr. Carolynne Edouardoth this Am.  -Hb/Hct Dropped from 11.4/35.4 -> 10.1/33.1 -Post op day one on 12/16, patient heart rate increased with intermittent afib, started on low dose iv lopressor, she is monitor in stepdown that day, later transferred to med tele. -Continue with Med Telemetry -Hold Coumadin tonight with Pharmacy to dose and Bridge with Heparin gtt as INR was 8.96 and SUPRATHERAPEUTIC -Repeat PT-INR in AM as well as CBC  Hypokalemia:  -Improved. -Patient's Potassium was 4.0 -Replete as necessary -Repeat BMP in AM  Mild Hypernatremia, improved -Patient's Na+ was 145 this AM -Continue to Monitor; C/w Soft Diet -Repeat BMP in AM  Hypothyroidism -C/w Levothyroxine 25 mcg po Daily   Hypertension -Bp Slightly elevated and SBP has been ranging from 138-169 DBP and 70-87; Currently BP was 140/82 -On low dose IV Lopressor at 2.5 mg IV q12h -C/w hydralazine 5 mg IV q6hprn for SBP >160  Severe malnutrition in context of chronic illness -C/w Soft Diet -Nutrition Consultation.   Indeterminate 1.6 cm posterior Right Liver Lobe Mass -Outpatient MRI of the Abdomen with and without IV contrast warranted for  Further Evaluation.   DVT prophylaxis: Heparin gtt currently; Coumadin to be Held because of Supratherapeutic INR Code Status: DNR Family Communication: Discussed with family at bedside Disposition Plan: SNF when stable to be D/C'd  Consultants:   General Surgery  PCCM   Procedures:  s/p Exploratory laparotomy with sigmoid colectomy and creation of colostomy, pelvic drain placement, 12/25/15, Dr. Luisa Hartornett  - POD #8   Antimicrobials: IV Zosyn Day10  Subjective: Patient was seen and examined this Am and states she was feeling well. Had some bleeding from incision site that was cauterized with silver nitrate by Dr. Carolynne Edouardoth. Patient stated that had burned. Tolerating diet and no Nausea. States her gassy crampy pain is better with Simethicone. No other complaints or concerns.    Objective: Vitals:   01/01/16 1500 01/01/16 2128 01/01/16 2258 01/02/16 0600  BP: 138/70 (!) 169/87  140/82  Pulse: 72 64  71  Resp: 16 16  16   Temp: 98 F (36.7 C) 98.5 F (36.9 C)  98.4 F (36.9 C)  TempSrc: Oral Oral  Oral  SpO2: 97% 96%  96%  Weight:   66.6 kg (146 lb 13.2 oz)   Height:        Intake/Output Summary (Last 24 hours) at 01/02/16 1500 Last data filed at 01/02/16 0917  Gross per 24 hour  Intake          1708.45 ml  Output              930 ml  Net  778.45 ml   Filed Weights   12/30/15 0552 01/01/16 0624 01/01/16 2258  Weight: 62.9 kg (138 lb 10.7 oz) 66.6 kg (146 lb 13.2 oz) 66.6 kg (146 lb 13.2 oz)    Examination: Physical Exam:  Constitutional: Elderly female who appears younger than stated age who is calm and comfortable in NAD Eyes: PERRL, lids and conjunctivae normal, sclerae anicteric  ENMT: External Ears, Nose appear normal. Grossly normal hearing. Neck: Appears normal, supple, no cervical masses, normal ROM, no appreciable thyromegaly Respiratory: Clear to auscultation bilaterally, no wheezing, rales, rhonchi or crackles. Normal respiratory effort and patient is  not tachypenic. No accessory muscle use.  Cardiovascular: Regular Rhthym slightly brady, no murmurs / rubs / gallops. S1 and S2 auscultated. No extremity edema Abdomen: Soft, non-tender, non-distended. No masses palpated. No appreciable hepatosplenomegaly. Bowel sounds positive. Colostomy in place with with more gas and output today. Mid line incision bandage looked C/D/I. JP drain in place.  GU: Deferred. Musculoskeletal: No clubbing / cyanosis of digits/nails. No joint deformity upper and lower extremities. No contractures. Skin: No rashes, lesions, ulcers on limited skin evaluation. No induration; Warm and dry.  Neurologic: CN 2-12 grossly intact with no focal deficits. Sensation intact in all 4 Extremities. Romberg sign cerebellar reflexes not assessed.  Psychiatric: Normal judgment and insight. Alert and oriented x 3. Normal and pleasant mood and appropriate affect.   Data Reviewed: I have personally reviewed following labs and imaging studies  CBC:  Recent Labs Lab 12/28/15 0345 12/29/15 0449  12/31/15 0501 12/31/15 0620 12/31/15 1230 01/01/16 0456 01/02/16 0521  WBC 27.2* 16.3*  < > 14.9* 17.7* 17.5* 15.9* 17.0*  NEUTROABS 25.0* 14.2*  --   --   --  15.5*  --   --   HGB 11.0* 11.6*  < > 10.1* 12.2 12.0 11.4* 10.1*  HCT 34.1* 35.9*  < > 31.3* 37.7 38.5 35.4* 33.1*  MCV 90.5 90.4  < > 90.5 88.9 93.4 89.2 89.2  PLT 415* 381  < > 369 422* 446* 423* 433*  < > = values in this interval not displayed. Basic Metabolic Panel:  Recent Labs Lab 12/28/15 0345 12/29/15 0449 12/30/15 0516 12/31/15 0620 01/01/16 0456 01/02/16 0521  NA 148* 149* 149* 148* 144 145  K 3.8 4.1 3.8 3.5 3.6 4.0  CL 115* 118* 116* 113* 111 112*  CO2 22 25 25 27 28 27   GLUCOSE 90 160* 111* 122* 130* 121*  BUN 35* 28* 24* 22* 21* 19  CREATININE 0.79 0.83 0.86 0.72 0.78 0.73  CALCIUM 8.2* 8.3* 8.4* 8.3* 8.3* 8.2*  MG 2.4  --  1.9  --  1.6* 1.6*  PHOS  --   --   --   --  3.1 3.0   GFR: Estimated  Creatinine Clearance: 38.8 mL/min (by C-G formula based on SCr of 0.73 mg/dL). Liver Function Tests:  Recent Labs Lab 01/02/16 0521  AST 33  ALT 20  ALKPHOS 61  BILITOT 0.5  PROT 4.8*  ALBUMIN 2.2*   No results for input(s): LIPASE, AMYLASE in the last 168 hours. No results for input(s): AMMONIA in the last 168 hours. Coagulation Profile:  Recent Labs Lab 01/02/16 0938  INR 8.96*   Cardiac Enzymes: No results for input(s): CKTOTAL, CKMB, CKMBINDEX, TROPONINI in the last 168 hours. BNP (last 3 results) No results for input(s): PROBNP in the last 8760 hours. HbA1C: No results for input(s): HGBA1C in the last 72 hours. CBG:  Recent Labs Lab 12/28/15 [phone]  GLUCAP 78   Lipid Profile: No results for input(s): CHOL, HDL, LDLCALC, TRIG, CHOLHDL, LDLDIRECT in the last 72 hours. Thyroid Function Tests: No results for input(s): TSH, T4TOTAL, FREET4, T3FREE, THYROIDAB in the last 72 hours. Anemia Panel: No results for input(s): VITAMINB12, FOLATE, FERRITIN, TIBC, IRON, RETICCTPCT in the last 72 hours. Sepsis Labs: No results for input(s): PROCALCITON, LATICACIDVEN in the last 168 hours.  Recent Results (from the past 240 hour(s))  Culture, Urine     Status: Abnormal   Collection Time: 12/24/15  8:50 AM  Result Value Ref Range Status   Specimen Description URINE, RANDOM  Final   Special Requests NONE  Final   Culture MULTIPLE SPECIES PRESENT, SUGGEST RECOLLECTION (A)  Final   Report Status 12/26/2015 FINAL  Final  Blood culture (routine x 2)     Status: None   Collection Time: 12/24/15  9:56 AM  Result Value Ref Range Status   Specimen Description BLOOD LEFT HAND  Final   Special Requests IN PEDIATRIC BOTTLE  Final   Culture   Final    NO GROWTH 5 DAYS Performed at [facility]    Report Status 12/29/2015 FINAL  Final  Blood culture (routine x 2)     Status: None   Collection Time: 12/24/15 10:15 AM  Result Value Ref Range Status   Specimen Description  BLOOD LEFT ANTECUBITAL  Final   Special Requests BOTTLES DRAWN AEROBIC AND ANAEROBIC  Final   Culture   Final    NO GROWTH 5 DAYS Performed at [facility]    Report Status 12/29/2015 FINAL  Final  MRSA PCR Screening     Status: None   Collection Time: 12/25/15 10:30 PM  Result Value Ref Range Status   MRSA by PCR NEGATIVE NEGATIVE Final    Comment:        The GeneXpert MRSA Assay (FDA approved for NASAL specimens only), is one component of a comprehensive MRSA colonization surveillance program. It is not intended to diagnose MRSA infection nor to guide or monitor treatment for MRSA infections.     Radiology Studies: Ct Abdomen Pelvis Wo Contrast  Result Date: 12/31/2015 CLINICAL DATA:  82 year old female inpatient admitted 12/25/2015 with small bowel obstruction, found to have perforated sigmoid diverticulitis, now postoperative day 6 status post exploratory laparotomy with sigmoid colectomy and colostomy, with increasing leukocytosis. EXAM: CT ABDOMEN AND PELVIS WITHOUT CONTRAST TECHNIQUE: Multidetector CT imaging of the abdomen and pelvis was performed following the standard protocol without IV contrast. COMPARISON:  12/24/2015 CT abdomen/ pelvis. FINDINGS: Lower chest: Small bilateral pleural effusions with compressive dependent lower lobe atelectasis, right greater than left. Coronary atherosclerosis. Hepatobiliary: Normal liver size. Stable 1.6 x 1.1 cm subcapsular liver mass in the right posterior liver lobe, isodense to the liver parenchyma (series 3/ image 15). No additional liver mass. Mildly distended gallbladder is filled with layering sludge and/ or vicarious excretion of recently administered IV contrast. No appreciable gallbladder wall thickening. No biliary ductal dilatation. Common bile duct diameter 6 mm. No radiopaque choledocholithiasis. Pancreas: Normal, with no mass or duct dilation. Spleen: Normal size. No mass. Adrenals/Urinary Tract: Normal adrenals. No  hydronephrosis. No renal stones. No contour deforming renal mass. Poor visualization of the bladder due to streak artifact. Nondistended bladder. No appreciable bladder wall thickening. Focal gas in the anterior mid pelvis (series 3/image 70) may represent nondependent bladder lumen gas, although is poorly evaluated due to streak artifact. Stomach/Bowel: Stomach is mildly distended with oral contrast with  no gastric wall thickening. There are several mildly dilated mid small bowel loops in the central abdomen with air-fluid levels measuring up to the 3.8 cm diameter. Oral contrast progresses to the mid small bowel. No discrete small bowel caliber transition. No small bowel wall thickening or appreciable pneumatosis. Appendix is not discretely visualized. Interval subtotal sigmoid colectomy with end colostomy in the ventral left abdominal. Relatively collapsed large bowel. Blind-ending Hartmann's pouch in the deep pelvis at the level of the distal sigmoid colon with residual diverticulosis in the remnant distal sigmoid colon. No rectal wall thickening. No wall thickening in the remnant large bowel. Vascular/Lymphatic: Atherosclerotic nonaneurysmal abdominal aorta. No pathologically enlarged lymph nodes in the abdomen or pelvis. Reproductive: Status post hysterectomy, with poor visualization of the vaginal cuff due to streak artifact. No adnexal mass. Other: Right lower quadrant percutaneous surgical drain courses in the deep pelvis in terminates in the right lower quadrant. No evidence of a focal drainable fluid collection noting limited visualization of the deep pelvis due to streak artifact. Trace perihepatic ascites. Generalized mesenteric edema. Tiny focus of free air in the midline ventral peritoneal cavity near the umbilicus. Midline vertical laparotomy wound. Mild anasarca. Musculoskeletal: No aggressive appearing focal osseous lesions. Partially visualized right total hip arthroplasty. Moderate thoracolumbar  spondylosis. IMPRESSION: 1. No focal drainable fluid collection in the abdomen or pelvis, noting limited visualization of the deep pelvis due to streak artifact from right hip hardware. 2. Mildly dilated mid small bowel loops with air-fluid levels without discrete focal small bowel caliber transition, favor a mild adynamic ileus in the postoperative state. 3. Tiny focus of free air deep to the umbilicus is within normal recent postoperative limits. 4. Small bilateral pleural effusions, right greater than left. Trace perihepatic ascites. Mild anasarca. 5. Mildly distended gallbladder filled with sludge and/or vicariously excreted IV contrast. No definite gallbladder wall thickening. 6. **An incidental finding of potential clinical significance has been found. Indeterminate 1.6 cm posterior right liver lobe mass, stable since 12/24/2015 CT. If clinically warranted, outpatient MRI of the abdomen without and with IV contrast is warranted for further characterization. This recommendation follows ACR consensus guidelines: Managing Incidental Findings on Abdominal CT: White Paper of the ACR Incidental Findings Committee. J Am Coll Radiol 2010;7:754-773.** 7. Aortic atherosclerosis.  Coronary atherosclerosis. Electronically Signed   By: Delbert Phenix M.D.   On: 12/31/2015 18:49   Dg Chest 2 View  Result Date: 01/02/2016 CLINICAL DATA:  Wheezing. EXAM: CHEST  2 VIEW COMPARISON:  12/31/2015 FINDINGS: Examination demonstrates adequate lung volumes with persistent hazy bibasilar opacification likely small bilateral pleural effusions without significant change. This likely associated bibasilar atelectasis. Cardiomediastinal silhouette is within normal. There is calcified plaque over the thoracic aorta. There are degenerative changes of the spine. IMPRESSION: Persistent bibasilar opacification compatible with small effusions and associated atelectasis. Aortic atherosclerosis. Electronically Signed   By: Elberta Fortis M.D.    On: 01/02/2016 13:43   Scheduled Meds: . feeding supplement (GLUCERNA SHAKE)  237 mL Oral TID BM  . levothyroxine  25 mcg Oral QAC breakfast  . metoprolol  2.5 mg Intravenous Q12H  . pantoprazole (PROTONIX) IV  40 mg Intravenous Q24H  . piperacillin-tazobactam (ZOSYN)  IV  3.375 g Intravenous Q8H  . senna  1 tablet Oral Daily  . silver nitrate applicators  10 Stick Topical Once  . Warfarin - Pharmacist Dosing Inpatient   Does not apply q1800   Continuous Infusions: . dextrose 5% lactated ringers with KCl 20 mEq/L 75 mL/hr at  01/02/16 0905  . heparin 900 Units/hr (01/01/16 1617)    LOS: 9 days   Merlene Laughter, DO [facility] Pager [phone]  If 7PM-7AM, please contact night-coverage [email or web address] Password [email or web address] 01/02/2016, 3:00 PM

## 2016-01-02 NOTE — Progress Notes (Signed)
8 Days Post-Op  Subjective: Complains of drainage from wound  Objective: Vital signs in last 24 hours: Temp:  [98 F (36.7 C)-98.5 F (36.9 C)] 98.4 F (36.9 C) (12/23 0600) Pulse Rate:  [64-72] 71 (12/23 0600) Resp:  [16] 16 (12/23 0600) BP: (138-169)/(70-87) 140/82 (12/23 0600) SpO2:  [96 %-97 %] 96 % (12/23 0600) Weight:  [66.6 kg (146 lb 13.2 oz)] 66.6 kg (146 lb 13.2 oz) (12/22 2258) Last BM Date: 12/30/15 (colostomy)  Intake/Output from previous day: 12/22 0701 - 12/23 0700 In: 2782.5 [P.O.:720; I.V.:1912.5; IV Piggyback:150] Out: 1280 [Urine:800; Drains:330; Stool:150] Intake/Output this shift: Total I/O In: -  Out: 200 [Urine:200]  Resp: wheezes bilaterally Cardio: regular rate and rhythm GI: soft, appropriately tender. open wound clean with small vessels oozing. controlled with silver nitrate  Lab Results:   Recent Labs  01/01/16 0456 01/02/16 0521  WBC 15.9* 17.0*  HGB 11.4* 10.1*  HCT 35.4* 33.1*  PLT 423* 433*   BMET  Recent Labs  01/01/16 0456 01/02/16 0521  NA 144 145  K 3.6 4.0  CL 111 112*  CO2 28 27  GLUCOSE 130* 121*  BUN 21* 19  CREATININE 0.78 0.73  CALCIUM 8.3* 8.2*   PT/INR No results for input(s): LABPROT, INR in the last 72 hours. ABG No results for input(s): PHART, HCO3 in the last 72 hours.  Invalid input(s): PCO2, PO2  Studies/Results: Ct Abdomen Pelvis Wo Contrast  Result Date: 12/31/2015 CLINICAL DATA:  82 year old female inpatient admitted 12/25/2015 with small bowel obstruction, found to have perforated sigmoid diverticulitis, now postoperative day 6 status post exploratory laparotomy with sigmoid colectomy and colostomy, with increasing leukocytosis. EXAM: CT ABDOMEN AND PELVIS WITHOUT CONTRAST TECHNIQUE: Multidetector CT imaging of the abdomen and pelvis was performed following the standard protocol without IV contrast. COMPARISON:  12/24/2015 CT abdomen/ pelvis. FINDINGS: Lower chest: Small bilateral pleural  effusions with compressive dependent lower lobe atelectasis, right greater than left. Coronary atherosclerosis. Hepatobiliary: Normal liver size. Stable 1.6 x 1.1 cm subcapsular liver mass in the right posterior liver lobe, isodense to the liver parenchyma (series 3/ image 15). No additional liver mass. Mildly distended gallbladder is filled with layering sludge and/ or vicarious excretion of recently administered IV contrast. No appreciable gallbladder wall thickening. No biliary ductal dilatation. Common bile duct diameter 6 mm. No radiopaque choledocholithiasis. Pancreas: Normal, with no mass or duct dilation. Spleen: Normal size. No mass. Adrenals/Urinary Tract: Normal adrenals. No hydronephrosis. No renal stones. No contour deforming renal mass. Poor visualization of the bladder due to streak artifact. Nondistended bladder. No appreciable bladder wall thickening. Focal gas in the anterior mid pelvis (series 3/image 70) may represent nondependent bladder lumen gas, although is poorly evaluated due to streak artifact. Stomach/Bowel: Stomach is mildly distended with oral contrast with no gastric wall thickening. There are several mildly dilated mid small bowel loops in the central abdomen with air-fluid levels measuring up to the 3.8 cm diameter. Oral contrast progresses to the mid small bowel. No discrete small bowel caliber transition. No small bowel wall thickening or appreciable pneumatosis. Appendix is not discretely visualized. Interval subtotal sigmoid colectomy with end colostomy in the ventral left abdominal. Relatively collapsed large bowel. Blind-ending Hartmann's pouch in the deep pelvis at the level of the distal sigmoid colon with residual diverticulosis in the remnant distal sigmoid colon. No rectal wall thickening. No wall thickening in the remnant large bowel. Vascular/Lymphatic: Atherosclerotic nonaneurysmal abdominal aorta. No pathologically enlarged lymph nodes in the abdomen or pelvis.  Reproductive:  Status post hysterectomy, with poor visualization of the vaginal cuff due to streak artifact. No adnexal mass. Other: Right lower quadrant percutaneous surgical drain courses in the deep pelvis in terminates in the right lower quadrant. No evidence of a focal drainable fluid collection noting limited visualization of the deep pelvis due to streak artifact. Trace perihepatic ascites. Generalized mesenteric edema. Tiny focus of free air in the midline ventral peritoneal cavity near the umbilicus. Midline vertical laparotomy wound. Mild anasarca. Musculoskeletal: No aggressive appearing focal osseous lesions. Partially visualized right total hip arthroplasty. Moderate thoracolumbar spondylosis. IMPRESSION: 1. No focal drainable fluid collection in the abdomen or pelvis, noting limited visualization of the deep pelvis due to streak artifact from right hip hardware. 2. Mildly dilated mid small bowel loops with air-fluid levels without discrete focal small bowel caliber transition, favor a mild adynamic ileus in the postoperative state. 3. Tiny focus of free air deep to the umbilicus is within normal recent postoperative limits. 4. Small bilateral pleural effusions, right greater than left. Trace perihepatic ascites. Mild anasarca. 5. Mildly distended gallbladder filled with sludge and/or vicariously excreted IV contrast. No definite gallbladder wall thickening. 6. **An incidental finding of potential clinical significance has been found. Indeterminate 1.6 cm posterior right liver lobe mass, stable since 12/24/2015 CT. If clinically warranted, outpatient MRI of the abdomen without and with IV contrast is warranted for further characterization. This recommendation follows ACR consensus guidelines: Managing Incidental Findings on Abdominal CT: White Paper of the ACR Incidental Findings Committee. J Am Coll Radiol 2010;7:754-773.** 7. Aortic atherosclerosis.  Coronary atherosclerosis. Electronically Signed    By: Delbert PhenixJason A Poff M.D.   On: 12/31/2015 18:49   Dg Chest 2 View  Result Date: 12/31/2015 CLINICAL DATA:  Shortness of breath, elevated white blood cell count. History of atrial fibrillation, former smoker. EXAM: CHEST  2 VIEW COMPARISON:  Portable chest x-ray of April 28, 2014 FINDINGS: The lungs are reasonably well inflated. There small bilateral pleural effusions. The cardiac silhouette is top-normal in size. The pulmonary vascularity is not engorged. There is calcification in the wall of the thoracic aorta. The mediastinum is normal in width. There is mild degenerative disc disease at multiple thoracic spinal levels. IMPRESSION: COPD. New small bilateral pleural effusions. No alveolar pneumonia nor pulmonary edema. Thoracic aortic atherosclerosis. Electronically Signed   By: David  Swazil[place] M.D.   On: 12/31/2015 12:07    Anti-infectives: Anti-infectives    Start     Dose/Rate Route Frequency Ordered Stop   12/24/15 1800  piperacillin-tazobactam (ZOSYN) IVPB 3.375 g     3.375 g 12.5 mL/hr over 240 Minutes Intravenous Every 8 hours 12/24/15 1016     12/24/15 1015  piperacillin-tazobactam (ZOSYN) IVPB 3.375 g     3.375 g 100 mL/hr over 30 Minutes Intravenous  Once 12/24/15 1013 12/24/15 1101      Assessment/Plan: s/p Procedure(s): EXPLORATORY LAPAROTOMY, SIGMOID COLECTOMY, COLOSTOMY (N/A) Advance diet  Check u/a and cxr for increasing wbc Continue dressing changes  LOS: 9 days    TOTH III,Shalese Strahan S 01/02/2016

## 2016-01-03 DIAGNOSIS — R278 Other lack of coordination: Secondary | ICD-10-CM | POA: Diagnosis not present

## 2016-01-03 DIAGNOSIS — I480 Paroxysmal atrial fibrillation: Secondary | ICD-10-CM | POA: Diagnosis not present

## 2016-01-03 DIAGNOSIS — Z48815 Encounter for surgical aftercare following surgery on the digestive system: Secondary | ICD-10-CM | POA: Diagnosis not present

## 2016-01-03 DIAGNOSIS — I10 Essential (primary) hypertension: Secondary | ICD-10-CM | POA: Diagnosis not present

## 2016-01-03 DIAGNOSIS — Z433 Encounter for attention to colostomy: Secondary | ICD-10-CM | POA: Diagnosis not present

## 2016-01-03 DIAGNOSIS — M6281 Muscle weakness (generalized): Secondary | ICD-10-CM | POA: Diagnosis not present

## 2016-01-03 DIAGNOSIS — I482 Chronic atrial fibrillation: Secondary | ICD-10-CM | POA: Diagnosis not present

## 2016-01-03 DIAGNOSIS — K5720 Diverticulitis of large intestine with perforation and abscess without bleeding: Secondary | ICD-10-CM | POA: Diagnosis not present

## 2016-01-03 DIAGNOSIS — E038 Other specified hypothyroidism: Secondary | ICD-10-CM | POA: Diagnosis not present

## 2016-01-03 DIAGNOSIS — G4700 Insomnia, unspecified: Secondary | ICD-10-CM | POA: Diagnosis not present

## 2016-01-03 DIAGNOSIS — K5733 Diverticulitis of large intestine without perforation or abscess with bleeding: Secondary | ICD-10-CM | POA: Diagnosis not present

## 2016-01-03 DIAGNOSIS — R262 Difficulty in walking, not elsewhere classified: Secondary | ICD-10-CM | POA: Diagnosis not present

## 2016-01-03 DIAGNOSIS — E569 Vitamin deficiency, unspecified: Secondary | ICD-10-CM | POA: Diagnosis not present

## 2016-01-03 LAB — MAGNESIUM: Magnesium: 1.9 mg/dL (ref 1.7–2.4)

## 2016-01-03 LAB — CBC
HCT: 30.6 % — ABNORMAL LOW (ref 36.0–46.0)
Hemoglobin: 9.8 g/dL — ABNORMAL LOW (ref 12.0–15.0)
MCH: 29.3 pg (ref 26.0–34.0)
MCHC: 32 g/dL (ref 30.0–36.0)
MCV: 91.3 fL (ref 78.0–100.0)
PLATELETS: 429 10*3/uL — AB (ref 150–400)
RBC: 3.35 MIL/uL — AB (ref 3.87–5.11)
RDW: 15 % (ref 11.5–15.5)
WBC: 14 10*3/uL — ABNORMAL HIGH (ref 4.0–10.5)

## 2016-01-03 LAB — COMPREHENSIVE METABOLIC PANEL
ALBUMIN: 2 g/dL — AB (ref 3.5–5.0)
ALK PHOS: 51 U/L (ref 38–126)
ALT: 19 U/L (ref 14–54)
AST: 30 U/L (ref 15–41)
Anion gap: 6 (ref 5–15)
BILIRUBIN TOTAL: 0.6 mg/dL (ref 0.3–1.2)
BUN: 14 mg/dL (ref 6–20)
CO2: 26 mmol/L (ref 22–32)
CREATININE: 0.75 mg/dL (ref 0.44–1.00)
Calcium: 8 mg/dL — ABNORMAL LOW (ref 8.9–10.3)
Chloride: 110 mmol/L (ref 101–111)
GFR calc Af Amer: >60 mL/min (ref >60)
GLUCOSE: 105 mg/dL — AB (ref 65–99)
Potassium: 4 mmol/L (ref 3.5–5.1)
Sodium: 142 mmol/L (ref 135–145)
TOTAL PROTEIN: 4.7 g/dL — AB (ref 6.5–8.1)

## 2016-01-03 LAB — HEPARIN LEVEL (UNFRACTIONATED): HEPARIN UNFRACTIONATED: 0.63 [IU]/mL (ref 0.30–0.70)

## 2016-01-03 LAB — PROTIME-INR
INR: 2.86
Prothrombin Time: 30.6 seconds — ABNORMAL HIGH (ref 11.4–15.2)

## 2016-01-03 LAB — PHOSPHORUS: Phosphorus: 2.8 mg/dL (ref 2.5–4.6)

## 2016-01-03 MED ORDER — SENNA 8.6 MG PO TABS: 1.0000 | ORAL_TABLET | Freq: Every day | ORAL | 0 refills | Status: DC

## 2016-01-03 MED ORDER — GLUCERNA SHAKE PO LIQD: 237.0000 mL | Freq: Three times a day (TID) | ORAL | 0 refills | Status: DC

## 2016-01-03 MED ORDER — AMLODIPINE BESYLATE 5 MG PO TABS
5.0000 mg | ORAL_TABLET | Freq: Every day | ORAL | Status: DC
  Administered 2016-01-03: 5 mg via ORAL
  Filled 2016-01-03: qty 1

## 2016-01-03 MED ORDER — ACETAMINOPHEN 325 MG PO TABS: 650.0000 mg | ORAL_TABLET | Freq: Four times a day (QID) | ORAL | 0 refills | Status: DC | PRN

## 2016-01-03 MED ORDER — AMOXICILLIN-POT CLAVULANATE 875-125 MG PO TABS: 1.0000 | ORAL_TABLET | Freq: Two times a day (BID) | ORAL | 0 refills | Status: DC

## 2016-01-03 MED ORDER — SIMETHICONE 80 MG PO CHEW: 80.0000 mg | CHEWABLE_TABLET | ORAL | 0 refills | Status: DC | PRN

## 2016-01-03 MED ORDER — OXYCODONE HCL 5 MG PO TABS: 5.0000 mg | ORAL_TABLET | ORAL | 0 refills | Status: DC | PRN

## 2016-01-03 NOTE — Progress Notes (Signed)
ANTICOAGULATION CONSULT NOTE -   Pharmacy Consult for heparin/warfarin Indication: atrial fibrillation  No Known Allergies  Patient Measurements: Height: 5' 1.5" (156.2 cm) Weight: 148 lb 5.9 oz (67.3 kg) IBW/kg (Calculated) : 48.95 Heparin Dosing Weight: 57.6kg  Vital Signs: Temp: 98.3 F (36.8 C) (12/24 0430) Temp Source: Oral (12/24 0430) BP: 150/93 (12/24 0430) Pulse Rate: 83 (12/24 0430)  Labs:  Recent Labs  01/01/16 0456 01/01/16 1515 01/02/16 0521 01/02/16 0938 01/03/16 0527  HGB 11.4*  --  10.1*  --  9.8*  HCT 35.4*  --  33.1*  --  30.6*  PLT 423*  --  433*  --  429*  LABPROT  --   --   --  76.3* 30.6*  INR  --   --   --  8.96* 2.86  HEPARINUNFRC 0.10* 0.25* 0.51  --  0.63  CREATININE 0.78  --  0.73  --  0.75    Estimated Creatinine Clearance: 39 mL/min (by C-G formula based on SCr of 0.75 mg/dL).   Medical History: Past Medical History:  Diagnosis Date  . Alopecia   . Atrial fibrillation (HCC)   . Bradycardia   . Unspecified essential hypertension      Assessment: 82 yo female admitted with Diverticulitis of colon with perforation s/p colectomy/colostomy 12/25/2015.  PTA warfarin for paroxysmal A Fib, INR supratherapeutic on admission, IV vitamin K and PRBC's given on 12/15. Pharmacy consulted to dose heparin drip and warfarin for AFib.  Home warfarin regiment 2.5mg  po daily 12/16 INR= 1.26  01/03/2016  HL therapeutic   INR 2.86, therapeutic after 2.5mg  Vit k yesterday  Surgery noted small vessels oozing on wound and controlled with silver nitrate  H/H decreased, Plts elevated  No bleeding or infusion-related issues reported per RN.  No drug interactions ntoed  Pt 's appetite improving and eating ~ 50% of meals    Goal of Therapy:  Heparin level 0.3-0.7 units/ml  INR 2-3 Monitor platelets by anticoagulation protocol  Plan:  -Suggest stopping heparin as INR is therapeutic -No warfarin today -Check INR tomorrow,will need to be  VERY conservative with dosing    Arley PhenixEllen Shayley Medlin RPh 01/03/2016, 11:16 AM Pager [phone]

## 2016-01-03 NOTE — Discharge Summary (Signed)
Physician Discharge Summary  Candace Cain ZOX:[id number] DOB: [birth year] DOA: [birth year]  PCP: Duane Lope, MD  Admit date: 12/24/2015 Discharge date: 01/03/2016  Admitted From: Home Disposition:  SNF  Recommendations for Outpatient Follow-up:  1. Follow up with PCP in 1-2 weeks 2. Follow up with General Surgery Dr. Luisa Hart in 1-2 weeks 3. Repeat INR at Facility tomorrow 4. Follow up about Indterminate 1.6 cm poster Right Liver Lobe Mass with an Outpatient MRI with and without IV Contrast 5. Please obtain BMP/CBC in one week  Home Health: No Equipment/Devices: None  Discharge Condition: Stable CODE STATUS: DNR Diet recommendation: Heart Healthy Soft Diet  Brief/Interim Summary: Candace Wilmothis a 82 y.o.femalewith medical history significant of atrial fibrillation, hypertension, hypothyroidism. She states that about 1 week ago she started having some loose and watery stools. She took some Imodium which helped with her loose stools because her to be constipated. At that time, she started having abdominal pain. She started taking a stool softener over-the-counter to help with her constipation which helped. Her abdominal pain continued to worsen over the next few days and she started developing nausea. Her pain was so severe last night that she fell she needs to be evaluated in the Emergency department. Her last bowel movement was 4 days ago and her last time passing gas was 3 days ago. Found to have a perforated sigmoid diverticulitis with peritonitis and SBO s/p Exploratory laparotomy with Colostomy and drain placement 12/25/2015. She had an elevation in her WBC that was investigated with no source of focal infection. Patient was restarted on Coumadin and INR was supratherapeutic so she was given 1 dose of Vitamin K. She steadily improved INR was Therapeutic today and after discussion with General Surgery she was medically clear to be D/C'd.   Discharge Diagnoses:  Principal Problem:    Diverticulitis of colon with perforation s/p colectomy/colostomy 12/25/2015 Active Problems:   Essential hypertension   Atrial fibrillation (HCC)   Hypothyroidism   Small bowel obstruction   Protein-calorie malnutrition, severe   Perforated bowel (HCC)   Diverticulitis of large intestine with abscess without bleeding   Hypokalemia  Perforated sigmoid diverticulitis with peritonitis and small bowel obstruction s/p Exploratory Laparotomy with sigmoid colectomy and colostomy creation POD80 -s/p Exploratory laparotomy with sigmoid colectomy and creation of colostomy on 12/15. -Pain Control Norco -C/w with Ambulation; Ondansetron for Nausea -D/C'd IV Zosyn and changed to po Augmentin for 3 more days -WBC decreased to 14.0  -CXR ordered by General Surgery and showed The lungs are reasonably well inflated. There small bilateral pleural effusions. The cardiac silhouette is top-normal in size. The pulmonary vascularity is not engorged. There is calcification in the wall of the thoracic aorta. The mediastinum is normal in width. There is mild degenerative disc disease at multiple thoracic spinal levels. -Urinalysis showed Cloudy Urine with Moderate Leukocytes, Negative Nitrites, and TNCT WBC's but patient is not complaining of Dysuria or burning and is Asymptomatic. -Repeat CT Scan Done showed  No focal drainable fluid collection in the abdomen or pelvis, noting limited visualization of the deep pelvis due to streak artifact from right hip hardware.  Mildly dilated mid small bowel loops with air-fluid levels without discrete focal small bowel caliber transition, favor a mild adynamic ileus in the postoperative state. Tiny focus of free air deep to the umbilicus is within normal recent postoperative limits. Small bilateral pleural effusions, right greater than left. Trace perihepatic ascites. Mild anasarca. Mildly distended gallbladder filled with sludge and/or vicariously excreted IV contrast. No  definite gallbladder wall thickening. -General Surgery Again repeated CXR  -CXR 01/02/16 showed Persistent bibasilar opacification compatible with small effusions and associated atelectasis. Aortic atherosclerosis. -Discussed with Dr. Luciana Axe in Infectious Disease and after reviewing the case states to continue to Monitor for S/Sx of Infection and is not concerned with WBC increasing after reviewing CT Scan of Abdomen. Recommended Discontinuing IV Abx in next day or 2. -Repeat CBC at facility -Wound Care Consult for Patient Education per General Surgery; Had Drain Removed -C/w Soft Diet -Surgery added Simethicone 80 mg Chewable Tablet q4hprn for Flatulence -Appreciated General Surgery Evaluation and Further Recc's  Paroxysmal Atrial fibrillation with h/o Bradycardia -She is not on any rate or rhythm control agent at home. Patient on coumadin at home. INR of 2.96 on admission.  -Had Prior Reversal with 2 mg of Vitamin K and and ffp prior to surgery on 12/15, -Heparin drip started on 12/18,hgb slightly decreased and patient was bleeding from incision site which was cauterized by Dr. Carolynne Edouard this Am.  -Hb/Hct Dropped from 11.4/35.4 -> 10.1/33.1 -> 9.8/30.6 -Post op day one on 12/16, patient heart rate increased with intermittent afib, started on low dose iv lopressor, she is monitor in stepdown that day, later transferred to med tele.  -D/C IV lopressor at D/C -Repeat PT-INR in AM as well as CBC at Facility -Patient's INR this AM was 2.86; Hold Tonights Coumadin dose and Repeat INR in AM  Hypokalemia:  -Improved. -Patient's Potassium was 4.0 -Replete as necessary -Repeat BMP in AM  Mild Hypernatremia, improved -Patient's Na+ was 142 this AM -Continue to Monitor; C/w Soft Diet -Repeat BMP in AM  Hypothyroidism -C/w Levothyroxine 25 mcg po Daily   Hypertension -Resume Home Meds  Severe malnutrition in context of chronic illness -C/w Soft Diet -Nutrition Consultation.    Indeterminate 1.6 cm posterior Right Liver Lobe Mass -Outpatient MRI of the Abdomen with and without IV contrast warranted for Further Evaluation.   Discharge Instructions  Discharge Instructions    Call MD for:  difficulty breathing, headache or visual disturbances    Complete by:  As directed    Call MD for:  persistant dizziness or light-headedness    Complete by:  As directed    Call MD for:  persistant nausea and vomiting    Complete by:  As directed    Call MD for:  redness, tenderness, or signs of infection (pain, swelling, redness, odor or green/yellow discharge around incision site)    Complete by:  As directed    Call MD for:  severe uncontrolled pain    Complete by:  As directed    Call MD for:  temperature >100.4    Complete by:  As directed    Diet - low sodium heart healthy    Complete by:  As directed    Discharge instructions    Complete by:  As directed    Follow Up Wound Care for Surgery per Protocol. Follow up with Dr. Luisa Hart as an outpatient in 2 weeks in General Surgery. Repeat INR in AM.   Increase activity slowly    Complete by:  As directed      Allergies as of 01/03/2016   No Known Allergies     Medication List    TAKE these medications   acetaminophen 325 MG tablet Commonly known as:  TYLENOL Take 2 tablets (650 mg total) by mouth every 6 (six) hours as needed for mild pain or moderate pain.   amLODipine 5 MG tablet Commonly known  as:  NORVASC Take 5 mg by mouth daily.   amoxicillin-clavulanate 875-125 MG tablet Commonly known as:  AUGMENTIN Take 1 tablet by mouth 2 (two) times daily.   b complex vitamins capsule Take 1 capsule by mouth daily.   feeding supplement (GLUCERNA SHAKE) Liqd Take 237 mLs by mouth 3 (three) times daily between meals.   levothyroxine 25 MCG tablet Commonly known as:  SYNTHROID, LEVOTHROID Take 25 mcg by mouth daily before breakfast.   losartan 100 MG tablet Commonly known as:  COZAAR Take 100 mg by  mouth daily.   oxyCODONE 5 MG immediate release tablet Commonly known as:  Oxy IR/ROXICODONE Take 1-2 tablets (5-10 mg total) by mouth every 4 (four) hours as needed for moderate pain or severe pain.   senna 8.6 MG Tabs tablet Commonly known as:  SENOKOT Take 1 tablet (8.6 mg total) by mouth daily. Start taking on:  01/04/2016   simethicone 80 MG chewable tablet Commonly known as:  MYLICON Chew 1 tablet (80 mg total) by mouth every 4 (four) hours as needed for flatulence.   TH CALCIUM CARBONATE-VITAMIN D 600-400 MG-UNIT tablet Generic drug:  Calcium Carbonate-Vitamin D Take 2 tablets by mouth daily.   vitamin B-12 1000 MCG tablet Commonly known as:  CYANOCOBALAMIN Take 1,000 mcg by mouth daily.   warfarin 2.5 MG tablet Commonly known as:  COUMADIN Take 2.5 mg by mouth every evening. What changed:  Another medication with the same name was removed. Continue taking this medication, and follow the directions you see here.      Contact information for after-discharge care    Destination    [facility] .   Specialty:  Skilled Nursing Facility Contact information: [address] [place] [id number] [phone]             No Known Allergies  Consultations:  General Surgery  PCCM  Procedures/Studies: Ct Abdomen Pelvis Wo Contrast  Result Date: 12/31/2015 CLINICAL DATA:  82 year old female inpatient admitted 12/25/2015 with small bowel obstruction, found to have perforated sigmoid diverticulitis, now postoperative day 6 status post exploratory laparotomy with sigmoid colectomy and colostomy, with increasing leukocytosis. EXAM: CT ABDOMEN AND PELVIS WITHOUT CONTRAST TECHNIQUE: Multidetector CT imaging of the abdomen and pelvis was performed following the standard protocol without IV contrast. COMPARISON:  12/24/2015 CT abdomen/ pelvis. FINDINGS: Lower chest: Small bilateral pleural effusions with compressive dependent lower lobe atelectasis,  right greater than left. Coronary atherosclerosis. Hepatobiliary: Normal liver size. Stable 1.6 x 1.1 cm subcapsular liver mass in the right posterior liver lobe, isodense to the liver parenchyma (series 3/ image 15). No additional liver mass. Mildly distended gallbladder is filled with layering sludge and/ or vicarious excretion of recently administered IV contrast. No appreciable gallbladder wall thickening. No biliary ductal dilatation. Common bile duct diameter 6 mm. No radiopaque choledocholithiasis. Pancreas: Normal, with no mass or duct dilation. Spleen: Normal size. No mass. Adrenals/Urinary Tract: Normal adrenals. No hydronephrosis. No renal stones. No contour deforming renal mass. Poor visualization of the bladder due to streak artifact. Nondistended bladder. No appreciable bladder wall thickening. Focal gas in the anterior mid pelvis (series 3/image 70) may represent nondependent bladder lumen gas, although is poorly evaluated due to streak artifact. Stomach/Bowel: Stomach is mildly distended with oral contrast with no gastric wall thickening. There are several mildly dilated mid small bowel loops in the central abdomen with air-fluid levels measuring up to the 3.8 cm diameter. Oral contrast progresses to the mid small bowel. No discrete small  bowel caliber transition. No small bowel wall thickening or appreciable pneumatosis. Appendix is not discretely visualized. Interval subtotal sigmoid colectomy with end colostomy in the ventral left abdominal. Relatively collapsed large bowel. Blind-ending Hartmann's pouch in the deep pelvis at the level of the distal sigmoid colon with residual diverticulosis in the remnant distal sigmoid colon. No rectal wall thickening. No wall thickening in the remnant large bowel. Vascular/Lymphatic: Atherosclerotic nonaneurysmal abdominal aorta. No pathologically enlarged lymph nodes in the abdomen or pelvis. Reproductive: Status post hysterectomy, with poor visualization of  the vaginal cuff due to streak artifact. No adnexal mass. Other: Right lower quadrant percutaneous surgical drain courses in the deep pelvis in terminates in the right lower quadrant. No evidence of a focal drainable fluid collection noting limited visualization of the deep pelvis due to streak artifact. Trace perihepatic ascites. Generalized mesenteric edema. Tiny focus of free air in the midline ventral peritoneal cavity near the umbilicus. Midline vertical laparotomy wound. Mild anasarca. Musculoskeletal: No aggressive appearing focal osseous lesions. Partially visualized right total hip arthroplasty. Moderate thoracolumbar spondylosis. IMPRESSION: 1. No focal drainable fluid collection in the abdomen or pelvis, noting limited visualization of the deep pelvis due to streak artifact from right hip hardware. 2. Mildly dilated mid small bowel loops with air-fluid levels without discrete focal small bowel caliber transition, favor a mild adynamic ileus in the postoperative state. 3. Tiny focus of free air deep to the umbilicus is within normal recent postoperative limits. 4. Small bilateral pleural effusions, right greater than left. Trace perihepatic ascites. Mild anasarca. 5. Mildly distended gallbladder filled with sludge and/or vicariously excreted IV contrast. No definite gallbladder wall thickening. 6. **An incidental finding of potential clinical significance has been found. Indeterminate 1.6 cm posterior right liver lobe mass, stable since 12/24/2015 CT. If clinically warranted, outpatient MRI of the abdomen without and with IV contrast is warranted for further characterization. This recommendation follows ACR consensus guidelines: Managing Incidental Findings on Abdominal CT: White Paper of the ACR Incidental Findings Committee. J Am Coll Radiol 2010;7:754-773.** 7. Aortic atherosclerosis.  Coronary atherosclerosis. Electronically Signed   By: Delbert Phenix M.D.   On: 12/31/2015 18:49   Dg Chest 2  View  Result Date: 01/02/2016 CLINICAL DATA:  Wheezing. EXAM: CHEST  2 VIEW COMPARISON:  12/31/2015 FINDINGS: Examination demonstrates adequate lung volumes with persistent hazy bibasilar opacification likely small bilateral pleural effusions without significant change. This likely associated bibasilar atelectasis. Cardiomediastinal silhouette is within normal. There is calcified plaque over the thoracic aorta. There are degenerative changes of the spine. IMPRESSION: Persistent bibasilar opacification compatible with small effusions and associated atelectasis. Aortic atherosclerosis. Electronically Signed   By: Elberta Fortis M.D.   On: 01/02/2016 13:43   Dg Chest 2 View  Result Date: 12/31/2015 CLINICAL DATA:  Shortness of breath, elevated white blood cell count. History of atrial fibrillation, former smoker. EXAM: CHEST  2 VIEW COMPARISON:  Portable chest x-ray of April 28, 2014 FINDINGS: The lungs are reasonably well inflated. There small bilateral pleural effusions. The cardiac silhouette is top-normal in size. The pulmonary vascularity is not engorged. There is calcification in the wall of the thoracic aorta. The mediastinum is normal in width. There is mild degenerative disc disease at multiple thoracic spinal levels. IMPRESSION: COPD. New small bilateral pleural effusions. No alveolar pneumonia nor pulmonary edema. Thoracic aortic atherosclerosis. Electronically Signed   By: David  [place] M.D.   On: 12/31/2015 12:07   Ct Abdomen Pelvis W Contrast  Result Date: 12/24/2015 CLINICAL DATA:  Abdominal  pain, distension, and nausea. EXAM: CT ABDOMEN AND PELVIS WITH CONTRAST TECHNIQUE: Multidetector CT imaging of the abdomen and pelvis was performed using the standard protocol following bolus administration of intravenous contrast. CONTRAST:  60mL ISOVUE-300 IOPAMIDOL (ISOVUE-300) INJECTION 61% COMPARISON:  Abdominal ultrasound from 07/27/2005 and 11/27/2006. Abdominal radiograph 03/29/2014 FINDINGS:  Lower chest: Cylindrical bronchiectasis in both lower lobes with associated airway thickening. Coronary atherosclerotic calcification. Hepatobiliary: 1.6 by 1.0 by 2.3 cm low-density lesion along the posterior capsular margin of the right hepatic lobe, image 15/2. This is slightly higher in density than the perihepatic ascites. Dependent higher density in the gallbladder, likely from sludge. Pancreas: Unremarkable Spleen: Unremarkable Adrenals/Urinary Tract: Adrenal glands normal. Distal ureters obscured by streak artifact from the right hip implant. No hydronephrosis. There is a calcification in the vicinity of the right proximal ureter but thought to likely be outside of the ureter on image 71/5. This is probably a vascular calcification. There is some equivocal accentuated wall thickening/ wall enhancement of both renal collecting systems. Stomach/Bowel: There is swirling of the mesentery associated with small bowel obstruction, dilated bowel loops up to 4.7 cm, lead point for the small bowel obstruction in the region of swirling mesentery shown on images 44 through 59 of series 2 in the central abdomen at about the level of the iliac crests. The small bowel in the pelvis is indistinct due to a variety of factors. I am suspicious that there is acute sigmoid diverticulitis superimposed on the acute fell and fractured, and extra luminal fluid/ abscess along the sigmoid colon is not excluded. Some of this appearance may actually be due to edematous and irregular small bowel loops in this vicinity the but the sigmoid colon appears inflamed and with abnormal fluid collection along its margins suspicious for abscess. The distal most loops of small bowel are not distended. Vascular/Lymphatic: Aortoiliac atherosclerotic vascular disease. No adenopathy identified. Reproductive: Obscured by streak artifact from the right hip hemiarthroplasty. Other: Abnormal diffuse mesenteric stranding.  Perihepatic ascites.  Musculoskeletal: Age-appropriate lumbar spondylosis and degenerative disc disease. IMPRESSION: 1. Acute small bowel obstruction with mesenteric edema and mesenteric vessels swirling around a lead point at transition of dilated to nondilated small bowel, suspicious for volvulus. This is shown about at the level of the iliac crests in the central abdomen, for example images 44 through 59 series 2. Closed loop ischemia of the involved loop of pelvic small bowel extending down into the pelvis is not entirely excluded. Surgical consultation recommended. 2. There also appears to be acute sigmoid colon diverticulitis with possible abscesses along the margin of the sigmoid colon. This is immediately adjacent to the involved loop of small bowel which extends down into the pelvis which complicates the assessment of both processes. Moreover, there is streak artifact from a right hip hemiarthroplasty which obscures findings as well. 3.  Aortoiliac atherosclerotic vascular disease. 4. Perihepatic ascites and diffuse mesenteric stranding in the abdomen. 5. There is a low-density collection or lesion along the posterior margin of the liver, this may simply be a localized part of the ascites, and is less likely to be some sort of nodular peritoneal implant. These results will be called to the ordering clinician or representative by the Radiologist Assistant, and communication documented in the PACS or zVision Dashboard. Electronically Signed   By: Gaylyn Rong M.D.   On: 12/24/2015 11:54   Dg Abd 2 Views  Result Date: 12/25/2015 CLINICAL DATA:  Loose and watery stools.  Abdominal pain. EXAM: ABDOMEN - 2 VIEW COMPARISON:  12/24/2015; CT abdomen pelvis - 12/24/2015 FINDINGS: Enteric tube tip and side port projected the expected location of the gastric fundus. Re- demonstrated moderate to marked gas distention of several centralized loops of small bowel with index loop measuring approximately 4.8 cm in diameter, unchanged  to slightly progressed since the 12/2012 examination. These findings are again associated with a conspicuous paucity of distal colonic gas. Nondiagnostic evaluation for pneumoperitoneum secondary supine positioning exclusion a lower thorax. No definite pneumatosis or portal venous gas. No radiopaque pill fragment overlies the left mid hemiabdomen. Punctate phleboliths overlies the left hemipelvis. Faster calcifications overlie the pelvis bilaterally Post right bipolar hip replacement, incompletely evaluated. IMPRESSION: 1. Similar findings compatible with high-grade small bowel obstruction, unchanged to minimally worsened in the interval. 2. Enteric tube tip and side port project over the gastric fundus. Electronically Signed   By: Simonne Come M.D.   On: 12/25/2015 09:07   Dg Abd Portable 1v-small Bowel Protocol-position Verification  Result Date: 12/24/2015 CLINICAL DATA:  NG tube placement EXAM: PORTABLE ABDOMEN - 1 VIEW COMPARISON:  CT scan same day FINDINGS: Gaseous distended small bowel loops in the abdomen suspicious for bowel obstruction. NG tube with tip in mid stomach. IMPRESSION: NG tube with tip in mid stomach. Electronically Signed   By: Natasha Mead M.D.   On: 12/24/2015 15:19     Subjective:  Seen and examined at bedside and was doing well. No N/V today. Had some gas pains but relieved with Simethicone. Ambulating well. Had some bleeding from wound that was evaluated by General Surgery. No other concerns or complaints and ready to go to SNF.   Discharge Exam: Vitals:   01/02/16 2146 01/03/16 0430  BP: (!) 157/72 (!) 150/93  Pulse: 67 83  Resp: 14 13  Temp: 98.3 F (36.8 C) 98.3 F (36.8 C)   Vitals:   01/02/16 0600 01/02/16 1500 01/02/16 2146 01/03/16 0430  BP: 140/82 (!) 146/85 (!) 157/72 (!) 150/93  Pulse: 71 73 67 83  Resp: 16 16 14 13   Temp: 98.4 F (36.9 C) 98.2 F (36.8 C) 98.3 F (36.8 C) 98.3 F (36.8 C)  TempSrc: Oral Oral Oral Oral  SpO2: 96% 99% 98% 98%   Weight:    67.3 kg (148 lb 5.9 oz)  Height:       General: Pt is alert, awake, not in acute distress Cardiovascular: RRR, S1/S2 +, no rubs, no gallops Respiratory: Diminished bilaterally, no wheezing, no rhonchi Abdominal: Soft, NT, ND, bowel sounds +; Colostomy in Place with good gas and output. JP Drain in place and will be removed prior to D/C.  Extremities: Mild pedal edema, no cyanosis  The results of significant diagnostics from this hospitalization (including imaging, microbiology, ancillary and laboratory) are listed below for reference.    Microbiology: Recent Results (from the past 240 hour(s))  MRSA PCR Screening     Status: None   Collection Time: 12/25/15 10:30 PM  Result Value Ref Range Status   MRSA by PCR NEGATIVE NEGATIVE Final    Comment:        The GeneXpert MRSA Assay (FDA approved for NASAL specimens only), is one component of a comprehensive MRSA colonization surveillance program. It is not intended to diagnose MRSA infection nor to guide or monitor treatment for MRSA infections.     Labs: BNP (last 3 results) No results for input(s): BNP in the last 8760 hours. Basic Metabolic Panel:  Recent Labs Lab 12/28/15 0345  12/30/15 0516 12/31/15 0620 01/01/16 0456 01/02/16  [id number] 01/03/16 0527  NA 148*  < > 149* 148* 144 145 142  K 3.8  < > 3.8 3.5 3.6 4.0 4.0  CL 115*  < > 116* 113* 111 112* 110  CO2 22  < > 25 27 28 27 26   GLUCOSE 90  < > 111* 122* 130* 121* 105*  BUN 35*  < > 24* 22* 21* 19 14  CREATININE 0.79  < > 0.86 0.72 0.78 0.73 0.75  CALCIUM 8.2*  < > 8.4* 8.3* 8.3* 8.2* 8.0*  MG 2.4  --  1.9  --  1.6* 1.6* 1.9  PHOS  --   --   --   --  3.1 3.0 2.8  < > = values in this interval not displayed. Liver Function Tests:  Recent Labs Lab 01/02/16 0521 01/03/16 0527  AST 33 30  ALT 20 19  ALKPHOS 61 51  BILITOT 0.5 0.6  PROT 4.8* 4.7*  ALBUMIN 2.2* 2.0*   No results for input(s): LIPASE, AMYLASE in the last 168 hours. No results for  input(s): AMMONIA in the last 168 hours. CBC:  Recent Labs Lab 12/28/15 0345 12/29/15 0449  12/31/15 0620 12/31/15 1230 01/01/16 0456 01/02/16 0521 01/03/16 0527  WBC 27.2* 16.3*  < > 17.7* 17.5* 15.9* 17.0* 14.0*  NEUTROABS 25.0* 14.2*  --   --  15.5*  --   --   --   HGB 11.0* 11.6*  < > 12.2 12.0 11.4* 10.1* 9.8*  HCT 34.1* 35.9*  < > 37.7 38.5 35.4* 33.1* 30.6*  MCV 90.5 90.4  < > 88.9 93.4 89.2 89.2 91.3  PLT 415* 381  < > 422* 446* 423* 433* 429*  < > = values in this interval not displayed. Cardiac Enzymes: No results for input(s): CKTOTAL, CKMB, CKMBINDEX, TROPONINI in the last 168 hours. BNP: Invalid input(s): POCBNP CBG:  Recent Labs Lab 12/28/15 0746  GLUCAP 78   D-Dimer No results for input(s): DDIMER in the last 72 hours. Hgb A1c No results for input(s): HGBA1C in the last 72 hours. Lipid Profile No results for input(s): CHOL, HDL, LDLCALC, TRIG, CHOLHDL, LDLDIRECT in the last 72 hours. Thyroid function studies No results for input(s): TSH, T4TOTAL, T3FREE, THYROIDAB in the last 72 hours.  Invalid input(s): FREET3 Anemia work up No results for input(s): VITAMINB12, FOLATE, FERRITIN, TIBC, IRON, RETICCTPCT in the last 72 hours. Urinalysis    Component Value Date/Time   COLORURINE YELLOW 12/31/2015 1540   APPEARANCEUR HAZY (A) 12/31/2015 1540   LABSPEC 1.028 12/31/2015 1540   PHURINE 5.0 12/31/2015 1540   GLUCOSEU NEGATIVE 12/31/2015 1540   HGBUR SMALL (A) 12/31/2015 1540   BILIRUBINUR NEGATIVE 12/31/2015 1540   KETONESUR NEGATIVE 12/31/2015 1540   PROTEINUR 30 (A) 12/31/2015 1540   UROBILINOGEN 0.2 04/25/2014 2228   NITRITE NEGATIVE 12/31/2015 1540   LEUKOCYTESUR TRACE (A) 12/31/2015 1540   Sepsis Labs Invalid input(s): PROCALCITONIN,  WBC,  LACTICIDVEN Microbiology Recent Results (from the past 240 hour(s))  MRSA PCR Screening     Status: None   Collection Time: 12/25/15 10:30 PM  Result Value Ref Range Status   MRSA by PCR NEGATIVE  NEGATIVE Final    Comment:        The GeneXpert MRSA Assay (FDA approved for NASAL specimens only), is one component of a comprehensive MRSA colonization surveillance program. It is not intended to diagnose MRSA infection nor to guide or monitor treatment for MRSA infections.    Time coordinating discharge: Over 30 minutes  SIGNED:  Merlene Laughter, DO [facility] 01/03/2016, 11:18 AM Pager [phone]  If 7PM-7AM, please contact night-coverage [email or web address] Password [email or web address]

## 2016-01-03 NOTE — Progress Notes (Signed)
9 Days Post-Op  Subjective: No complaints. Noted some bleeding from wound  Objective: Vital signs in last 24 hours: Temp:  [98.2 F (36.8 C)-98.3 F (36.8 C)] 98.3 F (36.8 C) (12/24 0430) Pulse Rate:  [67-83] 83 (12/24 0430) Resp:  [13-16] 13 (12/24 0430) BP: (146-157)/(72-93) 150/93 (12/24 0430) SpO2:  [98 %-99 %] 98 % (12/24 0430) Weight:  [67.3 kg (148 lb 5.9 oz)] 67.3 kg (148 lb 5.9 oz) (12/24 0430) Last BM Date:  (colostomy)  Intake/Output from previous day: 12/23 0701 - 12/24 0700 In: 240 [P.O.:240] Out: 1295 [Urine:1050; Drains:245] Intake/Output this shift: No intake/output data recorded.  Resp: clear to auscultation bilaterally Cardio: regular rate and rhythm GI: soft, mild tenderness. wound clean. no bleeding  Lab Results:   Recent Labs  01/02/16 0521 01/03/16 0527  WBC 17.0* 14.0*  HGB 10.1* 9.8*  HCT 33.1* 30.6*  PLT 433* 429*   BMET  Recent Labs  01/02/16 0521 01/03/16 0527  NA 145 142  K 4.0 4.0  CL 112* 110  CO2 27 26  GLUCOSE 121* 105*  BUN 19 14  CREATININE 0.73 0.75  CALCIUM 8.2* 8.0*   PT/INR  Recent Labs  01/02/16 0938 01/03/16 0527  LABPROT 76.3* 30.6*  INR 8.96* 2.86   ABG No results for input(s): PHART, HCO3 in the last 72 hours.  Invalid input(s): PCO2, PO2  Studies/Results: Dg Chest 2 View  Result Date: 01/02/2016 CLINICAL DATA:  Wheezing. EXAM: CHEST  2 VIEW COMPARISON:  12/31/2015 FINDINGS: Examination demonstrates adequate lung volumes with persistent hazy bibasilar opacification likely small bilateral pleural effusions without significant change. This likely associated bibasilar atelectasis. Cardiomediastinal silhouette is within normal. There is calcified plaque over the thoracic aorta. There are degenerative changes of the spine. IMPRESSION: Persistent bibasilar opacification compatible with small effusions and associated atelectasis. Aortic atherosclerosis. Electronically Signed   By: Elberta Fortisaniel  Boyle M.D.   On:  01/02/2016 13:43    Anti-infectives: Anti-infectives    Start     Dose/Rate Route Frequency Ordered Stop   12/24/15 1800  piperacillin-tazobactam (ZOSYN) IVPB 3.375 g     3.375 g 12.5 mL/hr over 240 Minutes Intravenous Every 8 hours 12/24/15 1016     12/24/15 1015  piperacillin-tazobactam (ZOSYN) IVPB 3.375 g     3.375 g 100 mL/hr over 30 Minutes Intravenous  Once 12/24/15 1013 12/24/15 1101      Assessment/Plan: s/p Procedure(s): EXPLORATORY LAPAROTOMY, SIGMOID COLECTOMY, COLOSTOMY (N/A) Advance diet  Monitor INR. Was 9 yesterday now down to 3 Continue dressing changes Wbc coming down. No obvious infection  LOS: 10 days    TOTH III,PAUL S 01/03/2016

## 2016-01-04 ENCOUNTER — Encounter: Payer: Self-pay | Admitting: Internal Medicine

## 2016-01-04 DIAGNOSIS — K5733 Diverticulitis of large intestine without perforation or abscess with bleeding: Secondary | ICD-10-CM | POA: Diagnosis not present

## 2016-01-04 DIAGNOSIS — I10 Essential (primary) hypertension: Secondary | ICD-10-CM | POA: Diagnosis not present

## 2016-01-04 DIAGNOSIS — I482 Chronic atrial fibrillation: Secondary | ICD-10-CM | POA: Diagnosis not present

## 2016-01-25 ENCOUNTER — Ambulatory Visit: Payer: Medicare Other | Admitting: Cardiology

## 2016-01-25 NOTE — Progress Notes (Deleted)
Electrophysiology Office Note   Date:  01/25/2016   ID:  Candace Cain, DOB [birth year], MRN [id number]  PCP:  Duane Lope, MD Primary Electrophysiologist:  Danecia Underdown Jorja Loa, MD    No chief complaint on file.    History of Present Illness: Candace Cain is a 83 y.o. female who presents today for electrophysiology evaluation.   History of atrial fibrillation and hypertension. She presents today for evaluation of her atrial fibrillation as well as some swelling and shortness of breath at night. She previously saw her primary physician who put her on 20 mg of Lasix a day. She says that she has lost 4 pounds since that time. She is 6 pounds away from her normal weight os on her home scale. She is feeling much better with quite a bit less shortness of breath and lower extremity edema since starting the Lasix.   Today, she denies symptoms of palpitations, chest pain, shortness of breath, orthopnea, PND, lower extremity edema, claudication, dizziness, presyncope, syncope, bleeding, or neurologic sequela. The patient is tolerating medications without difficulties and is otherwise without complaint today.    Past Medical History:  Diagnosis Date  . Alopecia   . Atrial fibrillation (HCC)   . Bradycardia   . Unspecified essential hypertension    Past Surgical History:  Procedure Laterality Date  . HIP ARTHROPLASTY Right 04/27/2014   Procedure: ARTHROPLASTY CEMENTED HEMI HIP;  Surgeon: Marcene Corning, MD;  Location: [facility];  Service: Orthopedics;  Laterality: Right;  . hysterectomy, unspecifed area    . LAPAROTOMY N/A 12/25/2015   Procedure: EXPLORATORY LAPAROTOMY, SIGMOID COLECTOMY, COLOSTOMY;  Surgeon: Harriette Bouillon, MD;  Location: [facility];  Service: General;  Laterality: N/A;  . neuropathy       Current Outpatient Prescriptions  Medication Sig Dispense Refill  . acetaminophen (TYLENOL) 325 MG tablet Take 2 tablets (650 mg total) by mouth every 6 (six) hours as needed for mild pain or  moderate pain. 30 tablet 0  . amLODipine (NORVASC) 5 MG tablet Take 5 mg by mouth daily.     Marland Kitchen amoxicillin-clavulanate (AUGMENTIN) 875-125 MG tablet Take 1 tablet by mouth 2 (two) times daily. 6 tablet 0  . b complex vitamins capsule Take 1 capsule by mouth daily.    . Calcium Carbonate-Vitamin D (TH CALCIUM CARBONATE-VITAMIN D) 600-400 MG-UNIT per tablet Take 2 tablets by mouth daily.      . feeding supplement, GLUCERNA SHAKE, (GLUCERNA SHAKE) LIQD Take 237 mLs by mouth 3 (three) times daily between meals.  0  . levothyroxine (SYNTHROID, LEVOTHROID) 25 MCG tablet Take 25 mcg by mouth daily before breakfast.    . losartan (COZAAR) 100 MG tablet Take 100 mg by mouth daily.    Marland Kitchen oxyCODONE (OXY IR/ROXICODONE) 5 MG immediate release tablet Take 1-2 tablets (5-10 mg total) by mouth every 4 (four) hours as needed for moderate pain or severe pain. 15 tablet 0  . senna (SENOKOT) 8.6 MG TABS tablet Take 1 tablet (8.6 mg total) by mouth daily. 120 each 0  . simethicone (MYLICON) 80 MG chewable tablet Chew 1 tablet (80 mg total) by mouth every 4 (four) hours as needed for flatulence. 30 tablet 0  . vitamin B-12 (CYANOCOBALAMIN) 1000 MCG tablet Take 1,000 mcg by mouth daily.    Marland Kitchen warfarin (COUMADIN) 2.5 MG tablet Take 2.5 mg by mouth every evening.     No current facility-administered medications for this visit.     Allergies:   Patient has no known allergies.  Social History:  The patient  reports that she has quit smoking. She has never used smokeless tobacco. She reports that she drinks alcohol. She reports that she does not use drugs.   Family History:  The patient's family history includes Cancer in her maternal grandfather; Colon cancer in her mother; Heart disease in her brother, father, and sister; Prostate cancer in her brother.    ROS:  Please see the history of present illness.   Otherwise, review of systems is positive for ***.   All other systems are reviewed and negative.    PHYSICAL  EXAM: VS:  There were no vitals taken for this visit. , BMI There is no height or weight on file to calculate BMI. GEN: Well nourished, well developed, in no acute distress  HEENT: normal  Neck: no JVD, carotid bruits, or masses Cardiac: ***iRRR; no murmurs, rubs, or gallops,no edema  Respiratory:  clear to auscultation bilaterally, normal work of breathing GI: soft, nontender, nondistended, + BS MS: no deformity or atrophy  Skin: warm and dry Neuro:  Strength and sensation are intact Psych: euthymic mood, full affect  EKG:  EKG is ordered today. Personal review of the ekg ordered *** shows sinus rhythm, APCs, 1 degree AV block***  Recent Labs: 12/26/2015: TSH 0.725 01/03/2016: ALT 19; BUN 14; Creatinine, Ser 0.75; Hemoglobin 9.8; Magnesium 1.9; Platelets 429; Potassium 4.0; Sodium 142    Lipid Panel     Component Value Date/Time   CHOL 179 06/05/2010 0420   TRIG 82 06/05/2010 0420   HDL 88 06/05/2010 0420   CHOLHDL 2.0 06/05/2010 0420   VLDL 16 06/05/2010 0420   LDLCALC  06/05/2010 0420    75        Total Cholesterol/HDL:CHD Risk Coronary Heart Disease Risk Table                     Men   Women  1/2 Average Risk   3.4   3.3  Average Risk       5.0   4.4  2 X Average Risk   9.6   7.1  3 X Average Risk  23.4   11.0        Use the calculated Patient Ratio above and the CHD Risk Table to determine the patient's CHD Risk.        ATP III CLASSIFICATION (LDL):  <100     mg/dL   Optimal  960-454  mg/dL   Near or Above                    Optimal  130-159  mg/dL   Borderline  098-119  mg/dL   High  >147     mg/dL   Very High     Wt Readings from Last 3 Encounters:  01/03/16 148 lb 5.9 oz (67.3 kg)  10/20/15 129 lb 6.4 oz (58.7 kg)  04/26/14 116 lb 6.5 oz (52.8 kg)      Other studies Reviewed: Additional studies/ records that were reviewed today include: PCP notes   ASSESSMENT AND PLAN:  1.  Paroxysmal atrial fibrillation: on warfarin for anticoagulation.  Currently in sinus rhythm today. We did discuss the option of further therapy with antiarrhythmic medications to keep her in sinus rhythm, though she does not feel that this is necessary at this time.  This patients CHA2DS2-VASc Score and unadjusted Ischemic Stroke Rate (% per year) is equal to 4.8 % stroke rate/year from a score of 4  Above score calculated as 1 point each if present [CHF, HTN, DM, Vascular=MI/PAD/Aortic Plaque, Age if 65-74, or Female] Above score calculated as 2 points each if present [Age > 75, or Stroke/TIA/TE]  2. Hypertension: Well controlled today  3. Lower extremity edema: possibly due to heart failure versus venous stasis and venous disease. I discussed the possibility of an echocardiogram, but she feels that therapy with Lasix has been improving her symptoms and therefore she declines at this time.    Current medicines are reviewed at length with the patient today.   The patient does not have concerns regarding her medicines.  The following changes were made today:  none  Labs/ tests ordered today include:  No orders of the defined types were placed in this encounter.    Disposition:   FU with Brasen Bundren 3 months  Signed, Nunzio Banet Jorja LoaMartin Kavish Lafitte, MD  01/25/2016 7:55 AM     [facility] [address] [place] [address] [phone] (office) [phone] (fax)

## 2016-01-28 DIAGNOSIS — I4891 Unspecified atrial fibrillation: Secondary | ICD-10-CM | POA: Diagnosis not present

## 2016-01-28 DIAGNOSIS — Z7901 Long term (current) use of anticoagulants: Secondary | ICD-10-CM | POA: Diagnosis not present

## 2016-01-29 DIAGNOSIS — I4891 Unspecified atrial fibrillation: Secondary | ICD-10-CM | POA: Diagnosis not present

## 2016-02-03 DIAGNOSIS — K5720 Diverticulitis of large intestine with perforation and abscess without bleeding: Secondary | ICD-10-CM | POA: Diagnosis not present

## 2016-02-03 DIAGNOSIS — I10 Essential (primary) hypertension: Secondary | ICD-10-CM | POA: Diagnosis not present

## 2016-02-05 DIAGNOSIS — Z48815 Encounter for surgical aftercare following surgery on the digestive system: Secondary | ICD-10-CM | POA: Diagnosis not present

## 2016-02-05 DIAGNOSIS — I10 Essential (primary) hypertension: Secondary | ICD-10-CM | POA: Diagnosis not present

## 2016-02-05 DIAGNOSIS — E46 Unspecified protein-calorie malnutrition: Secondary | ICD-10-CM | POA: Diagnosis not present

## 2016-02-05 DIAGNOSIS — I4891 Unspecified atrial fibrillation: Secondary | ICD-10-CM | POA: Diagnosis not present

## 2016-02-05 DIAGNOSIS — Z433 Encounter for attention to colostomy: Secondary | ICD-10-CM | POA: Diagnosis not present

## 2016-02-05 DIAGNOSIS — R262 Difficulty in walking, not elsewhere classified: Secondary | ICD-10-CM | POA: Diagnosis not present

## 2016-02-10 DIAGNOSIS — I10 Essential (primary) hypertension: Secondary | ICD-10-CM | POA: Diagnosis not present

## 2016-02-10 DIAGNOSIS — Z7901 Long term (current) use of anticoagulants: Secondary | ICD-10-CM | POA: Diagnosis not present

## 2016-02-10 DIAGNOSIS — Z933 Colostomy status: Secondary | ICD-10-CM | POA: Diagnosis not present

## 2016-02-10 DIAGNOSIS — R609 Edema, unspecified: Secondary | ICD-10-CM | POA: Diagnosis not present

## 2016-02-10 DIAGNOSIS — M2550 Pain in unspecified joint: Secondary | ICD-10-CM | POA: Diagnosis not present

## 2016-02-10 DIAGNOSIS — E039 Hypothyroidism, unspecified: Secondary | ICD-10-CM | POA: Diagnosis not present

## 2016-02-10 DIAGNOSIS — K5720 Diverticulitis of large intestine with perforation and abscess without bleeding: Secondary | ICD-10-CM | POA: Diagnosis not present

## 2016-03-14 ENCOUNTER — Encounter (HOSPITAL_COMMUNITY): Payer: Self-pay

## 2016-03-23 DIAGNOSIS — Z01818 Encounter for other preprocedural examination: Secondary | ICD-10-CM | POA: Diagnosis not present

## 2016-03-23 DIAGNOSIS — Z933 Colostomy status: Secondary | ICD-10-CM | POA: Diagnosis not present

## 2016-03-24 ENCOUNTER — Ambulatory Visit: Payer: Self-pay | Admitting: Surgery

## 2016-03-24 NOTE — H&P (Signed)
Candace Cain 03/23/2016 10:25 AM Location: [facility] Patient #: [id number] DOB: 01-Mar-1920 Married / Language: [place] / Race: White Female  Patient Care Team: Daisy Floro, MD as PCP - General (Family Medicine) Harriette Bouillon, MD as Consulting Physician (General Surgery) Will Jorja Loa, MD as Consulting Physician (Cardiology)   History of Present Illness [name] MD; 03/24/2016 7:16 AM) The patient is a 83 year old female who presents with diverticulitis. Note for "Diverticulitis": Patient comes in today for a second opinion for possible colostomy takedown.  83 year old female that underwent emergent exploratory laparotomy and sigmoid colectomy with colostomy December 25, 2015 for perforated sigmoid diverticulitis. Pathology benign.  She is chronic atrial fibrillation on chronic anticoagulation. I believe Dr. Elberta Fortis follows from an electrophysiology standpoint. Dr. Duane Lope is her primary care physician.  Pleasant elderly woman originally from [place]. Had perforated diverticulitis and peritonitis. Underwent emergent laparotomy and colectomy with colostomy Hartmann procedure. 10 day hospital stay. Eventually recovered and discharged home. Sent today with her niece, Sheryl. She is back to doing most dated activities. She used to go to the gym about three times a week. Her heart hearing. She has had some balance issues. Feels like that is a little worse since surgery. No falls out. She has been debating about getting a walker. The colostomy appliance stays on quite well. Usually changes it once a week. Empties the bag 1-3 times a day. She does have some chronic hip bursitis of that limits her ability to walk far distances but certainly can do a corner half mile and was working at the gym. No exertional chest pain or shortness of breath. She would like to get the colostomy taken down and this is unsightly and bothersome. Partner had concerns given  her advanced age and cardiac issues. Dr. Luisa Hart referred her to me to see if she would be a candidate for a minimally invasive and hopefully less morbid approach.     Diagnosis Colon, segmental resection, Sigmoid - BENIGN COLORECTAL MUCOSA WITH DIVERTICULITIS AND ACUTE SEROSITIS. - TWO BENIGN LYMPH NODES (0/2). - NO DYSPLASIA OR TUMOR SEEN. - SEE COMMENT.  Microscopic Comment Sections demonstrate benign colorectal mucosa with diverticular formation. Adjacent to one of the diverticuli, there is acute and chronic inflammation. The inflammation extends to the serosal surface with acute serositis present. The findings are consistent with the above diagnosis. (RAH:gt, 12/28/15) [name] MD Pathologist, Electronic Signature (Case signed 12/28/2015) Specimen Tobenna Needs and Clinical Information Specimen(s) Obtained: Colon, segmental resection, Sigmoid Specimen Clinical   Medication History (Alisha Spillers, CMA; 03/23/2016 10:27 AM) Medications Reconciled    Vitals (Alisha Spillers CMA; 03/23/2016 10:26 AM) 03/23/2016 10:25 AM Weight: 118 lb Height: 62in Body Surface Area: 1.53 m Body Mass Index: 21.58 kg/m  Pulse: 50 (Regular)  BP: 128/82 (Sitting, Left Arm, Standard)      Physical Exam [name] MD; 03/23/2016 11:07 AM)  General Mental Status-Alert. General Appearance-Not in acute distress, Not Sickly. Orientation-Oriented X3. Hydration-Well hydrated. Voice-Normal.  Integumentary Global Assessment Upon inspection and palpation of skin surfaces of the - Axillae: non-tender, no inflammation or ulceration, no drainage. and Distribution of scalp and body hair is normal. General Characteristics Temperature - normal warmth is noted.  Head and Neck Head-normocephalic, atraumatic with no lesions or palpable masses. Face Global Assessment - atraumatic, no absence of expression. Neck Global Assessment - no abnormal movements, no bruit  auscultated on the right, no bruit auscultated on the left, no decreased range of motion, non-tender. Trachea-midline.  Thyroid Gland Characteristics - non-tender.  Eye Eyeball - Left-Extraocular movements intact, No Nystagmus. Eyeball - Right-Extraocular movements intact, No Nystagmus. Cornea - Left-No Hazy. Cornea - Right-No Hazy. Sclera/Conjunctiva - Left-No scleral icterus, No Discharge. Sclera/Conjunctiva - Right-No scleral icterus, No Discharge. Pupil - Left-Direct reaction to light normal. Pupil - Right-Direct reaction to light normal. Note: Wears glasses. Vision corrected  ENMT Ears Pinna - Left - no drainage observed, no generalized tenderness observed. Right - no drainage observed, no generalized tenderness observed. Nose and Sinuses External Inspection of the Nose - no destructive lesion observed. Inspection of the nares - Left - quiet respiration. Right - quiet respiration. Mouth and Throat Lips - Upper Lip - no fissures observed, no pallor noted. Lower Lip - no fissures observed, no pallor noted. Nasopharynx - no discharge present. Oral Cavity/Oropharynx - Tongue - no dryness observed. Oral Mucosa - no cyanosis observed. Hypopharynx - no evidence of airway distress observed. Note: Hard of hearing  Chest and Lung Exam Inspection Movements - Normal and Symmetrical. Accessory muscles - No use of accessory muscles in breathing. Palpation Palpation of the chest reveals - Non-tender. Auscultation Breath sounds - Normal and Clear.  Cardiovascular Auscultation Rhythm - Regular. Murmurs & Other Heart Sounds - Auscultation of the heart reveals - No Murmurs and No Systolic Clicks.  Abdomen Inspection Inspection of the abdomen reveals - No Visible peristalsis and No Abnormal pulsations. Umbilicus - No Bleeding, No Urine drainage. Palpation/Percussion Palpation and Percussion of the abdomen reveal - Soft, Non Tender, No Rebound tenderness, No Rigidity  (guarding) and No Cutaneous hyperesthesia. Note: Midline incision closed. Left lower quadrant colostomy. Minimal bulging with standing. Pink Rosebud. Abdomen soft. Nontender, nondistended. No guarding. No diastasis. No umbilical nor other hernias  Female Genitourinary Sexual Maturity Tanner 5 - Adult hair pattern. Note: No vaginal bleeding nor discharge  Rectal Note: A few small external anal tags. Normal sphincter tone. Soft stool in rectal vault. No fissure. No fistula. No chronic hemorrhoids.  Peripheral Vascular Upper Extremity Inspection - Left - No Cyanotic nailbeds, Not Ischemic. Right - No Cyanotic nailbeds, Not Ischemic.  Neurologic Neurologic evaluation reveals -normal attention span and ability to concentrate, able to name objects and repeat phrases. Appropriate fund of knowledge , normal sensation and normal coordination. Mental Status Affect - not angry, not paranoid. Cranial Nerves-Normal Bilaterally. Gait-Normal.  Neuropsychiatric Mental status exam performed with findings of-able to articulate well with normal speech/language, rate, volume and coherence, thought content normal with ability to perform basic computations and apply abstract reasoning and no evidence of hallucinations, delusions, obsessions or homicidal/suicidal ideation.  Musculoskeletal Global Assessment Spine, Ribs and Pelvis - no instability, subluxation or laxity. Right Upper Extremity - no instability, subluxation or laxity.  Lymphatic Head & Neck  General Head & Neck Lymphatics: Bilateral - Description - No Localized lymphadenopathy. Axillary  General Axillary Region: Bilateral - Description - No Localized lymphadenopathy. Femoral & Inguinal  Generalized Femoral & Inguinal Lymphatics: Left - Description - No Localized lymphadenopathy. Right - Description - No Localized lymphadenopathy.    Assessment & Plan [name] MD; 03/24/2016 7:26 AM)  PERFORATED DIVERTICULUM OF  LARGE INTESTINE (K57.20) Impression: Recovering status post emergency laparotomy and sigmoid colectomy for perforated diverticulitis.  She is interested in colostomy takedown. Certainly if technically feasible & reasonable to do a laparoscopic approach. Hopefully adhesions not too bad.  Biggest issue is her advanced age and risks of surgery. My hope would be that because she got through the laparotomy over the past  few months, the risks of the colostomy takedown would be less. She is active and tries to exercise regularly. Worked with rehab pretty well.  Nonetheless, I would like input from her primary care physician Dr. Tenny Crawoss. Also cardiac clearance to make a better opinion of the risks. by Dr Elberta Fortisamnitz or at least his group.  I would like her to get a colonoscopy to make sure there are no other problems. This could be set up the day before surgery so that way she would just have one bowel prep. Patient[facility].  Current Plans Pt Education - [facility] Diverticular Disease (AT) COLOSTOMY IN PLACE (Z93.3) Impression: Doubt a parastomal hernia. Seems to be functioning well.  I did note to the patient that she could keep the colostomy indefinitely. However, she finds it somewhat annoying and distasteful and would like to have colostomy takedown if feasible.  Current Plans Instructed to schedule colonoscopywith a gastroenterologist Pt Education - [facility] Ostomy HCI (Julius Matus): discussed with patient and provided information. Pt Education - [facility] Good Bowel Health (Avraj Lindroth) PREOP COLON - ENCOUNTER FOR PREOPERATIVE EXAMINATION FOR GENERAL SURGICAL PROCEDURE (Z01.818)  Current Plans I recommended obtaining preoperative cardiac clearance for recommendations on management of anticoagualtion perioperatively:  1. Timing of holding anticoagulation 2. Need for any bridge therapy (SQ enoxaparin, IV heparin, IV Aggrastat, etc) preop/postop. 3. Desired timing of resumption of  anticoagulation.  In general from Dr. Michaell CowingGross' standpoint:  Aspirin is okay to continue perioperatively (81mg  or 325mg ) & does not need to be held  Hold warfarin 5 dayspreoperatively. Consider PT/INR level on arrival to short stay the day of surgery. No need to check PT/INR level on preop visit  Hold P2Y12 inibitors such as clopidrogel (Plavix) 4 dayspreoperatively  Hold direct thrombin / factor Xa inhibitors (Xerelto, Pradaxa, Eliquis, etc) 2 dayspreoperatively  Request clearance by cardiology to better assess operative risk & see if a reevaluation, further workup, etc is needed. Also recommendations on how medications such as for anticoagulation and blood pressure should be managed/held/restarted after surgery.  You are being scheduled for surgery- Our schedulers will call you.  You should hear from our office's scheduling department within 5 working days about the location, date, and time of surgery. We try to make accommodations for patient's preferences in scheduling surgery, but sometimes the OR schedule or the surgeon's schedule prevents us from making those accommodations.  If you have not heard from our office [phone] in 5 working days, call the office and ask for your surgeon's nurse.  If you have other questions about your diagnosis, plan, or surgery, call the office and ask for your surgeon's nurse.  Written instructions provided Pt Education - [facility] Colon Bowel Prep 2015 Miralax/Antibiotics Started Neomycin Sulfate 500MG , 2 (two) Tablet SEE NOTE, #6, 03/23/2016, No Refill. Local Order: TAKE TWO TABLETS AT 2 PM, 3 PM, AND 10 PM THE DAY PRIOR TO SURGERY Started Flagyl 500MG , 2 (two) Tablet SEE NOTE, #6, 03/23/2016, No Refill. Local Order: Take at 2pm, 3pm, and 10pm the day prior to your colon operation Pt Education - [facility] Colectomy post-op instructions: discussed with patient and provided information. Pt Education - Pamphlet Given - Laparoscopic  Colorectal Surgery: discussed with patient and provided information.  Ardeth SportsmanSteven C. Truth Wolaver, M.D., F.A.C.S. Gastrointestinal and Minimally Invasive Surgery [facility] [address] [place] [address] [phone] Main / Paging

## 2016-03-30 ENCOUNTER — Telehealth: Payer: Self-pay | Admitting: Cardiology

## 2016-03-30 NOTE — Telephone Encounter (Signed)
Follow Up:   Candace Cain wants to know if you received the clearance she faxed on Friday(03-25-16)? She also wants to know if pt will need an appointment to be cleared?

## 2016-03-30 NOTE — Telephone Encounter (Signed)
Informed Dr. Gordy SaversGross's office that I have not received a clearance request.  They will fax today

## 2016-04-06 DIAGNOSIS — M7061 Trochanteric bursitis, right hip: Secondary | ICD-10-CM | POA: Diagnosis not present

## 2016-04-12 NOTE — Telephone Encounter (Signed)
2 request for clearance request.   Informed Dr. Gordy Savers office have not received request yet.  They will fax again.

## 2016-04-15 NOTE — Telephone Encounter (Signed)
Yesterday received clearance request, from [facility], for Laparoscopic ostomy takedown.  Called pt and lmtcb to arrange overdue OV before clearance can be given.

## 2016-04-19 NOTE — Telephone Encounter (Signed)
Follow Up   Pt states she does not want the surgery anymore and to forget about the surgery clearance. Does not request a call back unless there are further questions

## 2016-04-27 DIAGNOSIS — I10 Essential (primary) hypertension: Secondary | ICD-10-CM | POA: Diagnosis not present

## 2016-04-27 DIAGNOSIS — Z0000 Encounter for general adult medical examination without abnormal findings: Secondary | ICD-10-CM | POA: Diagnosis not present

## 2016-04-27 DIAGNOSIS — E039 Hypothyroidism, unspecified: Secondary | ICD-10-CM | POA: Diagnosis not present

## 2016-04-27 DIAGNOSIS — Z933 Colostomy status: Secondary | ICD-10-CM | POA: Diagnosis not present

## 2016-04-27 DIAGNOSIS — I482 Chronic atrial fibrillation: Secondary | ICD-10-CM | POA: Diagnosis not present

## 2016-04-27 DIAGNOSIS — K631 Perforation of intestine (nontraumatic): Secondary | ICD-10-CM | POA: Diagnosis not present

## 2016-05-13 DIAGNOSIS — M25551 Pain in right hip: Secondary | ICD-10-CM | POA: Diagnosis not present

## 2016-05-19 DIAGNOSIS — M25511 Pain in right shoulder: Secondary | ICD-10-CM | POA: Diagnosis not present

## 2016-05-23 DIAGNOSIS — R0789 Other chest pain: Secondary | ICD-10-CM | POA: Diagnosis not present

## 2016-05-23 DIAGNOSIS — K631 Perforation of intestine (nontraumatic): Secondary | ICD-10-CM | POA: Diagnosis not present

## 2016-05-23 DIAGNOSIS — I10 Essential (primary) hypertension: Secondary | ICD-10-CM | POA: Diagnosis not present

## 2016-05-23 DIAGNOSIS — Z933 Colostomy status: Secondary | ICD-10-CM | POA: Diagnosis not present

## 2016-06-29 DIAGNOSIS — I482 Chronic atrial fibrillation: Secondary | ICD-10-CM | POA: Diagnosis not present

## 2016-06-29 DIAGNOSIS — Z7901 Long term (current) use of anticoagulants: Secondary | ICD-10-CM | POA: Diagnosis not present

## 2016-06-30 DIAGNOSIS — Z933 Colostomy status: Secondary | ICD-10-CM | POA: Diagnosis not present

## 2016-06-30 DIAGNOSIS — K631 Perforation of intestine (nontraumatic): Secondary | ICD-10-CM | POA: Diagnosis not present

## 2016-08-03 DIAGNOSIS — I10 Essential (primary) hypertension: Secondary | ICD-10-CM | POA: Diagnosis not present

## 2016-08-03 DIAGNOSIS — D509 Iron deficiency anemia, unspecified: Secondary | ICD-10-CM | POA: Diagnosis not present

## 2016-08-03 DIAGNOSIS — Z7901 Long term (current) use of anticoagulants: Secondary | ICD-10-CM | POA: Diagnosis not present

## 2016-08-03 DIAGNOSIS — I482 Chronic atrial fibrillation: Secondary | ICD-10-CM | POA: Diagnosis not present

## 2016-08-23 DIAGNOSIS — Z933 Colostomy status: Secondary | ICD-10-CM | POA: Diagnosis not present

## 2016-08-23 DIAGNOSIS — K631 Perforation of intestine (nontraumatic): Secondary | ICD-10-CM | POA: Diagnosis not present

## 2016-09-01 DIAGNOSIS — Z7901 Long term (current) use of anticoagulants: Secondary | ICD-10-CM | POA: Diagnosis not present

## 2016-09-01 DIAGNOSIS — R3915 Urgency of urination: Secondary | ICD-10-CM | POA: Diagnosis not present

## 2016-09-01 DIAGNOSIS — Z23 Encounter for immunization: Secondary | ICD-10-CM | POA: Diagnosis not present

## 2016-09-01 DIAGNOSIS — I482 Chronic atrial fibrillation: Secondary | ICD-10-CM | POA: Diagnosis not present

## 2016-10-04 DIAGNOSIS — K631 Perforation of intestine (nontraumatic): Secondary | ICD-10-CM | POA: Diagnosis not present

## 2016-10-04 DIAGNOSIS — Z933 Colostomy status: Secondary | ICD-10-CM | POA: Diagnosis not present

## 2016-10-27 DIAGNOSIS — Z7901 Long term (current) use of anticoagulants: Secondary | ICD-10-CM | POA: Diagnosis not present

## 2016-10-27 DIAGNOSIS — R252 Cramp and spasm: Secondary | ICD-10-CM | POA: Diagnosis not present

## 2016-10-27 DIAGNOSIS — I482 Chronic atrial fibrillation: Secondary | ICD-10-CM | POA: Diagnosis not present

## 2016-10-29 IMAGING — CR DG CHEST 2V
2 series · 2 of 2 positions shown · non-contrast
Comparison: 06/04/2010

CLINICAL DATA: Cough for the last week. Hemoptysis. Fever. Abnormal
lung exam on the right.

EXAM:
CHEST  2 VIEW

[w chest pa]
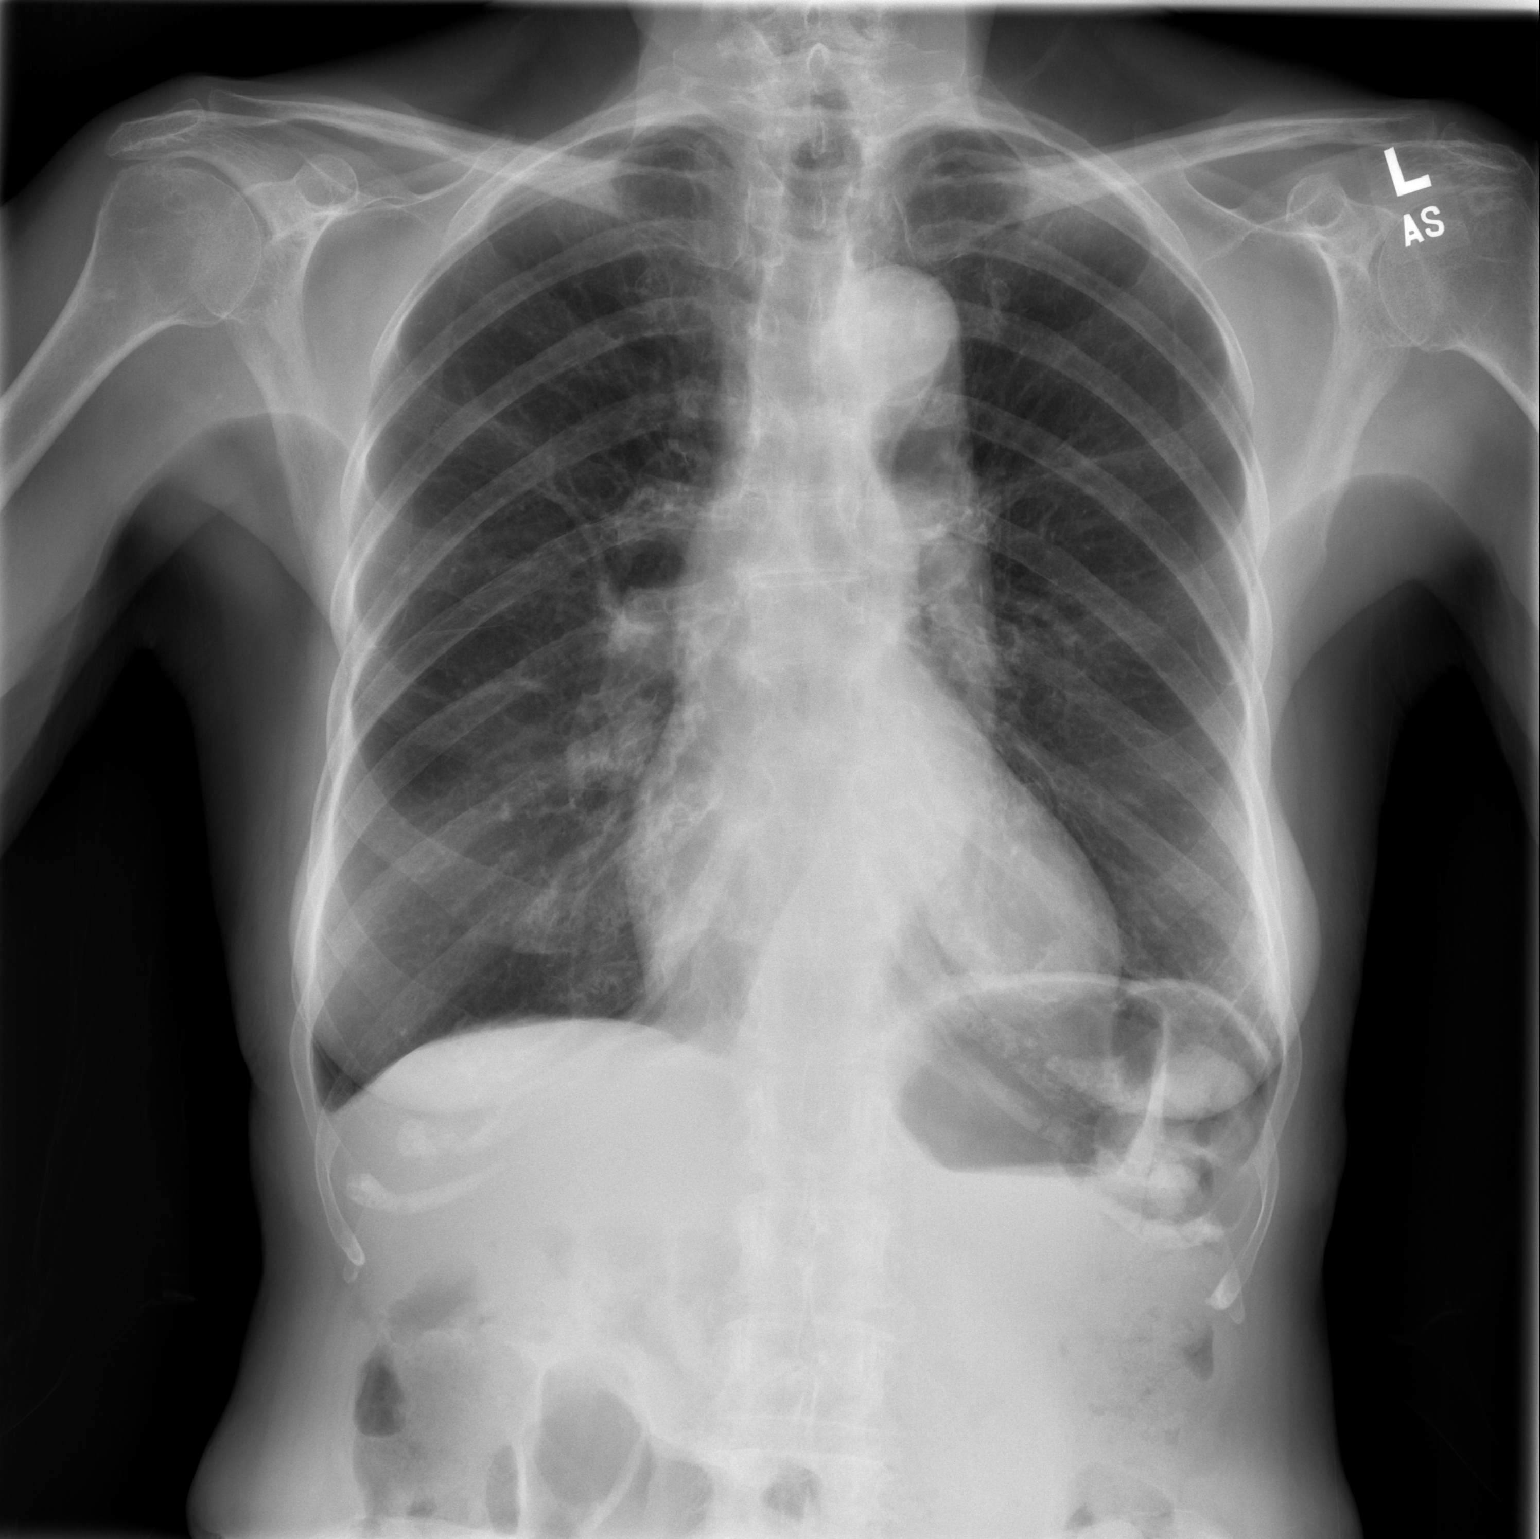

[w chest lat]
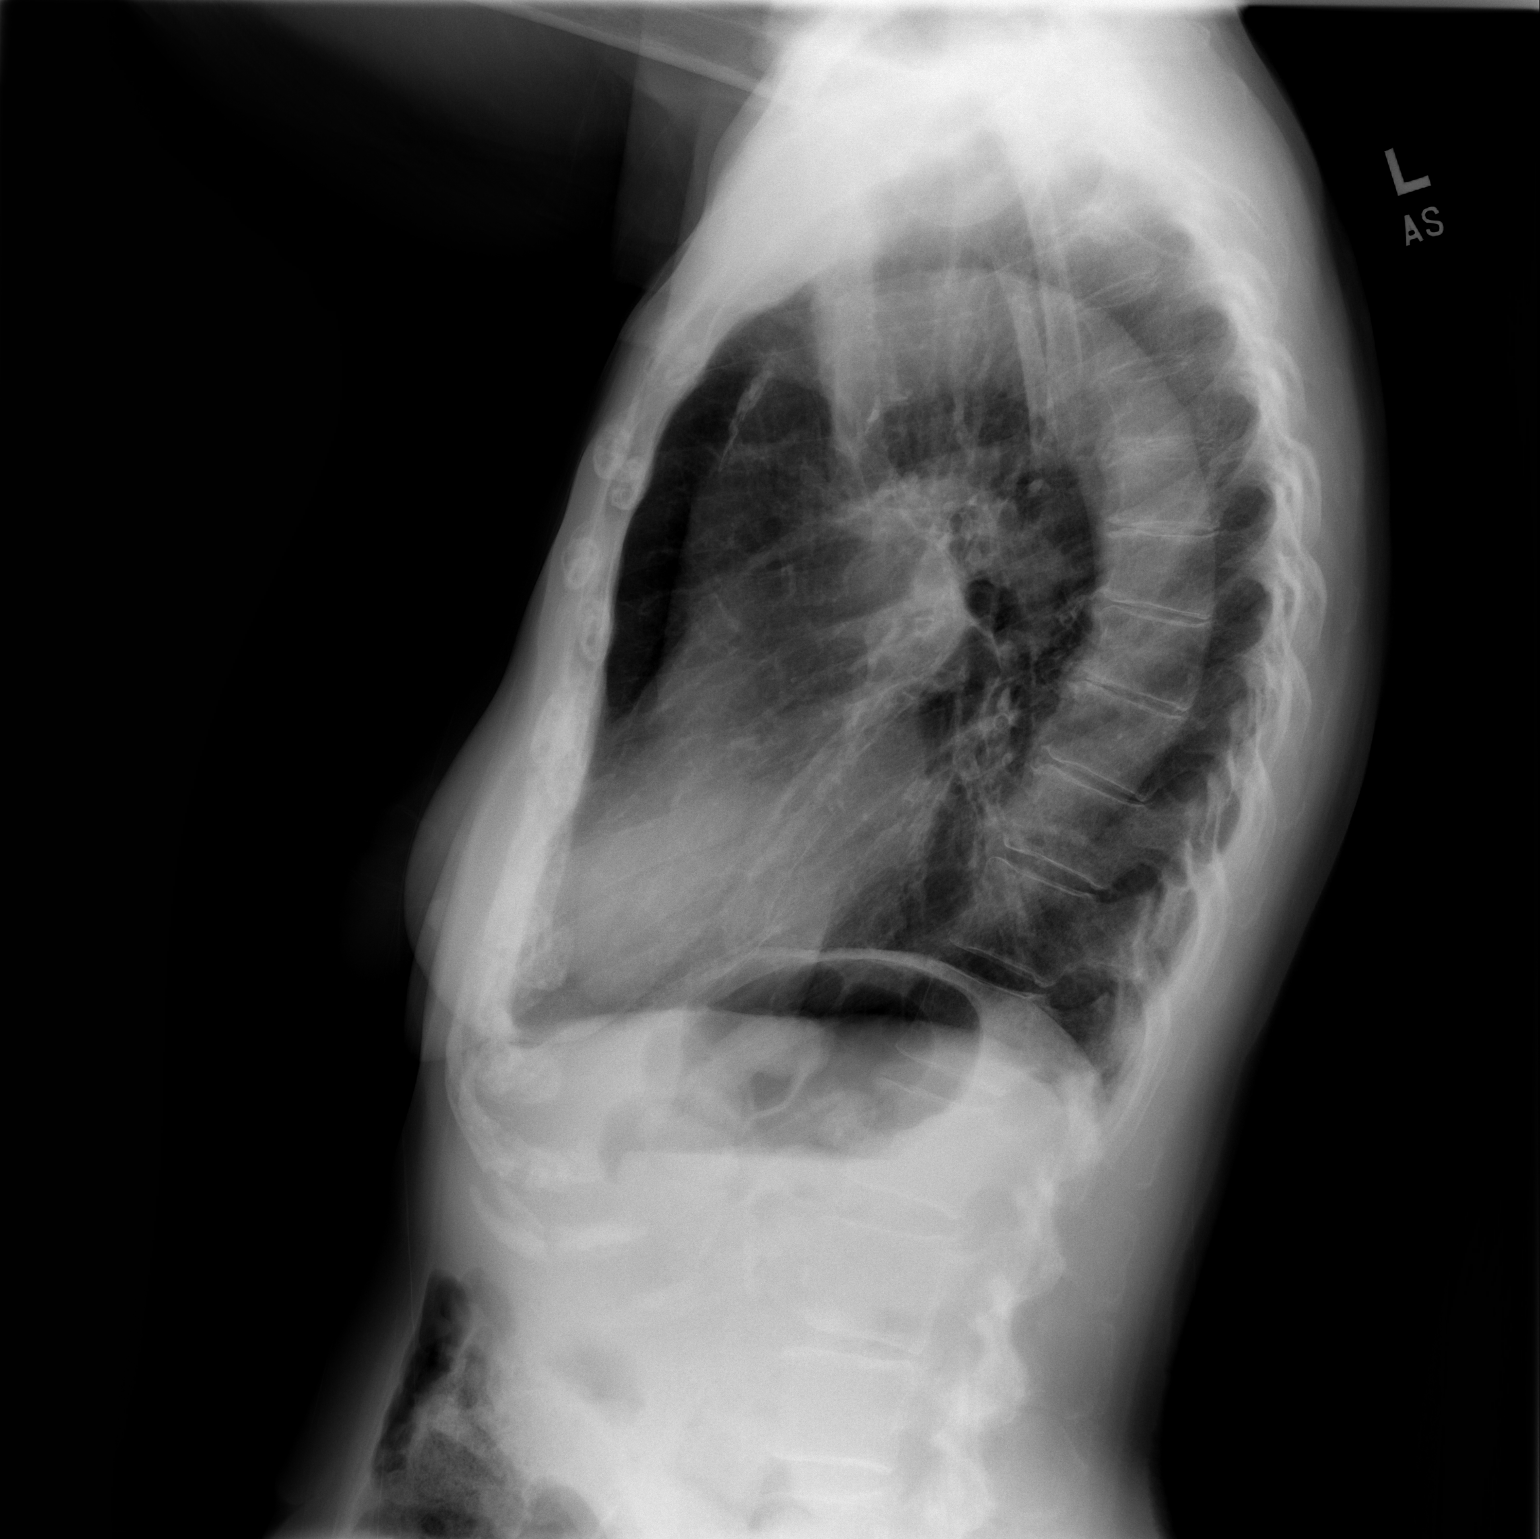

[2 of 2 positions shown; findings below may reference images not displayed]

FINDINGS: Heart size is normal. The aorta is unfolded. There may be central
bronchial thickening but there is no infiltrate, collapse or
effusion. No acute bony finding.
IMPRESSION: Possible bronchitis but no infiltrate or collapse.

## 2016-10-30 IMAGING — CR DG CHEST 2V
2 series · 2 of 2 positions shown · non-contrast
Comparison: 03/26/2014

CLINICAL DATA: Productive cough

EXAM:
CHEST  2 VIEW

[w chest lat]
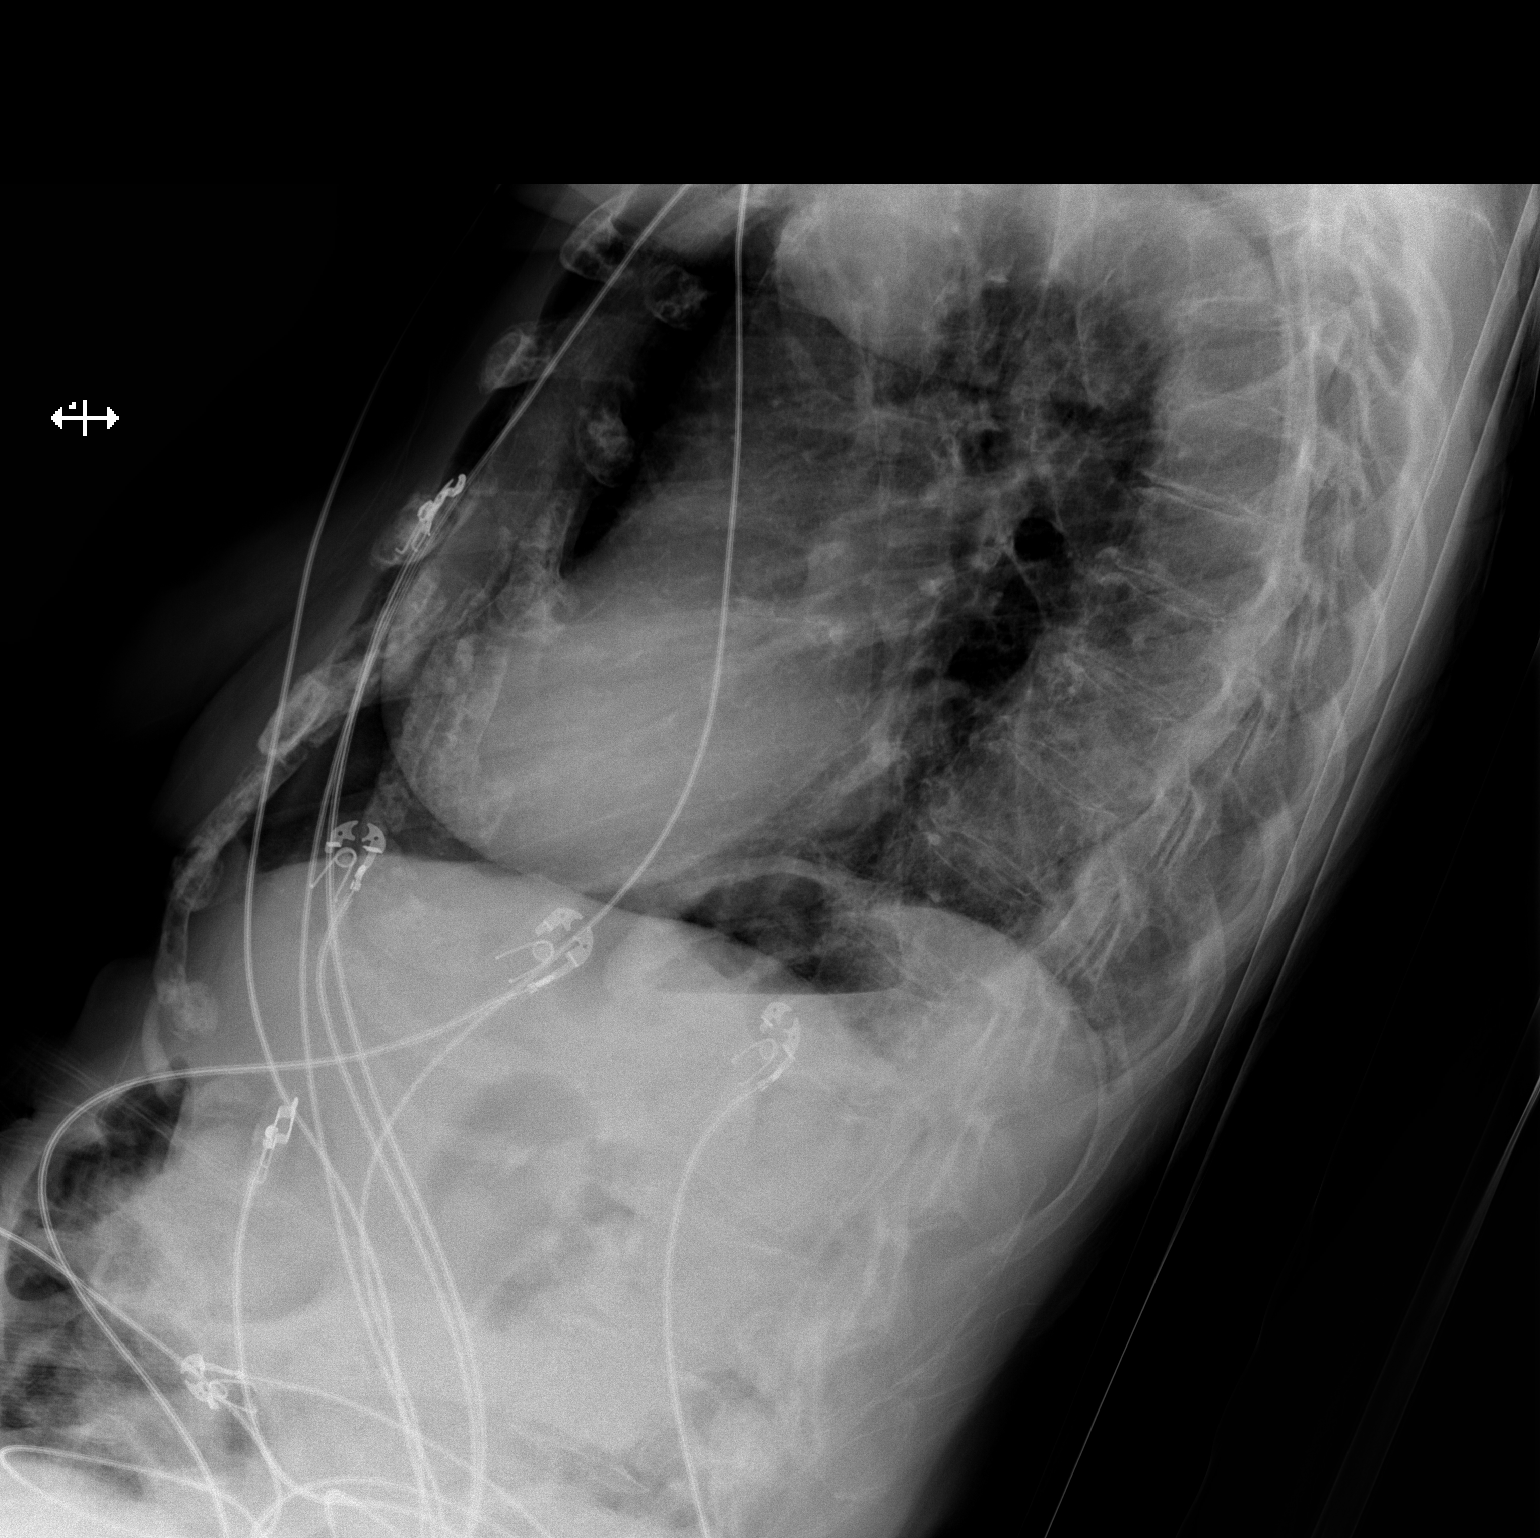

[x chest ap]
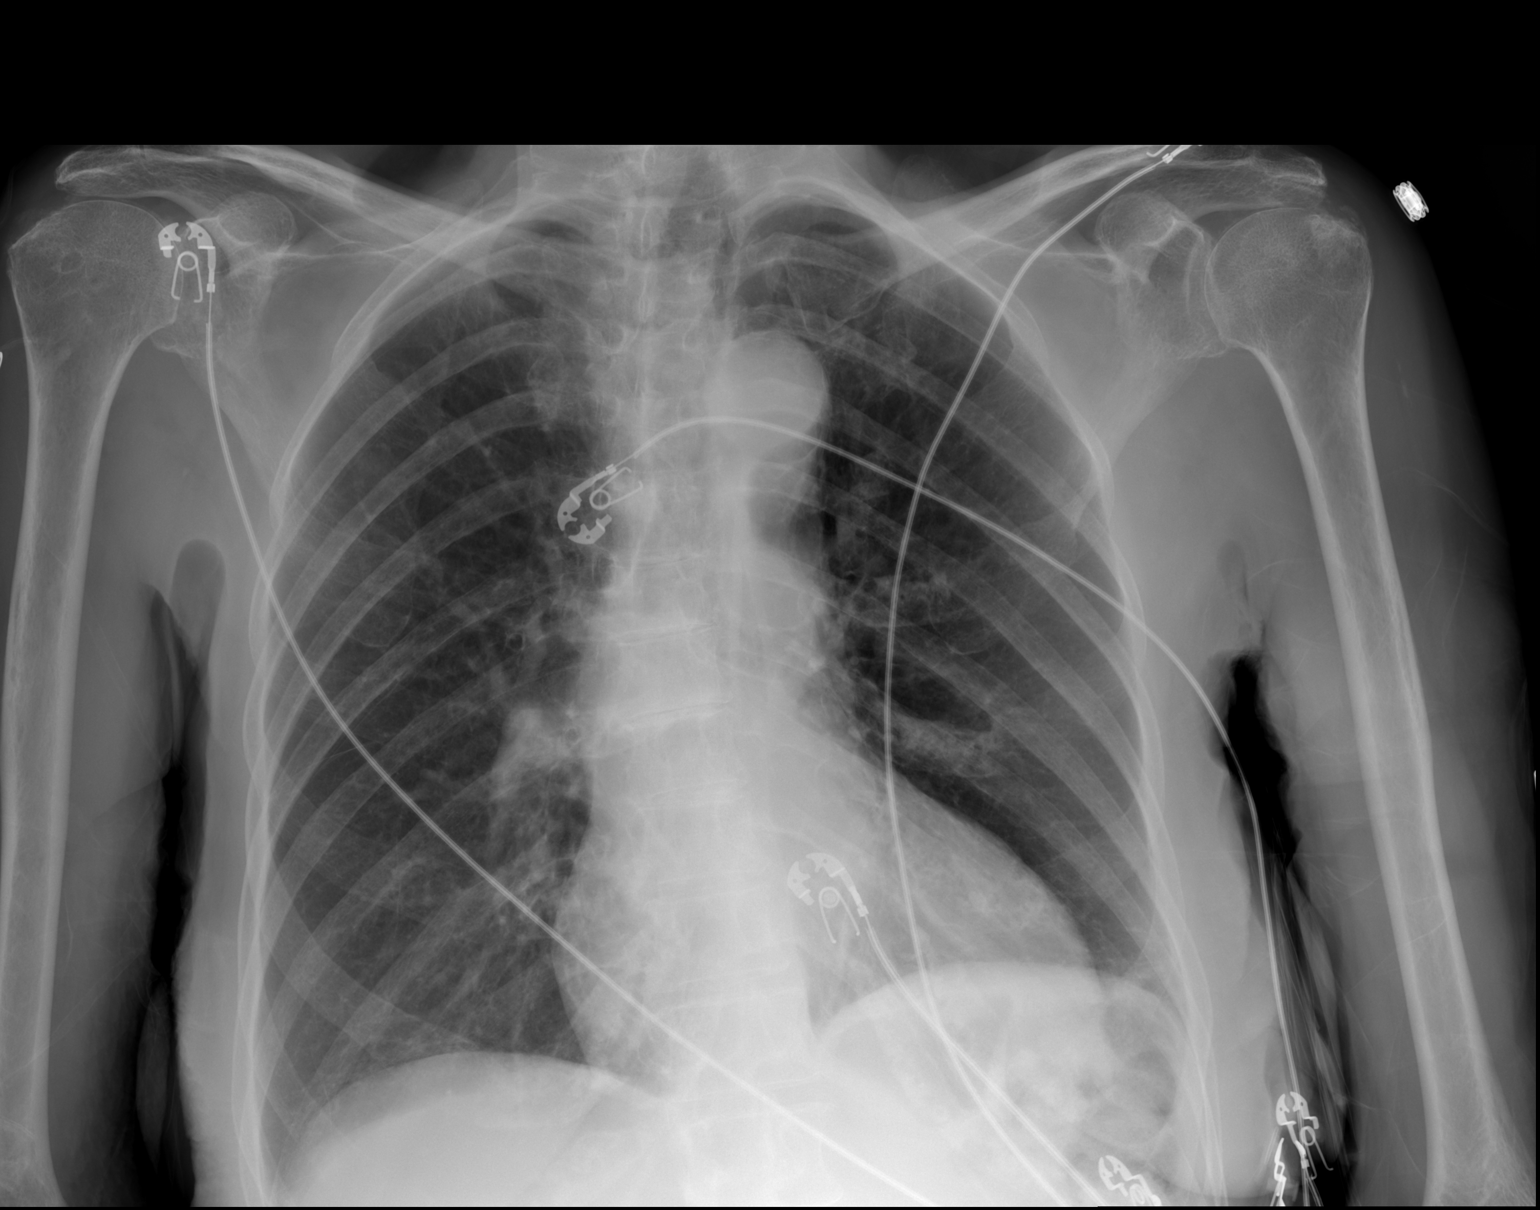

[2 of 2 positions shown; findings below may reference images not displayed]

FINDINGS: Cardiac shadow is stable. The lungs are well aerated bilaterally.
Increased density is noted in the posterior costophrenic angle on
right consistent with an early infiltrate. This was not well
appreciated on the prior exam. No acute bony abnormality is seen.
IMPRESSION: Early infiltrate in the right lung base posteriorly.

## 2016-11-28 DIAGNOSIS — Z7901 Long term (current) use of anticoagulants: Secondary | ICD-10-CM | POA: Diagnosis not present

## 2016-11-28 DIAGNOSIS — I482 Chronic atrial fibrillation: Secondary | ICD-10-CM | POA: Diagnosis not present

## 2016-12-26 DIAGNOSIS — I482 Chronic atrial fibrillation: Secondary | ICD-10-CM | POA: Diagnosis not present

## 2016-12-26 DIAGNOSIS — B351 Tinea unguium: Secondary | ICD-10-CM | POA: Diagnosis not present

## 2017-01-12 DIAGNOSIS — Z7901 Long term (current) use of anticoagulants: Secondary | ICD-10-CM | POA: Diagnosis not present

## 2017-01-13 DIAGNOSIS — K435 Parastomal hernia without obstruction or  gangrene: Secondary | ICD-10-CM | POA: Diagnosis not present

## 2017-01-13 DIAGNOSIS — Z933 Colostomy status: Secondary | ICD-10-CM | POA: Diagnosis not present

## 2017-01-23 DIAGNOSIS — Z933 Colostomy status: Secondary | ICD-10-CM | POA: Diagnosis not present

## 2017-01-30 DIAGNOSIS — K631 Perforation of intestine (nontraumatic): Secondary | ICD-10-CM | POA: Diagnosis not present

## 2017-01-30 DIAGNOSIS — Z933 Colostomy status: Secondary | ICD-10-CM | POA: Diagnosis not present

## 2017-01-30 DIAGNOSIS — I5022 Chronic systolic (congestive) heart failure: Secondary | ICD-10-CM | POA: Diagnosis not present

## 2017-02-13 DIAGNOSIS — I482 Chronic atrial fibrillation: Secondary | ICD-10-CM | POA: Diagnosis not present

## 2017-02-13 DIAGNOSIS — Z7901 Long term (current) use of anticoagulants: Secondary | ICD-10-CM | POA: Diagnosis not present

## 2017-02-22 DIAGNOSIS — Z933 Colostomy status: Secondary | ICD-10-CM | POA: Diagnosis not present

## 2017-02-22 DIAGNOSIS — K631 Perforation of intestine (nontraumatic): Secondary | ICD-10-CM | POA: Diagnosis not present

## 2017-04-10 DIAGNOSIS — Z7901 Long term (current) use of anticoagulants: Secondary | ICD-10-CM | POA: Diagnosis not present

## 2017-04-10 DIAGNOSIS — I482 Chronic atrial fibrillation: Secondary | ICD-10-CM | POA: Diagnosis not present

## 2017-05-12 DIAGNOSIS — N390 Urinary tract infection, site not specified: Secondary | ICD-10-CM | POA: Diagnosis not present

## 2017-05-12 DIAGNOSIS — R319 Hematuria, unspecified: Secondary | ICD-10-CM | POA: Diagnosis not present

## 2017-05-24 DIAGNOSIS — Z7901 Long term (current) use of anticoagulants: Secondary | ICD-10-CM | POA: Diagnosis not present

## 2017-05-24 DIAGNOSIS — R300 Dysuria: Secondary | ICD-10-CM | POA: Diagnosis not present

## 2017-05-29 DIAGNOSIS — G479 Sleep disorder, unspecified: Secondary | ICD-10-CM | POA: Diagnosis not present

## 2017-05-29 DIAGNOSIS — Z0000 Encounter for general adult medical examination without abnormal findings: Secondary | ICD-10-CM | POA: Diagnosis not present

## 2017-05-29 DIAGNOSIS — Z7901 Long term (current) use of anticoagulants: Secondary | ICD-10-CM | POA: Diagnosis not present

## 2017-05-29 DIAGNOSIS — I482 Chronic atrial fibrillation: Secondary | ICD-10-CM | POA: Diagnosis not present

## 2017-06-02 DIAGNOSIS — M25551 Pain in right hip: Secondary | ICD-10-CM | POA: Diagnosis not present

## 2017-06-02 DIAGNOSIS — M25511 Pain in right shoulder: Secondary | ICD-10-CM | POA: Diagnosis not present

## 2017-06-20 DIAGNOSIS — M25551 Pain in right hip: Secondary | ICD-10-CM | POA: Diagnosis not present

## 2017-06-20 DIAGNOSIS — M79601 Pain in right arm: Secondary | ICD-10-CM | POA: Diagnosis not present

## 2017-06-21 DIAGNOSIS — Z7901 Long term (current) use of anticoagulants: Secondary | ICD-10-CM | POA: Diagnosis not present

## 2017-06-21 DIAGNOSIS — R58 Hemorrhage, not elsewhere classified: Secondary | ICD-10-CM | POA: Diagnosis not present

## 2017-06-21 DIAGNOSIS — M2550 Pain in unspecified joint: Secondary | ICD-10-CM | POA: Diagnosis not present

## 2017-06-22 ENCOUNTER — Other Ambulatory Visit: Payer: Self-pay | Admitting: Physical Medicine and Rehabilitation

## 2017-06-22 DIAGNOSIS — M25551 Pain in right hip: Secondary | ICD-10-CM

## 2017-07-03 DIAGNOSIS — Z7901 Long term (current) use of anticoagulants: Secondary | ICD-10-CM | POA: Diagnosis not present

## 2017-07-04 ENCOUNTER — Ambulatory Visit
Admission: RE | Admit: 2017-07-04 | Discharge: 2017-07-04 | Disposition: A | Discharge status: Home / Self Care | Payer: Medicare Other | Source: Ambulatory Visit | Attending: Physical Medicine and Rehabilitation | Admitting: Physical Medicine and Rehabilitation

## 2017-07-04 DIAGNOSIS — M25551 Pain in right hip: Secondary | ICD-10-CM

## 2017-07-04 DIAGNOSIS — M67912 Unspecified disorder of synovium and tendon, left shoulder: Secondary | ICD-10-CM | POA: Diagnosis not present

## 2017-07-04 DIAGNOSIS — M25512 Pain in left shoulder: Secondary | ICD-10-CM | POA: Diagnosis not present

## 2017-07-04 MED ORDER — METHYLPREDNISOLONE ACETATE 40 MG/ML INJ SUSP (RADIOLOG: 120.0000 mg | Freq: Once | INTRAMUSCULAR | Status: DC

## 2017-07-04 MED ORDER — IOPAMIDOL (ISOVUE-M 200) INJECTION 41%: 1.0000 mL | Freq: Once | INTRAMUSCULAR | Status: DC

## 2017-07-04 NOTE — Discharge Instructions (Signed)

## 2017-07-06 DIAGNOSIS — Z933 Colostomy status: Secondary | ICD-10-CM | POA: Diagnosis not present

## 2017-07-06 DIAGNOSIS — K631 Perforation of intestine (nontraumatic): Secondary | ICD-10-CM | POA: Diagnosis not present

## 2017-07-10 DIAGNOSIS — Z7901 Long term (current) use of anticoagulants: Secondary | ICD-10-CM | POA: Diagnosis not present

## 2017-07-10 DIAGNOSIS — I482 Chronic atrial fibrillation: Secondary | ICD-10-CM | POA: Diagnosis not present

## 2017-07-12 DIAGNOSIS — H04123 Dry eye syndrome of bilateral lacrimal glands: Secondary | ICD-10-CM | POA: Diagnosis not present

## 2017-07-12 DIAGNOSIS — Z961 Presence of intraocular lens: Secondary | ICD-10-CM | POA: Diagnosis not present

## 2017-07-12 DIAGNOSIS — H353131 Nonexudative age-related macular degeneration, bilateral, early dry stage: Secondary | ICD-10-CM | POA: Diagnosis not present

## 2017-08-28 DIAGNOSIS — I482 Chronic atrial fibrillation: Secondary | ICD-10-CM | POA: Diagnosis not present

## 2017-08-28 DIAGNOSIS — Z7901 Long term (current) use of anticoagulants: Secondary | ICD-10-CM | POA: Diagnosis not present

## 2017-09-27 DIAGNOSIS — R399 Unspecified symptoms and signs involving the genitourinary system: Secondary | ICD-10-CM | POA: Diagnosis not present

## 2017-09-27 DIAGNOSIS — Z7901 Long term (current) use of anticoagulants: Secondary | ICD-10-CM | POA: Diagnosis not present

## 2017-10-11 DIAGNOSIS — Z7901 Long term (current) use of anticoagulants: Secondary | ICD-10-CM | POA: Diagnosis not present

## 2017-10-12 DIAGNOSIS — M25512 Pain in left shoulder: Secondary | ICD-10-CM | POA: Diagnosis not present

## 2017-10-12 DIAGNOSIS — M25551 Pain in right hip: Secondary | ICD-10-CM | POA: Diagnosis not present

## 2017-10-26 DIAGNOSIS — Z7901 Long term (current) use of anticoagulants: Secondary | ICD-10-CM | POA: Diagnosis not present

## 2017-10-26 DIAGNOSIS — Z23 Encounter for immunization: Secondary | ICD-10-CM | POA: Diagnosis not present

## 2017-12-20 DIAGNOSIS — K631 Perforation of intestine (nontraumatic): Secondary | ICD-10-CM | POA: Diagnosis not present

## 2017-12-20 DIAGNOSIS — Z933 Colostomy status: Secondary | ICD-10-CM | POA: Diagnosis not present

## 2018-01-11 DIAGNOSIS — Z7901 Long term (current) use of anticoagulants: Secondary | ICD-10-CM | POA: Diagnosis not present

## 2018-01-11 LAB — PROTIME-INR: INR: 2.8 — AB (ref 0.9–1.1)

## 2018-01-17 DIAGNOSIS — R399 Unspecified symptoms and signs involving the genitourinary system: Secondary | ICD-10-CM | POA: Diagnosis not present

## 2018-01-17 DIAGNOSIS — R3911 Hesitancy of micturition: Secondary | ICD-10-CM | POA: Diagnosis not present

## 2018-01-29 DIAGNOSIS — N390 Urinary tract infection, site not specified: Secondary | ICD-10-CM | POA: Diagnosis not present

## 2018-01-29 DIAGNOSIS — Z7901 Long term (current) use of anticoagulants: Secondary | ICD-10-CM | POA: Diagnosis not present

## 2018-01-29 LAB — PROTIME-INR: INR: 1.8 — AB (ref 0.9–1.1)

## 2018-02-27 DIAGNOSIS — Z7901 Long term (current) use of anticoagulants: Secondary | ICD-10-CM | POA: Diagnosis not present

## 2018-02-27 DIAGNOSIS — I10 Essential (primary) hypertension: Secondary | ICD-10-CM | POA: Diagnosis not present

## 2018-02-27 DIAGNOSIS — I482 Chronic atrial fibrillation, unspecified: Secondary | ICD-10-CM | POA: Diagnosis not present

## 2018-02-27 DIAGNOSIS — E039 Hypothyroidism, unspecified: Secondary | ICD-10-CM | POA: Diagnosis not present

## 2018-02-27 LAB — PROTIME-INR: INR: 1.6 — AB (ref 0.9–1.1)

## 2018-03-13 DIAGNOSIS — I482 Chronic atrial fibrillation, unspecified: Secondary | ICD-10-CM | POA: Diagnosis not present

## 2018-03-13 DIAGNOSIS — Z7901 Long term (current) use of anticoagulants: Secondary | ICD-10-CM | POA: Diagnosis not present

## 2018-04-07 DIAGNOSIS — Z79811 Long term (current) use of aromatase inhibitors: Secondary | ICD-10-CM | POA: Diagnosis not present

## 2018-04-07 DIAGNOSIS — I4891 Unspecified atrial fibrillation: Secondary | ICD-10-CM | POA: Diagnosis not present

## 2018-04-19 DIAGNOSIS — Z933 Colostomy status: Secondary | ICD-10-CM | POA: Diagnosis not present

## 2018-04-19 DIAGNOSIS — K631 Perforation of intestine (nontraumatic): Secondary | ICD-10-CM | POA: Diagnosis not present

## 2018-04-24 DIAGNOSIS — D649 Anemia, unspecified: Secondary | ICD-10-CM | POA: Diagnosis not present

## 2018-04-24 DIAGNOSIS — I4891 Unspecified atrial fibrillation: Secondary | ICD-10-CM | POA: Diagnosis not present

## 2018-05-08 DIAGNOSIS — D649 Anemia, unspecified: Secondary | ICD-10-CM | POA: Diagnosis not present

## 2018-05-08 DIAGNOSIS — I4891 Unspecified atrial fibrillation: Secondary | ICD-10-CM | POA: Diagnosis not present

## 2018-05-22 DIAGNOSIS — Z79899 Other long term (current) drug therapy: Secondary | ICD-10-CM | POA: Diagnosis not present

## 2018-05-22 DIAGNOSIS — I4891 Unspecified atrial fibrillation: Secondary | ICD-10-CM | POA: Diagnosis not present

## 2018-07-03 DIAGNOSIS — Z933 Colostomy status: Secondary | ICD-10-CM | POA: Diagnosis not present

## 2018-07-03 DIAGNOSIS — Z0000 Encounter for general adult medical examination without abnormal findings: Secondary | ICD-10-CM | POA: Diagnosis not present

## 2018-07-03 DIAGNOSIS — Z1389 Encounter for screening for other disorder: Secondary | ICD-10-CM | POA: Diagnosis not present

## 2018-07-03 DIAGNOSIS — Z7901 Long term (current) use of anticoagulants: Secondary | ICD-10-CM | POA: Diagnosis not present

## 2018-07-03 LAB — PROTIME-INR: INR: 3.2 — AB (ref 0.9–1.1)

## 2018-07-05 DIAGNOSIS — Z79899 Other long term (current) drug therapy: Secondary | ICD-10-CM | POA: Diagnosis not present

## 2018-07-05 DIAGNOSIS — I4891 Unspecified atrial fibrillation: Secondary | ICD-10-CM | POA: Diagnosis not present

## 2018-08-07 IMAGING — DX DG CHEST 2V
2 series · 2 of 2 positions shown · non-contrast
Comparison: 12/31/2015

CLINICAL DATA: Wheezing.

EXAM:
CHEST  2 VIEW

[chest lat]
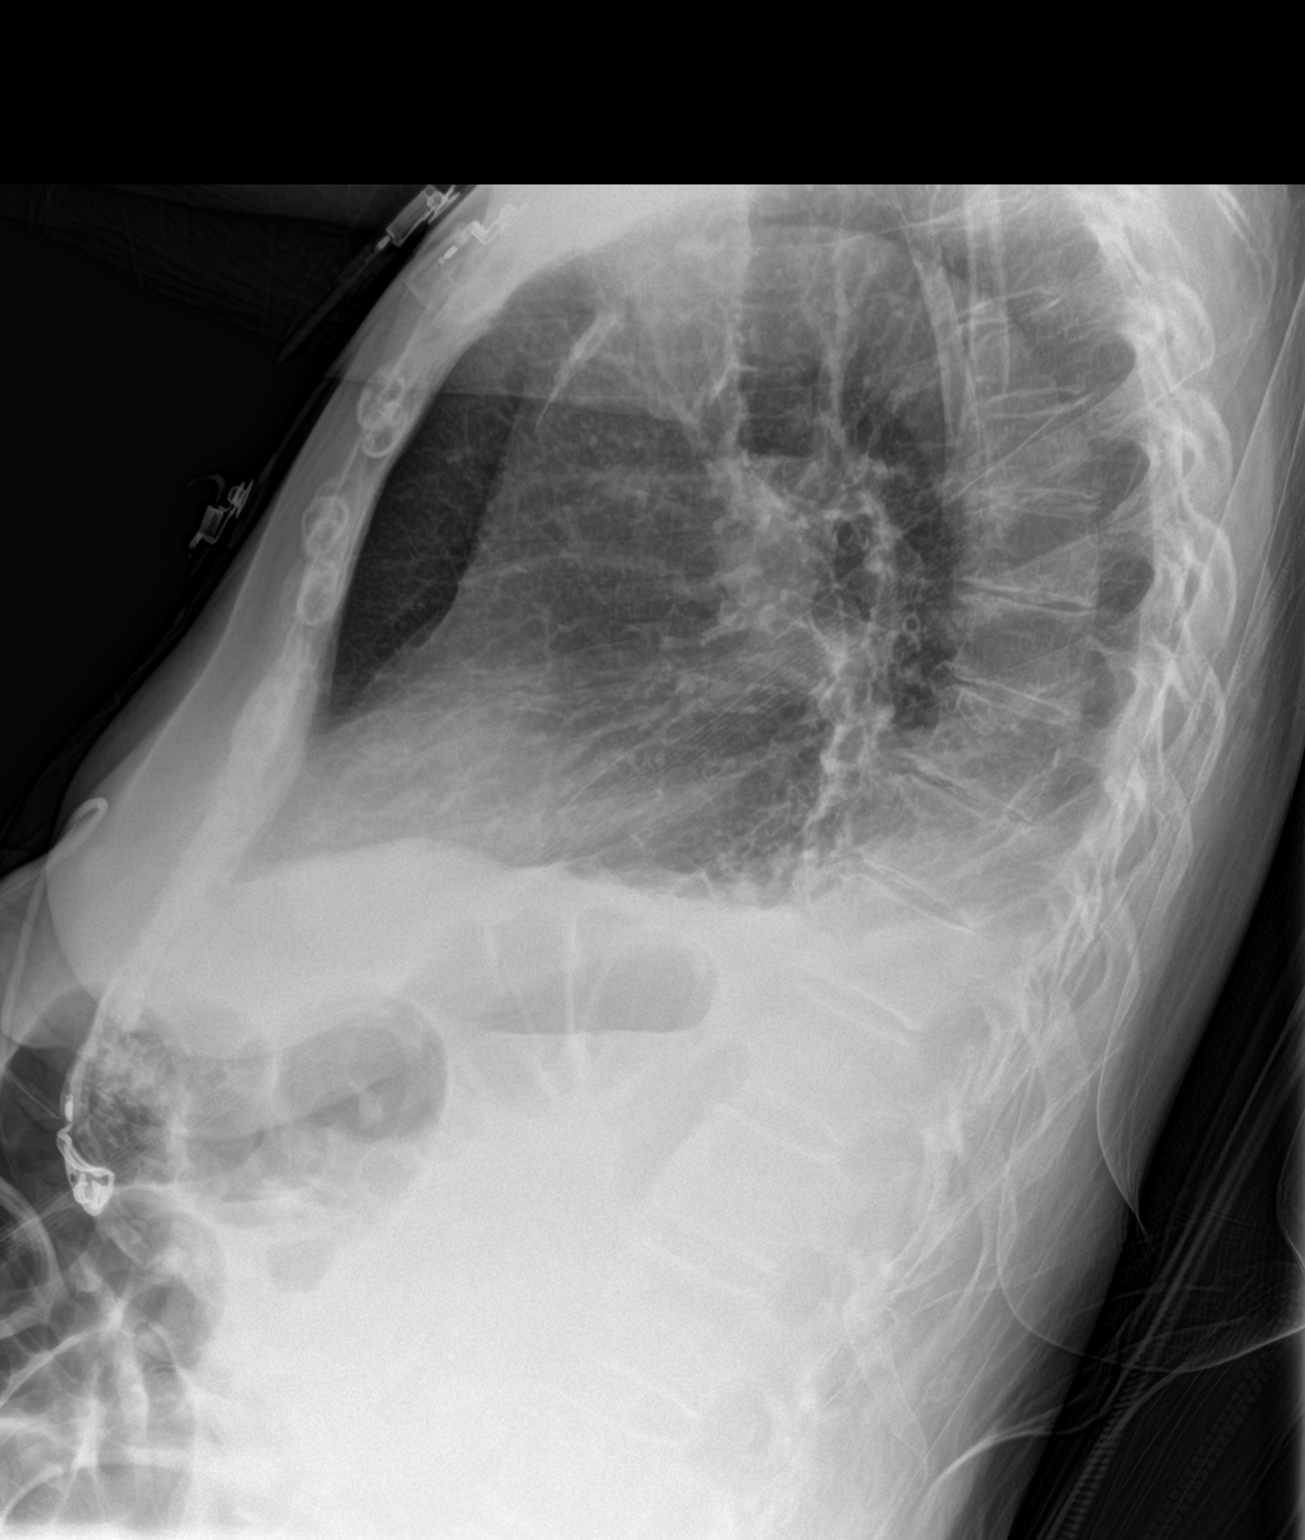

[chest ap]
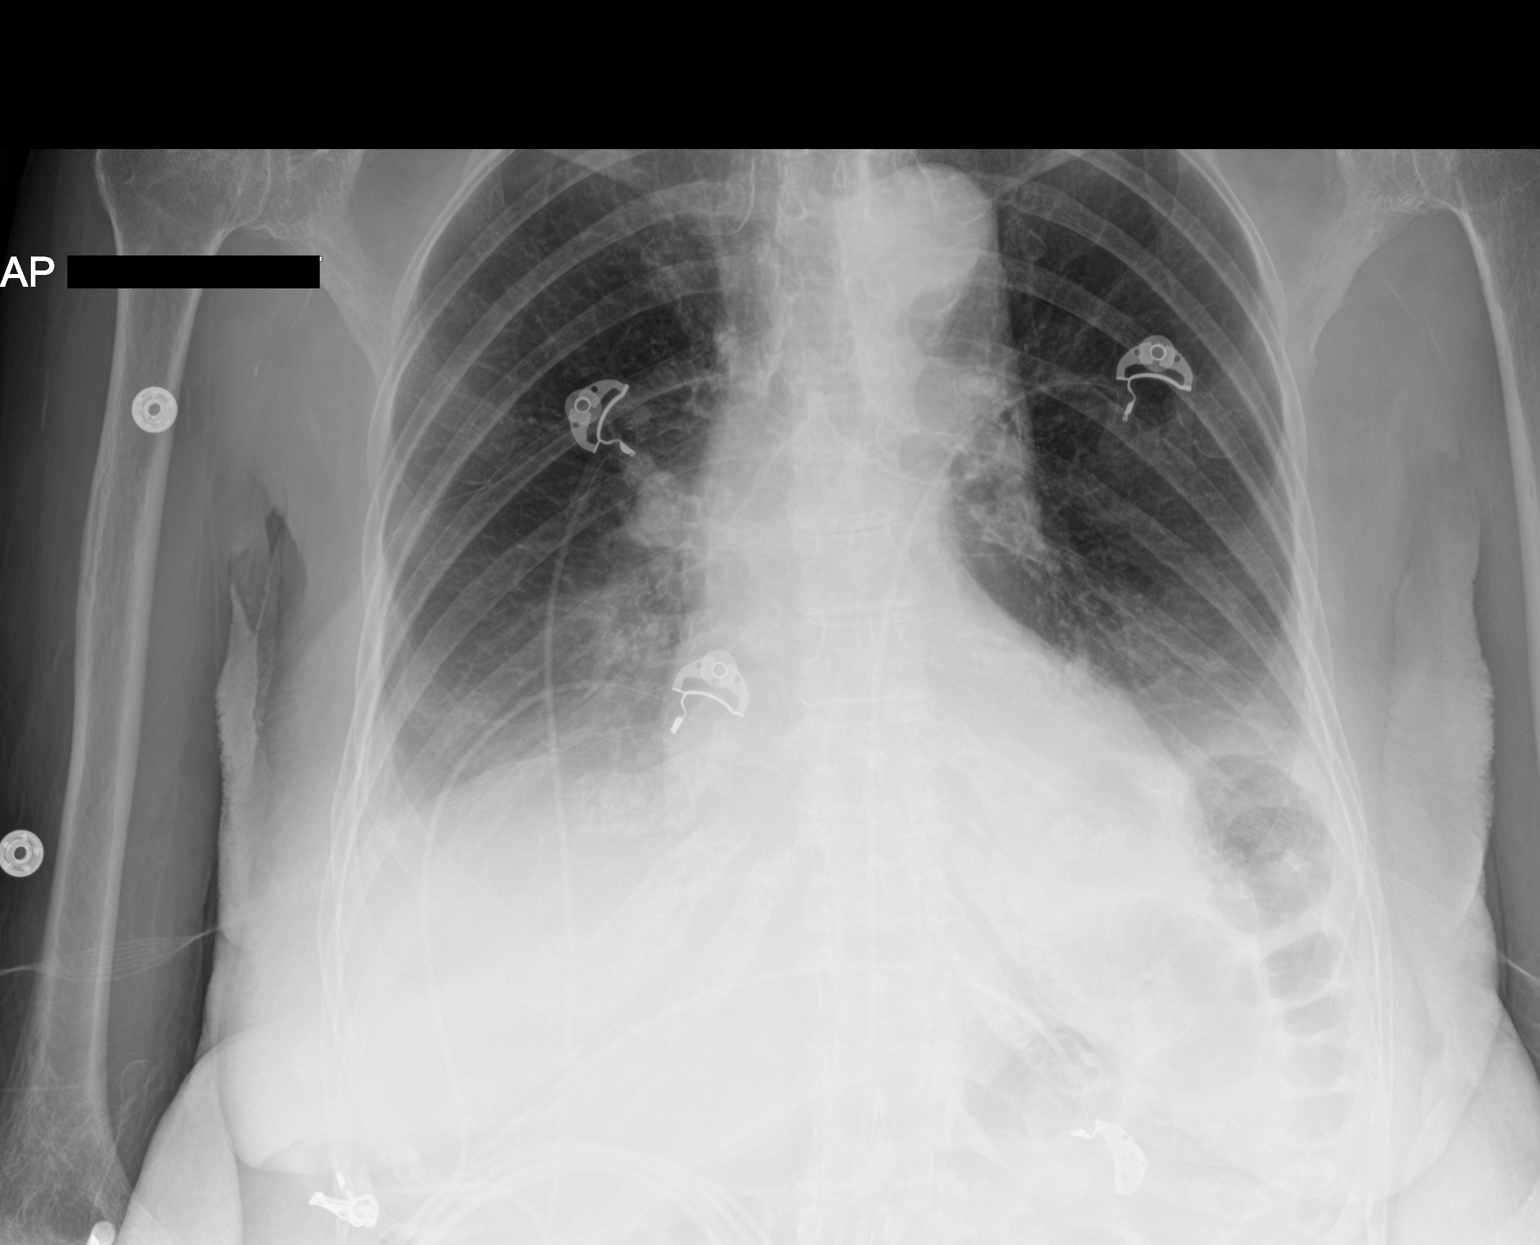

[2 of 2 positions shown; findings below may reference images not displayed]

FINDINGS: Examination demonstrates adequate lung volumes with persistent hazy
bibasilar opacification likely small bilateral pleural effusions
without significant change. This likely associated bibasilar
atelectasis. Cardiomediastinal silhouette is within normal. There is
calcified plaque over the thoracic aorta. There are degenerative
changes of the spine.
IMPRESSION: Persistent bibasilar opacification compatible with small effusions
and associated atelectasis.

Aortic atherosclerosis.

## 2018-09-03 DIAGNOSIS — R350 Frequency of micturition: Secondary | ICD-10-CM | POA: Diagnosis not present

## 2018-09-03 DIAGNOSIS — I482 Chronic atrial fibrillation, unspecified: Secondary | ICD-10-CM | POA: Diagnosis not present

## 2018-09-03 DIAGNOSIS — R1013 Epigastric pain: Secondary | ICD-10-CM | POA: Diagnosis not present

## 2018-09-03 DIAGNOSIS — Z23 Encounter for immunization: Secondary | ICD-10-CM | POA: Diagnosis not present

## 2018-09-03 LAB — PROTIME-INR: INR: 4.5 — AB (ref 0.9–1.1)

## 2018-09-14 ENCOUNTER — Telehealth: Payer: Self-pay | Admitting: Internal Medicine

## 2018-09-14 NOTE — Telephone Encounter (Signed)
New message   Patient's niece states that a referral should be in the system to Dr. Meda Coffee per her conversation with Dr. Harrington Challenger. Please advise.

## 2018-09-14 NOTE — Telephone Encounter (Signed)
Rerouted to [facility]

## 2018-09-14 NOTE — Telephone Encounter (Signed)
PT'S DAUGHTER CALLED WANTING APPT WITH DR NELSON PER DAUGHTER DR CHARLES ROSS OFFICE SENT REFERRAL YESTERDAY APPT MADE FOR 09/27/18 AT 10:40 AM .Adonis Housekeeper

## 2018-09-18 DIAGNOSIS — Z79899 Other long term (current) drug therapy: Secondary | ICD-10-CM | POA: Diagnosis not present

## 2018-09-18 DIAGNOSIS — I4891 Unspecified atrial fibrillation: Secondary | ICD-10-CM | POA: Diagnosis not present

## 2018-09-27 ENCOUNTER — Ambulatory Visit: Payer: Medicare Other | Admitting: Cardiology

## 2018-10-18 DIAGNOSIS — Z79899 Other long term (current) drug therapy: Secondary | ICD-10-CM | POA: Diagnosis not present

## 2018-10-18 DIAGNOSIS — I4891 Unspecified atrial fibrillation: Secondary | ICD-10-CM | POA: Diagnosis not present

## 2018-11-02 ENCOUNTER — Other Ambulatory Visit: Payer: Self-pay

## 2018-11-02 ENCOUNTER — Ambulatory Visit: Payer: Medicare Other | Admitting: Cardiology

## 2018-11-02 ENCOUNTER — Encounter: Payer: Self-pay | Admitting: Cardiology

## 2018-11-02 VITALS — BP 122/80 | HR 73 | Ht 61.5 in | Wt 123.0 lb

## 2018-11-02 DIAGNOSIS — I10 Essential (primary) hypertension: Secondary | ICD-10-CM

## 2018-11-02 DIAGNOSIS — R0789 Other chest pain: Secondary | ICD-10-CM

## 2018-11-02 DIAGNOSIS — I471 Supraventricular tachycardia: Secondary | ICD-10-CM | POA: Diagnosis not present

## 2018-11-02 DIAGNOSIS — R42 Dizziness and giddiness: Secondary | ICD-10-CM | POA: Diagnosis not present

## 2018-11-02 DIAGNOSIS — I480 Paroxysmal atrial fibrillation: Secondary | ICD-10-CM

## 2018-11-02 NOTE — Patient Instructions (Signed)
Medication Instructions:  The current medical regimen is effective;  continue present plan and medications.  Follow-Up: Follow up as needed with Dr Skains.  Thank you for choosing [facility]!!     

## 2018-11-02 NOTE — Progress Notes (Signed)
Cardiology Office Note:    Date:  11/02/2018   ID:  Candace Cain, DOB February 27, 1920, MRN [id number]  PCP:  Daisy Floro, MD  Cardiologist:  No primary care provider on file.  Electrophysiologist:  None   Referring MD: Daisy Floro, MD     History of Present Illness:    Candace Cain is a 85 y.o. female here for the evaluation of chest pain at the request of Dr. Tenny Craw.  Has paroxysmal atrial fibrillation, occasional lower extremity edema.  Had a possible anginal episode EKG was performed and was abnormal she has had epigastric discomfort as well takes Coumadin  Recently has been in sinus rhythm. PAT  Lost balance at night, can loose balance and fall. Normally can grab hold but could not  She also had an episode of left-sided chest pain that first lasted 10 minutes then went away and then had another episode that lasted 5 minutes after she took 1 nitroglycerin under her tongue.  She has not taken any further nitroglycerin.  She currently is doing well.  No complaints.  She grew up in [place] came over in her 74s to the [place].  She lived in [place] for quite some time then [facility] in [place] and now [place].  Past Medical History:  Diagnosis Date  . Alopecia   . Atrial fibrillation (HCC)   . Bradycardia   . BRADYCARDIA 04/19/2008   Qualifier: Diagnosis of  By: Bascom Levels, RMA, Sherri    . CHEST PAIN 04/22/2008   Qualifier: Diagnosis of  By: Graciela Husbands, MD, Susie Cassette   . Closed right hip fracture (HCC) 04/26/2014  . Community acquired pneumonia 03/28/2014  . Corn or callus   . Dehydration, moderate 03/28/2014  . Depression 03/28/2014  . Diverticulitis of colon with perforation s/p colectomy/colostomy 12/25/2015 12/24/2015  . Diverticulitis of large intestine with abscess without bleeding   . Epigastric pain   . Essential hypertension 04/19/2008   Qualifier: Diagnosis of  By: Bascom Levels, RMA, Sherri    . GERD (gastroesophageal reflux disease) 03/28/2014  .  Hemoptysis   . Hip fracture requiring operative repair (HCC) 04/25/2014  . Hypokalemia   . Hyponatremia 03/27/2014  . Hypothyroidism 03/28/2014  . Perforated bowel (HCC) 12/25/2015  . Protein-calorie malnutrition, severe 12/25/2015  . Small bowel obstruction (HCC) 12/24/2015  . Unspecified essential hypertension   . Urinary frequency     Past Surgical History:  Procedure Laterality Date  . HIP ARTHROPLASTY Right 04/27/2014   Procedure: ARTHROPLASTY CEMENTED HEMI HIP;  Surgeon: Marcene Corning, MD;  Location: [facility];  Service: Orthopedics;  Laterality: Right;  . hysterectomy, unspecifed area    . LAPAROTOMY N/A 12/25/2015   Procedure: EXPLORATORY LAPAROTOMY, SIGMOID COLECTOMY, COLOSTOMY;  Surgeon: Harriette Bouillon, MD;  Location: [facility];  Service: General;  Laterality: N/A;  . neuropathy      Current Medications: Current Meds  Medication Sig  . benazepril (LOTENSIN) 40 MG tablet Take 40 mg by mouth daily.  . Calcium Carbonate-Vitamin D (TH CALCIUM CARBONATE-VITAMIN D) 600-400 MG-UNIT per tablet Take 2 tablets by mouth daily.    . hydrochlorothiazide (HYDRODIURIL) 25 MG tablet Take 25 mg by mouth daily.  Marland Kitchen levothyroxine (SYNTHROID, LEVOTHROID) 25 MCG tablet Take 25 mcg by mouth daily before breakfast.  . nitroGLYCERIN (NITROSTAT) 0.4 MG SL tablet Place 0.4 mg under the tongue every 5 (five) minutes as needed for chest pain.  Marland Kitchen warfarin (COUMADIN) 2.5 MG tablet Take 2.5 mg by mouth every evening.  Allergies:   Patient has no known allergies.   Social History   Socioeconomic History  . Marital status: Married    Spouse name: Not on file  . Number of children: Not on file  . Years of education: Not on file  . Highest education level: Not on file  Occupational History  . Not on file  Social Needs  . Financial resource strain: Not on file  . Food insecurity    Worry: Not on file    Inability: Not on file  . Transportation needs    Medical: Not on file    Non-medical: Not on  file  Tobacco Use  . Smoking status: Former Games developermoker  . Smokeless tobacco: Never Used  Substance and Sexual Activity  . Alcohol use: Yes    Comment: occasional wine   . Drug use: No  . Sexual activity: Not on file  Lifestyle  . Physical activity    Days per week: Not on file    Minutes per session: Not on file  . Stress: Not on file  Relationships  . Social Musicianconnections    Talks on phone: Not on file    Gets together: Not on file    Attends religious service: Not on file    Active member of club or organization: Not on file    Attends meetings of clubs or organizations: Not on file    Relationship status: Not on file  Other Topics Concern  . Not on file  Social History Narrative   Married and retired.      Family History: The patient's family history includes Cancer in her maternal grandfather and another family member; Colon cancer in her mother; Coronary artery disease in an other family member; Heart disease in her brother, father, and sister; Prostate cancer in her brother.  ROS:   Please see the history of present illness.    Denies any fevers chills nausea vomiting syncope all other systems reviewed and are negative.  EKGs/Labs/Other Studies Reviewed:    The following studies were reviewed today: Prior EKG reviewed  EKG: 11/02/2018-sinus rhythm first-degree AV block, occasional paroxysmal atrial tachycardia noted.  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  Recent Labs: No results found for requested labs within last 8760 hours.  Recent Lipid Panel    Component Value Date/Time   CHOL 179 06/05/2010 0420   TRIG 82 06/05/2010 0420   HDL 88 06/05/2010 0420   CHOLHDL 2.0 06/05/2010 0420   VLDL 16 06/05/2010 0420   LDLCALC  06/05/2010 0420    75        Total Cholesterol/HDL:CHD Risk Coronary Heart Disease Risk Table                     Men   Women  1/2 Average Risk   3.4   3.3  Average Risk       5.0   4.4  2 X Average Risk   9.6   7.1  3 X Average  Risk  23.4   11.0        Use the calculated Patient Ratio above and the CHD Risk Table to determine the patient's CHD Risk.        ATP III CLASSIFICATION (LDL):  <100     mg/dL   Optimal  [id number]  mg/dL   Near or Above                    Optimal  130-159  mg/dL   Borderline  160-189  mg/dL   High  >190     mg/dL   Very High    Physical Exam:    VS:  BP 122/80   Pulse 73   Ht 5' 1.5" (1.562 m)   Wt 123 lb (55.8 kg)   BMI 22.86 kg/m     Wt Readings from Last 3 Encounters:  11/02/18 123 lb (55.8 kg)  01/03/16 148 lb 5.9 oz (67.3 kg)  10/20/15 129 lb 6.4 oz (58.7 kg)     GEN:  Well nourished, well developed in no acute distress HEENT: Normal NECK: No JVD; No carotid bruits LYMPHATICS: No lymphadenopathy CARDIAC: RRR, no murmurs, rubs, gallops occasional ectopy RESPIRATORY:  Clear to auscultation without rales, wheezing or rhonchi  ABDOMEN: Soft, non-tender, non-distended MUSCULOSKELETAL:  No edema; No deformity  SKIN: Warm and dry NEUROLOGIC:  Alert and oriented x 3 PSYCHIATRIC:  Normal affect   ASSESSMENT:    1. Essential hypertension   2. Paroxysmal atrial tachycardia (HCC)   3. Paroxysmal atrial fibrillation ([place])   4. Dizziness   5. Atypical chest pain    PLAN:    In order of problems listed above:  Chest pain/epigastric pain/ left sided -Most likely GI type etiology.  Hold off on any further cardiac testing. Lasted about 10 minutes first time, did decrease with NTG once 5 minute.  .  Overall doing quite well.  Paroxysmal atrial fibrillation/paroxysmal atrial tachycardia -Maintaining sinus rhythm.  Occasional PACs seen on prior EKG. paroxysmal atrial tachycardia noted on current EKG.  No bradycardia noted.  Chronic anticoagulation -On Coumadin.  Dizziness -Seems to be disequilibrium.  She is being careful with her walker.  Obviously if she loses consciousness she will let us know.  We will have low threshold for monitoring.  At this point, they  preferred not to have any further testing.  This is fine.   Medication Adjustments/Labs and Tests Ordered: Current medicines are reviewed at length with the patient today.  Concerns regarding medicines are outlined above.  Orders Placed This Encounter  Procedures  . EKG 12-Lead   No orders of the defined types were placed in this encounter.   Patient Instructions  Medication Instructions:  The current medical regimen is effective;  continue present plan and medications.  Follow-Up: Follow up as needed with Dr Marlou Porch.  Thank you for choosing [facility]!!        Signed, Candee Furbish, MD  11/02/2018 2:35 PM    [place]

## 2018-11-30 DIAGNOSIS — R3915 Urgency of urination: Secondary | ICD-10-CM | POA: Diagnosis not present

## 2018-11-30 DIAGNOSIS — R350 Frequency of micturition: Secondary | ICD-10-CM | POA: Diagnosis not present

## 2018-11-30 DIAGNOSIS — R351 Nocturia: Secondary | ICD-10-CM | POA: Diagnosis not present

## 2019-01-17 DIAGNOSIS — I4891 Unspecified atrial fibrillation: Secondary | ICD-10-CM | POA: Diagnosis not present

## 2019-01-17 DIAGNOSIS — Z79899 Other long term (current) drug therapy: Secondary | ICD-10-CM | POA: Diagnosis not present

## 2019-02-19 DIAGNOSIS — Z79899 Other long term (current) drug therapy: Secondary | ICD-10-CM | POA: Diagnosis not present

## 2019-02-19 DIAGNOSIS — I4891 Unspecified atrial fibrillation: Secondary | ICD-10-CM | POA: Diagnosis not present

## 2019-03-19 DIAGNOSIS — I4891 Unspecified atrial fibrillation: Secondary | ICD-10-CM | POA: Diagnosis not present

## 2019-03-19 DIAGNOSIS — Z79899 Other long term (current) drug therapy: Secondary | ICD-10-CM | POA: Diagnosis not present

## 2019-04-05 DIAGNOSIS — E039 Hypothyroidism, unspecified: Secondary | ICD-10-CM | POA: Diagnosis not present

## 2019-04-05 DIAGNOSIS — G2581 Restless legs syndrome: Secondary | ICD-10-CM | POA: Diagnosis not present

## 2019-04-05 DIAGNOSIS — R609 Edema, unspecified: Secondary | ICD-10-CM | POA: Diagnosis not present

## 2019-04-05 DIAGNOSIS — L659 Nonscarring hair loss, unspecified: Secondary | ICD-10-CM | POA: Diagnosis not present

## 2019-04-16 DIAGNOSIS — Z79899 Other long term (current) drug therapy: Secondary | ICD-10-CM | POA: Diagnosis not present

## 2019-04-16 DIAGNOSIS — I4891 Unspecified atrial fibrillation: Secondary | ICD-10-CM | POA: Diagnosis not present

## 2019-05-21 DIAGNOSIS — Z7901 Long term (current) use of anticoagulants: Secondary | ICD-10-CM | POA: Diagnosis not present

## 2019-05-21 DIAGNOSIS — I482 Chronic atrial fibrillation, unspecified: Secondary | ICD-10-CM | POA: Diagnosis not present

## 2019-05-27 DIAGNOSIS — K631 Perforation of intestine (nontraumatic): Secondary | ICD-10-CM | POA: Diagnosis not present

## 2019-05-27 DIAGNOSIS — Z933 Colostomy status: Secondary | ICD-10-CM | POA: Diagnosis not present

## 2019-06-18 DIAGNOSIS — Z7901 Long term (current) use of anticoagulants: Secondary | ICD-10-CM | POA: Diagnosis not present

## 2019-06-18 DIAGNOSIS — I482 Chronic atrial fibrillation, unspecified: Secondary | ICD-10-CM | POA: Diagnosis not present

## 2019-07-17 DIAGNOSIS — H04123 Dry eye syndrome of bilateral lacrimal glands: Secondary | ICD-10-CM | POA: Diagnosis not present

## 2019-07-17 DIAGNOSIS — Z961 Presence of intraocular lens: Secondary | ICD-10-CM | POA: Diagnosis not present

## 2019-07-17 DIAGNOSIS — H353131 Nonexudative age-related macular degeneration, bilateral, early dry stage: Secondary | ICD-10-CM | POA: Diagnosis not present

## 2019-07-18 DIAGNOSIS — Z0000 Encounter for general adult medical examination without abnormal findings: Secondary | ICD-10-CM | POA: Diagnosis not present

## 2019-07-18 DIAGNOSIS — I10 Essential (primary) hypertension: Secondary | ICD-10-CM | POA: Diagnosis not present

## 2019-07-18 DIAGNOSIS — G479 Sleep disorder, unspecified: Secondary | ICD-10-CM | POA: Diagnosis not present

## 2019-07-18 DIAGNOSIS — I482 Chronic atrial fibrillation, unspecified: Secondary | ICD-10-CM | POA: Diagnosis not present

## 2019-09-18 DIAGNOSIS — R609 Edema, unspecified: Secondary | ICD-10-CM | POA: Diagnosis not present

## 2019-09-18 DIAGNOSIS — I482 Chronic atrial fibrillation, unspecified: Secondary | ICD-10-CM | POA: Diagnosis not present

## 2019-09-18 DIAGNOSIS — Z7901 Long term (current) use of anticoagulants: Secondary | ICD-10-CM | POA: Diagnosis not present

## 2019-09-18 DIAGNOSIS — E039 Hypothyroidism, unspecified: Secondary | ICD-10-CM | POA: Diagnosis not present

## 2019-10-17 DIAGNOSIS — Z7901 Long term (current) use of anticoagulants: Secondary | ICD-10-CM | POA: Diagnosis not present

## 2019-12-16 DIAGNOSIS — I482 Chronic atrial fibrillation, unspecified: Secondary | ICD-10-CM | POA: Diagnosis not present

## 2019-12-16 DIAGNOSIS — E039 Hypothyroidism, unspecified: Secondary | ICD-10-CM | POA: Diagnosis not present

## 2019-12-16 DIAGNOSIS — Z7901 Long term (current) use of anticoagulants: Secondary | ICD-10-CM | POA: Diagnosis not present

## 2019-12-16 DIAGNOSIS — R350 Frequency of micturition: Secondary | ICD-10-CM | POA: Diagnosis not present

## 2019-12-30 DIAGNOSIS — K631 Perforation of intestine (nontraumatic): Secondary | ICD-10-CM | POA: Diagnosis not present

## 2019-12-30 DIAGNOSIS — Z933 Colostomy status: Secondary | ICD-10-CM | POA: Diagnosis not present

## 2020-02-18 DIAGNOSIS — S91312A Laceration without foreign body, left foot, initial encounter: Secondary | ICD-10-CM | POA: Diagnosis not present

## 2020-02-18 DIAGNOSIS — G629 Polyneuropathy, unspecified: Secondary | ICD-10-CM | POA: Diagnosis not present

## 2020-02-18 DIAGNOSIS — M79673 Pain in unspecified foot: Secondary | ICD-10-CM | POA: Diagnosis not present

## 2020-02-18 DIAGNOSIS — I482 Chronic atrial fibrillation, unspecified: Secondary | ICD-10-CM | POA: Diagnosis not present

## 2020-03-03 DIAGNOSIS — M25551 Pain in right hip: Secondary | ICD-10-CM | POA: Diagnosis not present

## 2020-04-16 DIAGNOSIS — E039 Hypothyroidism, unspecified: Secondary | ICD-10-CM | POA: Diagnosis not present

## 2020-04-16 DIAGNOSIS — R609 Edema, unspecified: Secondary | ICD-10-CM | POA: Diagnosis not present

## 2020-04-16 DIAGNOSIS — Z7901 Long term (current) use of anticoagulants: Secondary | ICD-10-CM | POA: Diagnosis not present

## 2020-06-12 ENCOUNTER — Inpatient Hospital Stay (HOSPITAL_COMMUNITY)
Admission: EM | Admit: 2020-06-12 | Discharge: 2020-06-22 | DRG: 378 | Disposition: A | Discharge status: Skilled Nursing Facility | Payer: Medicare Other | Source: Skilled Nursing Facility | Attending: Internal Medicine | Admitting: Internal Medicine

## 2020-06-12 ENCOUNTER — Encounter (HOSPITAL_COMMUNITY): Payer: Self-pay

## 2020-06-12 ENCOUNTER — Other Ambulatory Visit: Payer: Self-pay

## 2020-06-12 DIAGNOSIS — I4891 Unspecified atrial fibrillation: Secondary | ICD-10-CM | POA: Diagnosis present

## 2020-06-12 DIAGNOSIS — Z90710 Acquired absence of both cervix and uterus: Secondary | ICD-10-CM

## 2020-06-12 DIAGNOSIS — K921 Melena: Secondary | ICD-10-CM | POA: Diagnosis not present

## 2020-06-12 DIAGNOSIS — R339 Retention of urine, unspecified: Secondary | ICD-10-CM | POA: Diagnosis present

## 2020-06-12 DIAGNOSIS — Z7901 Long term (current) use of anticoagulants: Secondary | ICD-10-CM | POA: Diagnosis not present

## 2020-06-12 DIAGNOSIS — I10 Essential (primary) hypertension: Secondary | ICD-10-CM | POA: Diagnosis present

## 2020-06-12 DIAGNOSIS — D500 Iron deficiency anemia secondary to blood loss (chronic): Secondary | ICD-10-CM | POA: Diagnosis present

## 2020-06-12 DIAGNOSIS — Z9049 Acquired absence of other specified parts of digestive tract: Secondary | ICD-10-CM | POA: Diagnosis not present

## 2020-06-12 DIAGNOSIS — M19072 Primary osteoarthritis, left ankle and foot: Secondary | ICD-10-CM | POA: Diagnosis not present

## 2020-06-12 DIAGNOSIS — D6832 Hemorrhagic disorder due to extrinsic circulating anticoagulants: Secondary | ICD-10-CM | POA: Diagnosis not present

## 2020-06-12 DIAGNOSIS — Z933 Colostomy status: Secondary | ICD-10-CM | POA: Diagnosis not present

## 2020-06-12 DIAGNOSIS — R52 Pain, unspecified: Secondary | ICD-10-CM

## 2020-06-12 DIAGNOSIS — K219 Gastro-esophageal reflux disease without esophagitis: Secondary | ICD-10-CM | POA: Diagnosis present

## 2020-06-12 DIAGNOSIS — Z20822 Contact with and (suspected) exposure to covid-19: Secondary | ICD-10-CM | POA: Diagnosis not present

## 2020-06-12 DIAGNOSIS — Z96641 Presence of right artificial hip joint: Secondary | ICD-10-CM | POA: Diagnosis present

## 2020-06-12 DIAGNOSIS — D638 Anemia in other chronic diseases classified elsewhere: Secondary | ICD-10-CM | POA: Diagnosis not present

## 2020-06-12 DIAGNOSIS — K5720 Diverticulitis of large intestine with perforation and abscess without bleeding: Secondary | ICD-10-CM | POA: Diagnosis present

## 2020-06-12 DIAGNOSIS — E039 Hypothyroidism, unspecified: Secondary | ICD-10-CM | POA: Diagnosis present

## 2020-06-12 DIAGNOSIS — M129 Arthropathy, unspecified: Secondary | ICD-10-CM | POA: Diagnosis not present

## 2020-06-12 DIAGNOSIS — Z79899 Other long term (current) drug therapy: Secondary | ICD-10-CM

## 2020-06-12 DIAGNOSIS — N139 Obstructive and reflux uropathy, unspecified: Secondary | ICD-10-CM | POA: Diagnosis not present

## 2020-06-12 DIAGNOSIS — Z66 Do not resuscitate: Secondary | ICD-10-CM | POA: Diagnosis not present

## 2020-06-12 DIAGNOSIS — Z87891 Personal history of nicotine dependence: Secondary | ICD-10-CM

## 2020-06-12 DIAGNOSIS — I480 Paroxysmal atrial fibrillation: Secondary | ICD-10-CM

## 2020-06-12 DIAGNOSIS — K6289 Other specified diseases of anus and rectum: Secondary | ICD-10-CM | POA: Diagnosis not present

## 2020-06-12 DIAGNOSIS — N3289 Other specified disorders of bladder: Secondary | ICD-10-CM | POA: Diagnosis not present

## 2020-06-12 DIAGNOSIS — R140 Abdominal distension (gaseous): Secondary | ICD-10-CM | POA: Diagnosis not present

## 2020-06-12 DIAGNOSIS — R109 Unspecified abdominal pain: Secondary | ICD-10-CM

## 2020-06-12 DIAGNOSIS — K922 Gastrointestinal hemorrhage, unspecified: Secondary | ICD-10-CM | POA: Diagnosis not present

## 2020-06-12 DIAGNOSIS — R609 Edema, unspecified: Secondary | ICD-10-CM | POA: Diagnosis not present

## 2020-06-12 DIAGNOSIS — Z800 Family history of malignant neoplasm of digestive organs: Secondary | ICD-10-CM

## 2020-06-12 DIAGNOSIS — Z8249 Family history of ischemic heart disease and other diseases of the circulatory system: Secondary | ICD-10-CM

## 2020-06-12 DIAGNOSIS — K625 Hemorrhage of anus and rectum: Secondary | ICD-10-CM | POA: Diagnosis not present

## 2020-06-12 DIAGNOSIS — E43 Unspecified severe protein-calorie malnutrition: Secondary | ICD-10-CM | POA: Diagnosis not present

## 2020-06-12 DIAGNOSIS — D62 Acute posthemorrhagic anemia: Secondary | ICD-10-CM | POA: Diagnosis not present

## 2020-06-12 LAB — CBC WITH DIFFERENTIAL/PLATELET
Abs Immature Granulocytes: 0.02 10*3/uL (ref 0.00–0.07)
Basophils Absolute: 0 10*3/uL (ref 0.0–0.1)
Basophils Relative: 0 %
Eosinophils Absolute: 0.1 10*3/uL (ref 0.0–0.5)
Eosinophils Relative: 1 %
HCT: 36.9 % (ref 36.0–46.0)
Hemoglobin: 11.6 g/dL — ABNORMAL LOW (ref 12.0–15.0)
Immature Granulocytes: 0 %
Lymphocytes Relative: 14 %
Lymphs Abs: 0.9 10*3/uL (ref 0.7–4.0)
MCH: 29.3 pg (ref 26.0–34.0)
MCHC: 31.4 g/dL (ref 30.0–36.0)
MCV: 93.2 fL (ref 80.0–100.0)
Monocytes Absolute: 0.4 10*3/uL (ref 0.1–1.0)
Monocytes Relative: 6 %
Neutro Abs: 4.8 10*3/uL (ref 1.7–7.7)
Neutrophils Relative %: 79 %
Platelets: 247 10*3/uL (ref 150–400)
RBC: 3.96 MIL/uL (ref 3.87–5.11)
RDW: 14.7 % (ref 11.5–15.5)
WBC: 6.1 10*3/uL (ref 4.0–10.5)
nRBC: 0 % (ref 0.0–0.2)

## 2020-06-12 LAB — COMPREHENSIVE METABOLIC PANEL
ALT: 12 U/L (ref 0–44)
AST: 29 U/L (ref 15–41)
Albumin: 4.2 g/dL (ref 3.5–5.0)
Alkaline Phosphatase: 57 U/L (ref 38–126)
Anion gap: 8 (ref 5–15)
BUN: 29 mg/dL — ABNORMAL HIGH (ref 8–23)
CO2: 28 mmol/L (ref 22–32)
Calcium: 9.3 mg/dL (ref 8.9–10.3)
Chloride: 101 mmol/L (ref 98–111)
Creatinine, Ser: 0.82 mg/dL (ref 0.44–1.00)
GFR, Estimated: >60 mL/min (ref >60)
Glucose, Bld: 95 mg/dL (ref 70–99)
Potassium: 4.6 mmol/L (ref 3.5–5.1)
Sodium: 137 mmol/L (ref 135–145)
Total Bilirubin: 0.6 mg/dL (ref 0.3–1.2)
Total Protein: 7.1 g/dL (ref 6.5–8.1)

## 2020-06-12 LAB — TYPE AND SCREEN
ABO/RH(D): A POS
Antibody Screen: NEGATIVE

## 2020-06-12 LAB — RESP PANEL BY RT-PCR (FLU A&B, COVID) ARPGX2
Influenza A by PCR: NEGATIVE
Influenza B by PCR: NEGATIVE
SARS Coronavirus 2 by RT PCR: NEGATIVE

## 2020-06-12 LAB — PROTIME-INR
INR: 2.2 — ABNORMAL HIGH (ref 0.8–1.2)
Prothrombin Time: 24.2 seconds — ABNORMAL HIGH (ref 11.4–15.2)

## 2020-06-12 MED ORDER — SODIUM CHLORIDE 0.9 % IV BOLUS
500.0000 mL | Freq: Once | INTRAVENOUS | Status: AC
  Administered 2020-06-12: 500 mL via INTRAVENOUS

## 2020-06-12 NOTE — ED Provider Notes (Signed)
[facility] Note   CSN: [id number] Arrival date & time: 06/12/20  1952     History Chief Complaint  Patient presents with  . Rectal Bleeding    Candace Cain is a 87 y.o. female past medical history of A. fib on Coumadin, diverticulitis status post perforation with colostomy, hypertension, presenting to the ED from independent living facility for bright red blood per rectum.  Patient states she went to the restroom and saw a large amount of bright red blood.  She had some clots as well.  She thinks is from her rectum though is unsure if it is from her vagina.  She has had a hysterectomy.  She does not pass any stool through her rectum, has had colostomy for about 10 years now per patient's niece at bedside.  Patient denies any pain.  Normal output from her colostomy.  No fevers.  No blood in her urine that she knows of.  Has been taking Coumadin as directed.  No significant fatigue or weakness.  The history is provided by the patient and a relative.       Past Medical History:  Diagnosis Date  . Alopecia   . Atrial fibrillation (HCC)   . Bradycardia   . BRADYCARDIA 04/19/2008   Qualifier: Diagnosis of  By: Bascom Levels, RMA, Sherri    . CHEST PAIN 04/22/2008   Qualifier: Diagnosis of  By: Graciela Husbands, MD, Susie Cassette   . Closed right hip fracture (HCC) 04/26/2014  . Community acquired pneumonia 03/28/2014  . Corn or callus   . Dehydration, moderate 03/28/2014  . Depression 03/28/2014  . Diverticulitis of colon with perforation s/p colectomy/colostomy 12/25/2015 12/24/2015  . Diverticulitis of large intestine with abscess without bleeding   . Epigastric pain   . Essential hypertension 04/19/2008   Qualifier: Diagnosis of  By: Bascom Levels, RMA, Sherri    . GERD (gastroesophageal reflux disease) 03/28/2014  . Hemoptysis   . Hip fracture requiring operative repair (HCC) 04/25/2014  . Hypokalemia   . Hyponatremia 03/27/2014  . Hypothyroidism 03/28/2014   . Perforated bowel (HCC) 12/25/2015  . Protein-calorie malnutrition, severe 12/25/2015  . Small bowel obstruction (HCC) 12/24/2015  . Unspecified essential hypertension   . Urinary frequency     Patient Active Problem List   Diagnosis Date Noted  . Rectal bleeding 06/12/2020  . Diverticulitis of large intestine with abscess without bleeding   . Hypokalemia   . Protein-calorie malnutrition, severe 12/25/2015  . Perforated bowel (HCC) 12/25/2015  . Small bowel obstruction (HCC) 12/24/2015  . Diverticulitis of colon with perforation s/p colectomy/colostomy 12/25/2015 12/24/2015  . Closed right hip fracture (HCC) 04/26/2014  . Hip fracture requiring operative repair (HCC) 04/25/2014  . Lobar pneumonia due to unspecified organism 03/28/2014  . Dehydration, moderate 03/28/2014  . Hypothyroidism 03/28/2014  . GERD (gastroesophageal reflux disease) 03/28/2014  . Depression 03/28/2014  . Community acquired pneumonia 03/28/2014  . Hemoptysis   . Hyponatremia 03/27/2014  . Long term (current) use of anticoagulants 04/21/2011  . CHEST PAIN 04/22/2008  . Essential hypertension 04/19/2008  . Atrial fibrillation (HCC) 04/19/2008  . BRADYCARDIA 04/19/2008    Past Surgical History:  Procedure Laterality Date  . HIP ARTHROPLASTY Right 04/27/2014   Procedure: ARTHROPLASTY CEMENTED HEMI HIP;  Surgeon: Marcene Corning, MD;  Location: [facility];  Service: Orthopedics;  Laterality: Right;  . hysterectomy, unspecifed area    . LAPAROTOMY N/A 12/25/2015   Procedure: EXPLORATORY LAPAROTOMY, SIGMOID COLECTOMY, COLOSTOMY;  Surgeon: Harriette Bouillon, MD;  Location: [facility];  Service: General;  Laterality: N/A;  . neuropathy       OB History   No obstetric history on file.     Family History  Problem Relation Age of Onset  . Cancer Other        family history of it  . Coronary artery disease Other        family history of it  . Colon cancer Mother   . Heart disease Father   . Heart disease Sister    . Heart disease Brother   . Cancer Maternal Grandfather   . Prostate cancer Brother     Social History   Tobacco Use  . Smoking status: Former Games developer  . Smokeless tobacco: Never Used  Substance Use Topics  . Alcohol use: Yes    Comment: occasional wine   . Drug use: No    Home Medications Prior to Admission medications   Medication Sig Start Date End Date Taking? Authorizing Provider  benazepril (LOTENSIN) 40 MG tablet Take 40 mg by mouth daily.   Yes [provider]  cyanocobalamin 100 MCG tablet Take 100 mcg by mouth daily.   Yes [provider]  furosemide (LASIX) 20 MG tablet Take 20 mg by mouth daily. 04/17/20  Yes [provider]  nitroGLYCERIN (NITROSTAT) 0.4 MG SL tablet Place 0.4 mg under the tongue every 5 (five) minutes as needed for chest pain.   Yes [provider]  warfarin (COUMADIN) 2.5 MG tablet Take 2.5 mg by mouth every evening.   Yes [provider]  levothyroxine (SYNTHROID, LEVOTHROID) 25 MCG tablet Take 25 mcg by mouth daily before breakfast. Patient not taking: Reported on 06/12/2020    [provider]    Allergies    Amlodipine besylate, Doxycycline, Hydrocodone bit-homatrop mbr, Terbinafine hcl, and Tramadol hcl  Review of Systems   Review of Systems  Gastrointestinal: Positive for blood in stool.  Hematological: Bruises/bleeds easily.  All other systems reviewed and are negative.   Physical Exam Updated Vital Signs BP (!) 175/118   Pulse 60   Temp 98.1 F (36.7 C) (Oral)   Resp 14   Ht 5' 1.5" (1.562 m)   Wt 54.4 kg   SpO2 95%   BMI 22.31 kg/m   Physical Exam Vitals and nursing note reviewed.  Constitutional:      General: She is not in acute distress.    Appearance: She is well-developed. She is not ill-appearing.  HENT:     Head: Normocephalic and atraumatic.  Eyes:     Conjunctiva/sclera: Conjunctivae normal.  Cardiovascular:     Rate and Rhythm: Normal rate.  Pulmonary:      Effort: Pulmonary effort is normal. No respiratory distress.     Breath sounds: Normal breath sounds.  Abdominal:     General: Bowel sounds are normal.     Palpations: Abdomen is soft.     Tenderness: There is no abdominal tenderness. There is no guarding or rebound.     Comments: Left colostomy in place with soft brown stool.  No blood.  Genitourinary:    Comments: Exam of groin and rectum performed with RN chaperone present.  There is no blood coming from the vagina.  There is bright red blood in her diaper and at the rectum with multiple small nonthrombosed nontender hemorrhoids.  No lacerations. Skin:    General: Skin is warm.  Neurological:     Mental Status: She is alert.  Psychiatric:  Behavior: Behavior normal.     ED Results / Procedures / Treatments   Labs (all labs ordered are listed, but only abnormal results are displayed) Labs Reviewed  CBC WITH DIFFERENTIAL/PLATELET - Abnormal; Notable for the following components:      Result Value   Hemoglobin 11.6 (*)    All other components within normal limits  COMPREHENSIVE METABOLIC PANEL - Abnormal; Notable for the following components:   BUN 29 (*)    All other components within normal limits  PROTIME-INR - Abnormal; Notable for the following components:   Prothrombin Time 24.2 (*)    INR 2.2 (*)    All other components within normal limits  RESP PANEL BY RT-PCR (FLU A&B, COVID) ARPGX2  TYPE AND SCREEN    EKG None  Radiology No results found.  Procedures Procedures   Medications Ordered in ED Medications  sodium chloride 0.9 % bolus 500 mL (0 mLs Intravenous Stopped 06/12/20 2159)    ED Course  I have reviewed the triage vital signs and the nursing notes.  Pertinent labs & imaging results that were available during my care of the patient were reviewed by me and considered in my medical decision making (see chart for details).    MDM Rules/Calculators/A&P                          Patient is a  87 year old female coming from independent living presenting for bright red blood per rectum that began today.  Patient reports large amount of bright red blood earlier with clots.  She is left colostomy and does not pass stool through her rectum for many years.  She is on Coumadin for A. fib.  She does have bright red blood at the rectum on exam which is also in her adult diaper.  There is no blood coming from the vagina.  She also has had hysterectomy in the past.  There is no evident blood in colostomy bag, brown stool is noted.  She is not tachycardic or hypotensive, and she is in no distress.  Hemoglobin at this time is stable at 11.6, INR is 2.2.  Do not believe she needs reversal urgently though believes she needs monitoring overnight, trend hemoglobin.  Patient is in agreement with care plan.  Secure chat message to [facility] to follow in the morning.  Patient admitted to the hospitalist service. Final Clinical Impression(s) / ED Diagnoses Final diagnoses:  Rectal bleeding    Rx / DC Orders ED Discharge Orders    None       Charlynn Salih, [place] N, PA-C 06/12/20 2357    Sabino Donovan, MD 06/18/20 0700

## 2020-06-12 NOTE — ED Triage Notes (Signed)
Patient BIB [facility] from [facility]. 2 hours ago, patient noticed blood in her underwear. Patient is on Warfarin and has colostomy bag. Nurse on site did not see anything rectally or vaginally so they sent her in for further evaluation.   EMS vitals 134/90 HR 70 98% Room Air

## 2020-06-13 ENCOUNTER — Encounter (HOSPITAL_COMMUNITY): Payer: Self-pay | Admitting: Family Medicine

## 2020-06-13 DIAGNOSIS — D500 Iron deficiency anemia secondary to blood loss (chronic): Secondary | ICD-10-CM | POA: Diagnosis present

## 2020-06-13 DIAGNOSIS — E039 Hypothyroidism, unspecified: Secondary | ICD-10-CM | POA: Diagnosis present

## 2020-06-13 DIAGNOSIS — Z79899 Other long term (current) drug therapy: Secondary | ICD-10-CM | POA: Diagnosis not present

## 2020-06-13 DIAGNOSIS — Z90710 Acquired absence of both cervix and uterus: Secondary | ICD-10-CM | POA: Diagnosis not present

## 2020-06-13 DIAGNOSIS — N3289 Other specified disorders of bladder: Secondary | ICD-10-CM | POA: Diagnosis present

## 2020-06-13 DIAGNOSIS — Z87891 Personal history of nicotine dependence: Secondary | ICD-10-CM | POA: Diagnosis not present

## 2020-06-13 DIAGNOSIS — R339 Retention of urine, unspecified: Secondary | ICD-10-CM | POA: Diagnosis present

## 2020-06-13 DIAGNOSIS — Z800 Family history of malignant neoplasm of digestive organs: Secondary | ICD-10-CM | POA: Diagnosis not present

## 2020-06-13 DIAGNOSIS — K625 Hemorrhage of anus and rectum: Secondary | ICD-10-CM | POA: Diagnosis present

## 2020-06-13 DIAGNOSIS — D638 Anemia in other chronic diseases classified elsewhere: Secondary | ICD-10-CM | POA: Diagnosis present

## 2020-06-13 DIAGNOSIS — R609 Edema, unspecified: Secondary | ICD-10-CM | POA: Diagnosis not present

## 2020-06-13 DIAGNOSIS — Z9049 Acquired absence of other specified parts of digestive tract: Secondary | ICD-10-CM | POA: Diagnosis not present

## 2020-06-13 DIAGNOSIS — Z7901 Long term (current) use of anticoagulants: Secondary | ICD-10-CM | POA: Diagnosis not present

## 2020-06-13 DIAGNOSIS — Z20822 Contact with and (suspected) exposure to covid-19: Secondary | ICD-10-CM | POA: Diagnosis present

## 2020-06-13 DIAGNOSIS — I10 Essential (primary) hypertension: Secondary | ICD-10-CM | POA: Diagnosis present

## 2020-06-13 DIAGNOSIS — Z66 Do not resuscitate: Secondary | ICD-10-CM | POA: Diagnosis present

## 2020-06-13 DIAGNOSIS — Z96641 Presence of right artificial hip joint: Secondary | ICD-10-CM | POA: Diagnosis present

## 2020-06-13 DIAGNOSIS — Z8249 Family history of ischemic heart disease and other diseases of the circulatory system: Secondary | ICD-10-CM | POA: Diagnosis not present

## 2020-06-13 DIAGNOSIS — I4891 Unspecified atrial fibrillation: Secondary | ICD-10-CM | POA: Diagnosis present

## 2020-06-13 DIAGNOSIS — K219 Gastro-esophageal reflux disease without esophagitis: Secondary | ICD-10-CM | POA: Diagnosis present

## 2020-06-13 DIAGNOSIS — D6832 Hemorrhagic disorder due to extrinsic circulating anticoagulants: Secondary | ICD-10-CM | POA: Diagnosis present

## 2020-06-13 DIAGNOSIS — Z933 Colostomy status: Secondary | ICD-10-CM | POA: Diagnosis not present

## 2020-06-13 DIAGNOSIS — K921 Melena: Secondary | ICD-10-CM | POA: Diagnosis present

## 2020-06-13 LAB — BASIC METABOLIC PANEL
Anion gap: 6 (ref 5–15)
BUN: 24 mg/dL — ABNORMAL HIGH (ref 8–23)
CO2: 26 mmol/L (ref 22–32)
Calcium: 8.7 mg/dL — ABNORMAL LOW (ref 8.9–10.3)
Chloride: 107 mmol/L (ref 98–111)
Creatinine, Ser: 0.42 mg/dL — ABNORMAL LOW (ref 0.44–1.00)
GFR, Estimated: >60 mL/min (ref >60)
Glucose, Bld: 96 mg/dL (ref 70–99)
Potassium: 3.6 mmol/L (ref 3.5–5.1)
Sodium: 139 mmol/L (ref 135–145)

## 2020-06-13 LAB — HEMATOCRIT
HCT: 36 % (ref 36.0–46.0)
HCT: 34 % — ABNORMAL LOW (ref 36.0–46.0)
HCT: 30.2 % — ABNORMAL LOW (ref 36.0–46.0)

## 2020-06-13 LAB — HEMOGLOBIN
Hemoglobin: 11.5 g/dL — ABNORMAL LOW (ref 12.0–15.0)
Hemoglobin: 10.8 g/dL — ABNORMAL LOW (ref 12.0–15.0)
Hemoglobin: 9.6 g/dL — ABNORMAL LOW (ref 12.0–15.0)

## 2020-06-13 LAB — PROTIME-INR
INR: 2.4 — ABNORMAL HIGH (ref 0.8–1.2)
Prothrombin Time: 25.8 seconds — ABNORMAL HIGH (ref 11.4–15.2)

## 2020-06-13 MED ORDER — ONDANSETRON HCL 4 MG PO TABS: 4.0000 mg | ORAL_TABLET | Freq: Four times a day (QID) | ORAL | Status: DC | PRN

## 2020-06-13 MED ORDER — HYDRALAZINE HCL 25 MG PO TABS: 25.0000 mg | ORAL_TABLET | Freq: Three times a day (TID) | ORAL | Status: DC | PRN

## 2020-06-13 MED ORDER — SODIUM CHLORIDE 0.9 % IV SOLN: INTRAVENOUS | Status: AC

## 2020-06-13 MED ORDER — HYDROCORTISONE ACETATE 25 MG RE SUPP
25.0000 mg | Freq: Two times a day (BID) | RECTAL | Status: AC
  Administered 2020-06-14: 25 mg via RECTAL
  Filled 2020-06-13 (×4): qty 1

## 2020-06-13 MED ORDER — ACETAMINOPHEN 650 MG RE SUPP: 650.0000 mg | Freq: Four times a day (QID) | RECTAL | Status: DC | PRN

## 2020-06-13 MED ORDER — ONDANSETRON HCL 4 MG/2ML IJ SOLN: 4.0000 mg | Freq: Four times a day (QID) | INTRAMUSCULAR | Status: DC | PRN

## 2020-06-13 MED ORDER — ACETAMINOPHEN 325 MG PO TABS
650.0000 mg | ORAL_TABLET | Freq: Four times a day (QID) | ORAL | Status: DC | PRN
  Administered 2020-06-16 – 2020-06-22 (×4): 650 mg via ORAL
  Filled 2020-06-13 (×4): qty 2

## 2020-06-13 NOTE — H&P (Signed)
History and Physical    Candace Cain XFG:[id number] DOB: 07/06/1920 DOA: 06/12/2020  PCP: Daisy Floro, MD   Patient coming from: ILF   Chief Complaint: rectal bleeding   HPI: Candace Cain is a 87 y.o. female with medical history significant for atrial fibrillation on Coumadin,, hypothyroidism, perforated diverticulitis status post sigmoid colectomy and colostomy in 2017, now presenting to the emergency department red blood per rectum.  Patient reports that she was in her usual state and having an uneventful day until approximately 6 PM when she reports passing a large volume of bright red blood on the toilet.  She has not seen any blood in her colostomy bag, has not had any abdominal or rectal pain, denies trauma, and has never experienced this before.  Family at bedside notes that they saw some red blood clots.  ED Course: Upon arrival to the ED, patient is found to be afebrile, saturating well on room air, and with normal heart rate and blood pressure.  She had some red blood in the rectum in the emergency department but did not appear to have active bleeding.  Panel notable for hemoglobin 11.6.  INR 2.2.  Given IV fluids in the ED, type and screen was performed, ED PA requested GI consultation, and hospitalists asked to admit.  Review of Systems:  All other systems reviewed and apart from HPI, are negative.  Past Medical History:  Diagnosis Date  . Alopecia   . Atrial fibrillation (HCC)   . Bradycardia   . BRADYCARDIA 04/19/2008   Qualifier: Diagnosis of  By: Bascom Levels, RMA, Sherri    . CHEST PAIN 04/22/2008   Qualifier: Diagnosis of  By: Graciela Husbands, MD, Susie Cassette   . Closed right hip fracture (HCC) 04/26/2014  . Community acquired pneumonia 03/28/2014  . Corn or callus   . Dehydration, moderate 03/28/2014  . Depression 03/28/2014  . Diverticulitis of colon with perforation s/p colectomy/colostomy 12/25/2015 12/24/2015  . Diverticulitis of large intestine with abscess without  bleeding   . Epigastric pain   . Essential hypertension 04/19/2008   Qualifier: Diagnosis of  By: Bascom Levels, RMA, Sherri    . GERD (gastroesophageal reflux disease) 03/28/2014  . Hemoptysis   . Hip fracture requiring operative repair (HCC) 04/25/2014  . Hypokalemia   . Hyponatremia 03/27/2014  . Hypothyroidism 03/28/2014  . Perforated bowel (HCC) 12/25/2015  . Protein-calorie malnutrition, severe 12/25/2015  . Small bowel obstruction (HCC) 12/24/2015  . Unspecified essential hypertension   . Urinary frequency     Past Surgical History:  Procedure Laterality Date  . HIP ARTHROPLASTY Right 04/27/2014   Procedure: ARTHROPLASTY CEMENTED HEMI HIP;  Surgeon: Marcene Corning, MD;  Location: [facility];  Service: Orthopedics;  Laterality: Right;  . hysterectomy, unspecifed area    . LAPAROTOMY N/A 12/25/2015   Procedure: EXPLORATORY LAPAROTOMY, SIGMOID COLECTOMY, COLOSTOMY;  Surgeon: Harriette Bouillon, MD;  Location: [facility];  Service: General;  Laterality: N/A;  . neuropathy      Social History:   reports that she has quit smoking. She has never used smokeless tobacco. She reports current alcohol use. She reports that she does not use drugs.  Allergies  Allergen Reactions  . Amlodipine Besylate     Other reaction(s): swelling  . Doxycycline     Other reaction(s): diarrhea, muscle stiffness  . Hydrocodone Bit-Homatrop Mbr     Other reaction(s): itching  . Terbinafine Hcl     Other reaction(s): stomach pain,  insomnia  . Tramadol Hcl  Other reaction(s): Upset stomach    Family History  Problem Relation Age of Onset  . Cancer Other        family history of it  . Coronary artery disease Other        family history of it  . Colon cancer Mother   . Heart disease Father   . Heart disease Sister   . Heart disease Brother   . Cancer Maternal Grandfather   . Prostate cancer Brother      Prior to Admission medications   Medication Sig Start Date End Date Taking? Authorizing Provider   benazepril (LOTENSIN) 40 MG tablet Take 40 mg by mouth daily.   Yes [provider]  cyanocobalamin 100 MCG tablet Take 100 mcg by mouth daily.   Yes [provider]  furosemide (LASIX) 20 MG tablet Take 20 mg by mouth daily. 04/17/20  Yes [provider]  nitroGLYCERIN (NITROSTAT) 0.4 MG SL tablet Place 0.4 mg under the tongue every 5 (five) minutes as needed for chest pain.   Yes [provider]  warfarin (COUMADIN) 2.5 MG tablet Take 2.5 mg by mouth every evening.   Yes [provider]  levothyroxine (SYNTHROID, LEVOTHROID) 25 MCG tablet Take 25 mcg by mouth daily before breakfast. Patient not taking: Reported on 06/12/2020    [provider]    Physical Exam: Vitals:   06/12/20 2200 06/12/20 2230 06/12/20 2300 06/12/20 2330  BP: (!) 171/82 (!) 163/73 (!) 171/87 (!) 175/118  Pulse: (!) 59 60 64 60  Resp: 14 15 16 14   Temp:      TempSrc:      SpO2: 97% 97% 98% 95%  Weight:      Height:        Constitutional: NAD, calm  Eyes: PERTLA, lids and conjunctivae normal ENMT: Mucous membranes are moist. Posterior pharynx clear of any exudate or lesions.   Neck:  supple, no masses  Respiratory: no wheezing, no crackles. No accessory muscle use.  Cardiovascular: Rate ~60 and irregular. No extremity edema.  Abdomen: No distension, no tenderness, soft. Bowel sounds active.  Musculoskeletal: no clubbing / cyanosis. No joint deformity upper and lower extremities.   Skin: no significant rashes, lesions, ulcers. Warm, dry, well-perfused. Neurologic: CN 2-12 grossly intact. Sensation intact. Moving all extremities.  Psychiatric: Alert and oriented to person, place, and situation. Pleasant and cooperative.    Labs and Imaging on Admission: I have personally reviewed following labs and imaging studies  CBC: Recent Labs  Lab 06/12/20 2021  WBC 6.1  NEUTROABS 4.8  HGB 11.6*  HCT 36.9  MCV 93.2  PLT 247   Basic Metabolic Panel: Recent  Labs  Lab 06/12/20 2021  NA 137  K 4.6  CL 101  CO2 28  GLUCOSE 95  BUN 29*  CREATININE 0.82  CALCIUM 9.3   GFR: Estimated Creatinine Clearance: 30.3 mL/min (by C-G formula based on SCr of 0.82 mg/dL). Liver Function Tests: Recent Labs  Lab 06/12/20 2021  AST 29  ALT 12  ALKPHOS 57  BILITOT 0.6  PROT 7.1  ALBUMIN 4.2   No results for input(s): LIPASE, AMYLASE in the last 168 hours. No results for input(s): AMMONIA in the last 168 hours. Coagulation Profile: Recent Labs  Lab 06/12/20 2021  INR 2.2*   Cardiac Enzymes: No results for input(s): CKTOTAL, CKMB, CKMBINDEX, TROPONINI in the last 168 hours. BNP (last 3 results) No results for input(s): PROBNP in the last 8760 hours. HbA1C: No results for  input(s): HGBA1C in the last 72 hours. CBG: No results for input(s): GLUCAP in the last 168 hours. Lipid Profile: No results for input(s): CHOL, HDL, LDLCALC, TRIG, CHOLHDL, LDLDIRECT in the last 72 hours. Thyroid Function Tests: No results for input(s): TSH, T4TOTAL, FREET4, T3FREE, THYROIDAB in the last 72 hours. Anemia Panel: No results for input(s): VITAMINB12, FOLATE, FERRITIN, TIBC, IRON, RETICCTPCT in the last 72 hours. Urine analysis:    Component Value Date/Time   COLORURINE YELLOW 12/31/2015 1540   APPEARANCEUR HAZY (A) 12/31/2015 1540   LABSPEC 1.028 12/31/2015 1540   PHURINE 5.0 12/31/2015 1540   GLUCOSEU NEGATIVE 12/31/2015 1540   HGBUR SMALL (A) 12/31/2015 1540   BILIRUBINUR NEGATIVE 12/31/2015 1540   KETONESUR NEGATIVE 12/31/2015 1540   PROTEINUR 30 (A) 12/31/2015 1540   UROBILINOGEN 0.2 04/25/2014 2228   NITRITE NEGATIVE 12/31/2015 1540   LEUKOCYTESUR TRACE (A) 12/31/2015 1540   Sepsis Labs: @LABRCNTIP (procalcitonin:4,lacticidven:4) ) Recent Results (from the past 240 hour(s))  Resp Panel by RT-PCR (Flu A&B, Covid) Nasopharyngeal Swab     Status: None   Collection Time: 06/12/20 10:44 PM   Specimen: Nasopharyngeal Swab; Nasopharyngeal(NP)  swabs in vial transport medium  Result Value Ref Range Status   SARS Coronavirus 2 by RT PCR NEGATIVE NEGATIVE Final    Comment: (NOTE) SARS-CoV-2 target nucleic acids are NOT DETECTED.  The SARS-CoV-2 RNA is generally detectable in upper respiratory specimens during the acute phase of infection. The lowest concentration of SARS-CoV-2 viral copies this assay can detect is 138 copies/mL. A negative result does not preclude SARS-Cov-2 infection and should not be used as the sole basis for treatment or other patient management decisions. A negative result may occur with  improper specimen collection/handling, submission of specimen other than nasopharyngeal swab, presence of viral mutation(s) within the areas targeted by this assay, and inadequate number of viral copies(<138 copies/mL). A negative result must be combined with clinical observations, patient history, and epidemiological information. The expected result is Negative.  Fact Sheet for Patients:  08/12/20  Fact Sheet for Healthcare Providers:  BloggerCourse.com  This test is no t yet approved or cleared by the [employer]iousBroker.[place] FDA and  has been authorized for detection and/or diagnosis of SARS-CoV-2 by FDA under an Emergency Use Authorization (EUA). This EUA will remain  in effect (meaning this test can be used) for the duration of the COVID-19 declaration under Section 564(b)(1) of the Act, 21 U.S.C.section 360bbb-3(b)(1), unless the authorization is terminated  or revoked sooner.       Influenza A by PCR NEGATIVE NEGATIVE Final   Influenza B by PCR NEGATIVE NEGATIVE Final    Comment: (NOTE) The Xpert Xpress SARS-CoV-2/FLU/RSV plus assay is intended as an aid in the diagnosis of influenza from Nasopharyngeal swab specimens and should not be used as a sole basis for treatment. Nasal washings and aspirates are unacceptable for Xpert Xpress  SARS-CoV-2/FLU/RSV testing.  Fact Sheet for Patients: [place]  Fact Sheet for Healthcare Providers: BloggerCourse.com  This test is not yet approved or cleared by the SeriousBroker.it FDA and has been authorized for detection and/or diagnosis of SARS-CoV-2 by FDA under an Emergency Use Authorization (EUA). This EUA will remain in effect (meaning this test can be used) for the duration of the COVID-19 declaration under Section 564(b)(1) of the Act, 21 U.S.C. section 360bbb-3(b)(1), unless the authorization is terminated or revoked.  Performed at [facility], [address] [place], [place]      Radiological Exams on Admission:  No results found.   Assessment/Plan   1. Rectal bleeding  - Patient on warfarin for a fib and status post sigmoid colectomy and colostomy in 2017 presents with bright red blood per rectum  - Initial Hgb 11.6 and vitals normal, INR 2.2, and only scant amount of BRBPR since arrival in ED - ED PA sent message to GI with request for routine consult  - Hold warfarin, trend H&H, consider reversing anticoagulation and imaging if there is brisk bleeding   2. Atrial fibrillation  - Rate is controlled, hold warfarin pending GI eval   3. Hypertension  - Hold scheduled antihypertensives in light of acute GI bleeding and treat as-needed only for now      DVT prophylaxis: SCDs  Code Status: DNR   Level of Care: Level of care: Telemetry Family Communication: Niece updated at bedside   Disposition Plan:  Patient is from: ILF  Anticipated d/c is to: TBD  Anticipated d/c date is: 6/4 or 06/14/20  Patient currently: Pending GI consultation, trending H&H  Consults called: ED PA sent message to GI with routine consult request  Admission status: Observation     Briscoe Deutscher, MD [facility]  06/13/2020, 12:44 AM

## 2020-06-13 NOTE — ED Notes (Signed)
Patient is ready for admission- room 612

## 2020-06-13 NOTE — ED Notes (Signed)
Rectal exam

## 2020-06-13 NOTE — Consult Note (Signed)
[facility] Gastroenterology Consultation Note  Referring Provider: No ref. provider found Primary Care Physician:  Daisy Floro, MD  Reason for Consultation:  Blood in stool.  HPI: Candace Cain is a 87 y.o. female blood per rectum since last night.  Bright red blood and some clots.  Has LLQ colostomy after perforated complicated diverticulitis in 2017, and has what appears to be Hartmann's pouch.  No stool effluent from rectum post-operatively.  Blood and clots per rectum starting yesterday.  On warfarin, INR 2.4.  No abdominal pain.  Past Medical History:  Diagnosis Date  . Alopecia   . Atrial fibrillation (HCC)   . Bradycardia   . BRADYCARDIA 04/19/2008   Qualifier: Diagnosis of  By: Bascom Levels, RMA, Sherri    . CHEST PAIN 04/22/2008   Qualifier: Diagnosis of  By: Graciela Husbands, MD, Susie Cassette   . Closed right hip fracture (HCC) 04/26/2014  . Community acquired pneumonia 03/28/2014  . Corn or callus   . Dehydration, moderate 03/28/2014  . Depression 03/28/2014  . Diverticulitis of colon with perforation s/p colectomy/colostomy 12/25/2015 12/24/2015  . Diverticulitis of large intestine with abscess without bleeding   . Epigastric pain   . Essential hypertension 04/19/2008   Qualifier: Diagnosis of  By: Bascom Levels, RMA, Sherri    . GERD (gastroesophageal reflux disease) 03/28/2014  . Hemoptysis   . Hip fracture requiring operative repair (HCC) 04/25/2014  . Hypokalemia   . Hyponatremia 03/27/2014  . Hypothyroidism 03/28/2014  . Perforated bowel (HCC) 12/25/2015  . Protein-calorie malnutrition, severe 12/25/2015  . Small bowel obstruction (HCC) 12/24/2015  . Unspecified essential hypertension   . Urinary frequency     Past Surgical History:  Procedure Laterality Date  . HIP ARTHROPLASTY Right 04/27/2014   Procedure: ARTHROPLASTY CEMENTED HEMI HIP;  Surgeon: Marcene Corning, MD;  Location: [facility];  Service: Orthopedics;  Laterality: Right;  . hysterectomy, unspecifed area    . LAPAROTOMY  N/A 12/25/2015   Procedure: EXPLORATORY LAPAROTOMY, SIGMOID COLECTOMY, COLOSTOMY;  Surgeon: Harriette Bouillon, MD;  Location: [facility];  Service: General;  Laterality: N/A;  . neuropathy      Prior to Admission medications   Medication Sig Start Date End Date Taking? Authorizing Provider  benazepril (LOTENSIN) 40 MG tablet Take 40 mg by mouth daily.   Yes [provider]  cyanocobalamin 100 MCG tablet Take 100 mcg by mouth daily.   Yes [provider]  furosemide (LASIX) 20 MG tablet Take 20 mg by mouth daily. 04/17/20  Yes [provider]  nitroGLYCERIN (NITROSTAT) 0.4 MG SL tablet Place 0.4 mg under the tongue every 5 (five) minutes as needed for chest pain.   Yes [provider]  warfarin (COUMADIN) 2.5 MG tablet Take 2.5 mg by mouth every evening.   Yes [provider]  levothyroxine (SYNTHROID, LEVOTHROID) 25 MCG tablet Take 25 mcg by mouth daily before breakfast. Patient not taking: Reported on 06/12/2020    [provider]    Current Facility-Administered Medications  Medication Dose Route Frequency Provider Last Rate Last Admin  . 0.9 %  sodium chloride infusion   Intravenous Continuous Opyd, Lavone Neri, MD 85 mL/hr at 06/13/20 1321 New Bag at 06/13/20 1321  . acetaminophen (TYLENOL) tablet 650 mg  650 mg Oral Q6H PRN Opyd, Lavone Neri, MD       Or  . acetaminophen (TYLENOL) suppository 650 mg  650 mg Rectal Q6H PRN Opyd, Lavone Neri, MD      . hydrALAZINE (APRESOLINE) tablet 25 mg  25 mg Oral Q8H PRN Opyd, Lavone Neri, MD      . ondansetron (ZOFRAN) tablet 4 mg  4 mg Oral Q6H PRN Opyd, Lavone Neri, MD       Or  . ondansetron (ZOFRAN) injection 4 mg  4 mg Intravenous Q6H PRN Opyd, Lavone Neri, MD       Current Outpatient Medications  Medication Sig Dispense Refill  . benazepril (LOTENSIN) 40 MG tablet Take 40 mg by mouth daily.    . cyanocobalamin 100 MCG tablet Take 100 mcg by mouth daily.    . furosemide (LASIX) 20 MG tablet Take 20 mg by  mouth daily.    . nitroGLYCERIN (NITROSTAT) 0.4 MG SL tablet Place 0.4 mg under the tongue every 5 (five) minutes as needed for chest pain.    Marland Kitchen warfarin (COUMADIN) 2.5 MG tablet Take 2.5 mg by mouth every evening.    Marland Kitchen levothyroxine (SYNTHROID, LEVOTHROID) 25 MCG tablet Take 25 mcg by mouth daily before breakfast. (Patient not taking: Reported on 06/12/2020)      Allergies as of 06/12/2020 - Review Complete 06/12/2020  Allergen Reaction Noted  . Amlodipine besylate  04/16/2020  . Doxycycline  04/16/2020  . Hydrocodone bit-homatrop mbr  04/16/2020  . Terbinafine hcl  04/16/2020  . Tramadol hcl  04/16/2020    Family History  Problem Relation Age of Onset  . Cancer Other        family history of it  . Coronary artery disease Other        family history of it  . Colon cancer Mother   . Heart disease Father   . Heart disease Sister   . Heart disease Brother   . Cancer Maternal Grandfather   . Prostate cancer Brother     Social History   Socioeconomic History  . Marital status: Married    Spouse name: Not on file  . Number of children: Not on file  . Years of education: Not on file  . Highest education level: Not on file  Occupational History  . Not on file  Tobacco Use  . Smoking status: Former Games developer  . Smokeless tobacco: Never Used  Substance and Sexual Activity  . Alcohol use: Yes    Comment: occasional wine   . Drug use: No  . Sexual activity: Not on file  Other Topics Concern  . Not on file  Social History Narrative   Married and retired.    Social Determinants of Health   Financial Resource Strain: Not on file  Food Insecurity: Not on file  Transportation Needs: Not on file  Physical Activity: Not on file  Stress: Not on file  Social Connections: Not on file  Intimate Partner Violence: Not on file    Review of Systems: As per HPI, all others negative  Physical Exam: Vital signs in last 24 hours: Temp:  [98.1 F (36.7 C)] 98.1 F (36.7 C) (06/03  2005) Pulse Rate:  [45-97] 58 (06/04 1300) Resp:  [11-19] 17 (06/04 1300) BP: (143-187)/(64-146) 181/115 (06/04 1300) SpO2:  [92 %-100 %] 95 % (06/04 1300) Weight:  [54.4 kg] 54.4 kg (06/03 1958)   General:   Alert,  Elderly but younger-appearing than stated age, Well-developed, well-nourished, pleasant and cooperative in NAD Head:  Normocephalic and atraumatic. Eyes:  Sclera clear, no icterus.   Conjunctiva pink. Ears:  Normal auditory acuity. Nose:  No deformity, discharge,  or lesions. Mouth:  No deformity or lesions.  Oropharynx pink & moist. Neck:  Supple;  no masses or thyromegaly. Abdomen:  Soft, post-operative abdominal wall hernias; LLQ ostomy without blood. No masses, hepatosplenomegaly or hernias noted. Normal bowel sounds, without guarding, and without rebound.   Rectal:  Read Drivers as chaperone; external hemorrhoids; blood seems to be anorectal (and not vaginal); very tender anal canal limiting examination   Msk:  Symmetrical without gross deformities. Normal posture. Pulses:  Normal pulses noted. Extremities:  Without clubbing or edema. Neurologic:  Alert and  oriented x4;  grossly normal neurologically. Skin:  Intact without significant lesions or rashes. Cervical Nodes:  No significant cervical adenopathy. Psych:  Alert and cooperative. Normal mood and affect.   Lab Results: Recent Labs    06/12/20 2021 06/13/20 0044 06/13/20 0844  WBC 6.1  --   --   HGB 11.6* 11.5* 10.8*  HCT 36.9 36.0 34.0*  PLT 247  --   --    BMET Recent Labs    06/12/20 2021 06/13/20 0445  NA 137 139  K 4.6 3.6  CL 101 107  CO2 28 26  GLUCOSE 95 96  BUN 29* 24*  CREATININE 0.82 0.42*  CALCIUM 9.3 8.7*   LFT Recent Labs    06/12/20 2021  PROT 7.1  ALBUMIN 4.2  AST 29  ALT 12  ALKPHOS 57  BILITOT 0.6   PT/INR Recent Labs    06/12/20 2021 06/13/20 0445  LABPROT 24.2* 25.8*  INR 2.2* 2.4*    Studies/Results: No results found.  Impression:  1.  Blood per rectum  (via Hartmann's pouch?). 2.  Chronic anticoagulation, INR 2.4. 3.  Anorectal tenderness. 4.  History complicated perforated diverticulitis requiring sigmoid colectomy and ostomy and (as best I can tell) Hartmann's pouch.  Plan:  1.  Serial CBCs, transfuse if needed. 2.  Hold anticoagulation. 3.  If pain/bleeding worsens, consider CT scan abd/pelvis (with contrast if possible). Doubt utility of tagged RBC study. 4.  Topical anti-hemorrhoidal therapies. 5.  If bleeding persists despite conservative therapies, consider sigmoidoscopy per rectum in a couple days to assess for diversion colitis or other causes of bleeding. 6.  [facility] will follow.   LOS: 0 days   Anibal Quinby M  06/13/2020, 1:38 PM  Cell [phone] If no answer or after 5 PM call [phone]

## 2020-06-13 NOTE — ED Notes (Signed)
1 episode of dark, bloody with clots from rectum.  Patient was assisted back to bed and cleaned.

## 2020-06-13 NOTE — ED Notes (Signed)
Gurney Maxin, RN was sent a secure message in regards to admission information and bed assignment

## 2020-06-13 NOTE — Progress Notes (Signed)
PROGRESS NOTE    Candace Cain  WUX:[id number] DOB: 09-Feb-1920 DOA: 06/12/2020 PCP: Candace Floro, MD   Chief Complaint  Patient presents with  . Rectal Bleeding   Brief Narrative:  Candace Cain is Candace Cain 87 y.o. female with medical history significant for atrial fibrillation on Coumadin,, hypothyroidism, perforated diverticulitis status post sigmoid colectomy and colostomy in 2017, now presenting to the emergency department red blood per rectum.  Patient reports that she was in her usual state and having an uneventful day until approximately 6 PM when she reports passing Candace Cain large volume of bright red blood on the toilet.  She has not seen any blood in her colostomy bag, has not had any abdominal or rectal pain, denies trauma, and has never experienced this before.  Family at bedside notes that they saw some red blood clots.  ED Course: Upon arrival to the ED, patient is found to be afebrile, saturating well on room air, and with normal heart rate and blood pressure.  She had some red blood in the rectum in the emergency department but did not appear to have active bleeding.  Panel notable for hemoglobin 11.6.  INR 2.2.  Given IV fluids in the ED, type and screen was performed, ED PA requested GI consultation, and hospitalists asked to admit.    Assessment & Plan:   Principal Problem:   Rectal bleeding Active Problems:   Essential hypertension   Atrial fibrillation (HCC)   Hypothyroidism   Diverticulitis of colon with perforation s/p colectomy/colostomy 12/25/2015  1. Rectal bleeding  Anemia  - Patient on warfarin for Candace Cain fib and status post sigmoid colectomy and colostomy in 2017 presents with bright red blood per rectum  - Initial Hgb 11.6  - Hb relatively stable today - INR 2.4, holding warfarin - GI c/s, appreciate recommendations - recommending topical antihemorrhoidal therapies, if bleeding persists, consider sigmoidoscopy per rectum to assess for diversion colitis or other causes  of bleeding.  If pain/bleeding worsens, consider CT abd/pelvis.  Hold anticoagulation.  2. Atrial fibrillation  - Rate is controlled, hold warfarin pending GI eval  - INR 2.4 today  3. Hypertension  - Hold scheduled antihypertensives in light of acute GI bleeding and treat as-needed only for now    # Anemia Follow iron, b12, folate, ferritin  DVT prophylaxis: SCD Code Status: DNR Family Communication: none at bedside Disposition:   Status is: Observation  The patient will require care spanning > 2 midnights and should be moved to inpatient because: Inpatient level of care appropriate due to severity of illness  Dispo: The patient is from: Home              Anticipated d/c is to: Home              Patient currently is not medically stable to d/c.   Difficult to place patient No       Consultants:   GI  Procedures:  none  Antimicrobials: Anti-infectives (From admission, onward)   None         Subjective: Concern for bleeding   Objective: Vitals:   06/13/20 1200 06/13/20 1230 06/13/20 1300 06/13/20 1500  BP: (!) 170/87 (!) 187/76 (!) 181/115 (!) 155/97  Pulse: (!) 51 (!) 57 (!) 58 (!) 55  Resp: 16 16 17 18   Temp:    98.2 F (36.8 C)  TempSrc:    Oral  SpO2: 97% 97% 95% 98%  Weight:      Height:  Intake/Output Summary (Last 24 hours) at 06/13/2020 1525 Last data filed at 06/13/2020 1320 Gross per 24 hour  Intake 1400 ml  Output --  Net 1400 ml   Filed Weights   06/12/20 1958  Weight: 54.4 kg    Examination:  General exam: Appears calm and comfortable  Respiratory system: Clear to auscultation. Respiratory effort normal. Cardiovascular system: S1 & S2 heard, RRR. Gastrointestinal system: Abdomen is nondistended, soft and nontender Central nervous system: Alert and oriented. No focal neurological deficits. Extremities: no LEE Skin: No rashes, lesions or ulcers Psychiatry: Judgement and insight appear normal. Mood & affect appropriate.      Data Reviewed: I have personally reviewed following labs and imaging studies  CBC: Recent Labs  Lab 06/12/20 2021 06/13/20 0044 06/13/20 0844  WBC 6.1  --   --   NEUTROABS 4.8  --   --   HGB 11.6* 11.5* 10.8*  HCT 36.9 36.0 34.0*  MCV 93.2  --   --   PLT 247  --   --     Basic Metabolic Panel: Recent Labs  Lab 06/12/20 2021 06/13/20 0445  NA 137 139  K 4.6 3.6  CL 101 107  CO2 28 26  GLUCOSE 95 96  BUN 29* 24*  CREATININE 0.82 0.42*  CALCIUM 9.3 8.7*    GFR: Estimated Creatinine Clearance: 31.1 mL/min (Candace Cain) (by C-G formula based on SCr of 0.42 mg/dL (L)).  Liver Function Tests: Recent Labs  Lab 06/12/20 2021  AST 29  ALT 12  ALKPHOS 57  BILITOT 0.6  PROT 7.1  ALBUMIN 4.2    CBG: No results for input(s): GLUCAP in the last 168 hours.   Recent Results (from the past 240 hour(s))  Resp Panel by RT-PCR (Flu Candace Cain&B, Covid) Nasopharyngeal Swab     Status: None   Collection Time: 06/12/20 10:44 PM   Specimen: Nasopharyngeal Swab; Nasopharyngeal(NP) swabs in vial transport medium  Result Value Ref Range Status   SARS Coronavirus 2 by RT PCR NEGATIVE NEGATIVE Final    Comment: (NOTE) SARS-CoV-2 target nucleic acids are NOT DETECTED.  The SARS-CoV-2 RNA is generally detectable in upper respiratory specimens during the acute phase of infection. The lowest concentration of SARS-CoV-2 viral copies this assay can detect is 138 copies/mL. Candace Cain negative result does not preclude SARS-Cov-2 infection and should not be used as the sole basis for treatment or other patient management decisions. Candace Cain negative result may occur with  improper specimen collection/handling, submission of specimen other than nasopharyngeal swab, presence of viral mutation(s) within the areas targeted by this assay, and inadequate number of viral copies(<138 copies/mL). Candace Cain negative result must be combined with clinical observations, patient history, and epidemiological information. The expected  result is Negative.  Fact Sheet for Patients:  BloggerCourse.com  Fact Sheet for Healthcare Providers:  SeriousBroker.it  This test is no t yet approved or cleared by the [place] FDA and  has been authorized for detection and/or diagnosis of SARS-CoV-2 by FDA under an Emergency Use Authorization (EUA). This EUA will remain  in effect (meaning this test can be used) for the duration of the COVID-19 declaration under Section 564(b)(1) of the Act, 21 U.S.C.section 360bbb-3(b)(1), unless the authorization is terminated  or revoked sooner.       Influenza Amylia Collazos by PCR NEGATIVE NEGATIVE Final   Influenza B by PCR NEGATIVE NEGATIVE Final    Comment: (NOTE) The Xpert Xpress SARS-CoV-2/FLU/RSV plus assay is intended as an aid in the diagnosis of influenza from  Nasopharyngeal swab specimens and should not be used as Latayvia Mandujano sole basis for treatment. Nasal washings and aspirates are unacceptable for Xpert Xpress SARS-CoV-2/FLU/RSV testing.  Fact Sheet for Patients: BloggerCourse.com  Fact Sheet for Healthcare Providers: SeriousBroker.it  This test is not yet approved or cleared by the [place] FDA and has been authorized for detection and/or diagnosis of SARS-CoV-2 by FDA under an Emergency Use Authorization (EUA). This EUA will remain in effect (meaning this test can be used) for the duration of the COVID-19 declaration under Section 564(b)(1) of the Act, 21 U.S.C. section 360bbb-3(b)(1), unless the authorization is terminated or revoked.  Performed at [facility], [address], [place], [place] [address]          Radiology Studies: No results found.      Scheduled Meds: . hydrocortisone  25 mg Rectal BID   Continuous Infusions: . sodium chloride 85 mL/hr at 06/13/20 1321     LOS: 0 days    Time spent: over 30 min    Lacretia Nicks,  MD [facility]   To contact the attending provider between 7A-7P or the covering provider during after hours 7P-7A, please log into the web site [email or web address] and access using universal [place] password for that web site. If you do not have the password, please call the hospital operator.  06/13/2020, 3:25 PM

## 2020-06-13 NOTE — ED Notes (Signed)
The patient is resting quietly in bed.  Answers all questions appropriately.  Awaiting admission.

## 2020-06-13 NOTE — ED Notes (Signed)
Updated the patient's aunt [facility]

## 2020-06-14 LAB — PROTIME-INR
INR: 2.5 — ABNORMAL HIGH (ref 0.8–1.2)
Prothrombin Time: 26.8 seconds — ABNORMAL HIGH (ref 11.4–15.2)

## 2020-06-14 LAB — CBC WITH DIFFERENTIAL/PLATELET
Abs Immature Granulocytes: 0.01 10*3/uL (ref 0.00–0.07)
Basophils Absolute: 0 10*3/uL (ref 0.0–0.1)
Basophils Relative: 0 %
Eosinophils Absolute: 0.2 10*3/uL (ref 0.0–0.5)
Eosinophils Relative: 3 %
HCT: 25.7 % — ABNORMAL LOW (ref 36.0–46.0)
Hemoglobin: 8 g/dL — ABNORMAL LOW (ref 12.0–15.0)
Immature Granulocytes: 0 %
Lymphocytes Relative: 23 %
Lymphs Abs: 1.4 10*3/uL (ref 0.7–4.0)
MCH: 29.5 pg (ref 26.0–34.0)
MCHC: 31.1 g/dL (ref 30.0–36.0)
MCV: 94.8 fL (ref 80.0–100.0)
Monocytes Absolute: 0.5 10*3/uL (ref 0.1–1.0)
Monocytes Relative: 8 %
Neutro Abs: 3.8 10*3/uL (ref 1.7–7.7)
Neutrophils Relative %: 66 %
Platelets: 203 10*3/uL (ref 150–400)
RBC: 2.71 MIL/uL — ABNORMAL LOW (ref 3.87–5.11)
RDW: 14.6 % (ref 11.5–15.5)
WBC: 5.9 10*3/uL (ref 4.0–10.5)
nRBC: 0 % (ref 0.0–0.2)

## 2020-06-14 LAB — COMPREHENSIVE METABOLIC PANEL
ALT: 10 U/L (ref 0–44)
AST: 15 U/L (ref 15–41)
Albumin: 3.2 g/dL — ABNORMAL LOW (ref 3.5–5.0)
Alkaline Phosphatase: 44 U/L (ref 38–126)
Anion gap: 5 (ref 5–15)
BUN: 23 mg/dL (ref 8–23)
CO2: 24 mmol/L (ref 22–32)
Calcium: 8.3 mg/dL — ABNORMAL LOW (ref 8.9–10.3)
Chloride: 108 mmol/L (ref 98–111)
Creatinine, Ser: 0.57 mg/dL (ref 0.44–1.00)
GFR, Estimated: >60 mL/min (ref >60)
Glucose, Bld: 90 mg/dL (ref 70–99)
Potassium: 3.6 mmol/L (ref 3.5–5.1)
Sodium: 137 mmol/L (ref 135–145)
Total Bilirubin: 0.7 mg/dL (ref 0.3–1.2)
Total Protein: 5.5 g/dL — ABNORMAL LOW (ref 6.5–8.1)

## 2020-06-14 LAB — MAGNESIUM: Magnesium: 1.8 mg/dL (ref 1.7–2.4)

## 2020-06-14 LAB — HEMOGLOBIN AND HEMATOCRIT, BLOOD
HCT: 25.7 % — ABNORMAL LOW (ref 36.0–46.0)
Hemoglobin: 8.1 g/dL — ABNORMAL LOW (ref 12.0–15.0)

## 2020-06-14 LAB — PHOSPHORUS: Phosphorus: 2.6 mg/dL (ref 2.5–4.6)

## 2020-06-14 MED ORDER — VITAMIN K1 10 MG/ML IJ SOLN
1.0000 mg | Freq: Once | INTRAVENOUS | Status: DC
  Filled 2020-06-14: qty 0.1

## 2020-06-14 MED ORDER — VITAMIN K1 10 MG/ML IJ SOLN
2.5000 mg | Freq: Once | INTRAVENOUS | Status: AC
  Administered 2020-06-14: 2.5 mg via INTRAVENOUS
  Filled 2020-06-14: qty 0.25

## 2020-06-14 NOTE — Plan of Care (Signed)
?  Problem: Clinical Measurements: ?Goal: Respiratory complications will improve ?Outcome: Progressing ?  ?Problem: Clinical Measurements: ?Goal: Cardiovascular complication will be avoided ?Outcome: Progressing ?  ?Problem: Coping: ?Goal: Level of anxiety will decrease ?Outcome: Progressing ?  ?Problem: Elimination: ?Goal: Will not experience complications related to bowel motility ?Outcome: Progressing ?  ?

## 2020-06-14 NOTE — Progress Notes (Signed)
Subjective: Some persistent blood per rectum.  Objective: Vital signs in last 24 hours: Temp:  [97.9 F (36.6 C)-98.5 F (36.9 C)] 97.9 F (36.6 C) (06/05 0430) Pulse Rate:  [55-69] 69 (06/05 0430) Resp:  [14-18] 14 (06/05 0430) BP: (133-181)/(67-115) 133/67 (06/05 0430) SpO2:  [94 %-99 %] 94 % (06/05 0430) Weight change:  Last BM Date: 06/13/20  PE: GEN:  NAD,  Younger-appearing than stated age ABD:  Ostomy LLQ without blood; soft, abdominal hernias, non-tender  Lab Results: CBC    Component Value Date/Time   WBC 5.9 06/14/2020 0023   RBC 2.71 (L) 06/14/2020 0023   HGB 8.0 (L) 06/14/2020 0023   HCT 25.7 (L) 06/14/2020 0023   PLT 203 06/14/2020 0023   MCV 94.8 06/14/2020 0023   MCH 29.5 06/14/2020 0023   MCHC 31.1 06/14/2020 0023   RDW 14.6 06/14/2020 0023   LYMPHSABS 1.4 06/14/2020 0023   MONOABS 0.5 06/14/2020 0023   EOSABS 0.2 06/14/2020 0023   BASOSABS 0.0 06/14/2020 0023   CMP     Component Value Date/Time   NA 137 06/14/2020 0023   K 3.6 06/14/2020 0023   CL 108 06/14/2020 0023   CO2 24 06/14/2020 0023   GLUCOSE 90 06/14/2020 0023   BUN 23 06/14/2020 0023   CREATININE 0.57 06/14/2020 0023   CALCIUM 8.3 (L) 06/14/2020 0023   PROT 5.5 (L) 06/14/2020 0023   ALBUMIN 3.2 (L) 06/14/2020 0023   AST 15 06/14/2020 0023   ALT 10 06/14/2020 0023   ALKPHOS 44 06/14/2020 0023   BILITOT 0.7 06/14/2020 0023   GFRNONAA >60 06/14/2020 0023   GFRAA >60 01/03/2016 0527    Assessment:  1.  Blood per rectum (via Hartmann's pouch?). 2.  Chronic anticoagulation, INR 2.4. 3.  Anorectal tenderness. 4.  History complicated perforated diverticulitis requiring sigmoid colectomy and ostomy and (as best I can tell) Hartmann's pouch.  Plan:  1.  If bleeding persists per rectum once INR < 2, would have to consider sigmoidoscopy per rectum to assess for diversion colitis or other etiologies. 2.  Supportive care for now (hemorrhoidal suppositories, transfusion as needed). 3.   Full liquid diet ok. 4.  [facility] will follow.   Freddy Jaksch 06/14/2020, 12:47 PM   Cell [phone] If no answer or after 5 PM call [phone]

## 2020-06-14 NOTE — Progress Notes (Addendum)
PROGRESS NOTE    Candace Cain  FFM:[id number] DOB: [birth year] DOA: 06/12/2020 PCP: Daisy Floro, MD   Chief Complaint  Patient presents with  . Rectal Bleeding   Brief Narrative:  Candace Cain is Candace Cain 87 y.o. female with medical history significant for atrial fibrillation on Coumadin,, hypothyroidism, perforated diverticulitis status post sigmoid colectomy and colostomy in 2017, now presenting to the emergency department red blood per rectum.  Patient reports that she was in her usual state and having an uneventful day until approximately 6 PM when she reports passing Candace Cain large volume of bright red blood on the toilet.  She has not seen any blood in her colostomy bag, has not had any abdominal or rectal pain, denies trauma, and has never experienced this before.  Family at bedside notes that they saw some red blood clots.  ED Course: Upon arrival to the ED, patient is found to be afebrile, saturating well on room air, and with normal heart rate and blood pressure.  She had some red blood in the rectum in the emergency department but did not appear to have active bleeding.  Panel notable for hemoglobin 11.6.  INR 2.2.  Given IV fluids in the ED, type and screen was performed, ED PA requested GI consultation, and hospitalists asked to admit.  Assessment & Plan:   Principal Problem:   Rectal bleeding Active Problems:   Essential hypertension   Atrial fibrillation (HCC)   Hypothyroidism   Diverticulitis of colon with perforation s/p colectomy/colostomy 12/25/2015  1. Rectal bleeding  Anemia  - Patient on warfarin for Candace Cain fib and status post sigmoid colectomy and colostomy in 2017 presents with bright red blood per rectum  - Initial Hgb 11.6  - Hb downtrending today - INR 2.5, holding warfarin - give Candace Cain dose of vitamin K given continued bleeding with downtrending Hb - Repeat Hb, trend - transfuse for symptomatic or Hb <7 - discussed risks/benefits, she's agreeable - GI c/s, appreciate  recommendations - recommending topical antihemorrhoidal therapies, if bleeding persists when INR <2, consider sigmoidoscopy per rectum to assess for diversion colitis or other causes of bleeding.  If pain/bleeding worsens, consider CT abd/pelvis.  Hold anticoagulation.  2. Atrial fibrillation  - Rate is controlled, hold warfarin pending GI eval  - INR 2.4 today - give dose of vitamin K today  3. Hypertension  - Hold scheduled antihypertensives in light of acute GI bleeding and treat as-needed only for now    # Anemia Follow iron, b12, folate, ferritin  DVT prophylaxis: SCD Code Status: DNR Family Communication: none at bedside Disposition:   Status is: Observation  The patient will require care spanning > 2 midnights and should be moved to inpatient because: Inpatient level of care appropriate due to severity of illness  Dispo: The patient is from: Home              Anticipated d/c is to: Home              Patient currently is not medically stable to d/c.   Difficult to place patient No       Consultants:   GI  Procedures:  none  Antimicrobials: Anti-infectives (From admission, onward)   None         Subjective: Continued bleeding Mild abdominal discomfort  Objective: Vitals:   06/13/20 1910 06/13/20 2311 06/14/20 0430 06/14/20 1434  BP: (!) 151/82 136/74 133/67 132/87  Pulse: 60 (!) 57 69 62  Resp: 16 18 14 17   Temp:  98.5 F (36.9 C) 98.3 F (36.8 C) 97.9 F (36.6 C) 97.9 F (36.6 C)  TempSrc: Oral Oral Oral Oral  SpO2: 99% 96% 94% 96%  Weight:      Height:        Intake/Output Summary (Last 24 hours) at 06/14/2020 1445 Last data filed at 06/14/2020 1252 Gross per 24 hour  Intake 1166.73 ml  Output 1000 ml  Net 166.73 ml   Filed Weights   06/12/20 1958  Weight: 54.4 kg    Examination:  General: No acute distress. Cardiovascular: Heart sounds show Candace Cain regular rate, and rhythm Lungs: Clear to auscultation bilaterally Abdomen: mild  suprapubic abdominal discomfort  Neurological: Alert and oriented 3. Moves all extremities 4. Cranial nerves II through XII grossly intact. Skin: Warm and dry. No rashes or lesions. Extremities: No clubbing or cyanosis. No edema   Data Reviewed: I have personally reviewed following labs and imaging studies  CBC: Recent Labs  Lab 06/12/20 2021 06/13/20 0044 06/13/20 0844 06/13/20 1620 06/14/20 0023 06/14/20 1245  WBC 6.1  --   --   --  5.9  --   NEUTROABS 4.8  --   --   --  3.8  --   HGB 11.6* 11.5* 10.8* 9.6* 8.0* 8.1*  HCT 36.9 36.0 34.0* 30.2* 25.7* 25.7*  MCV 93.2  --   --   --  94.8  --   PLT 247  --   --   --  203  --     Basic Metabolic Panel: Recent Labs  Lab 06/12/20 2021 06/13/20 0445 06/14/20 0023  NA 137 139 137  K 4.6 3.6 3.6  CL 101 107 108  CO2 28 26 24   GLUCOSE 95 96 90  BUN 29* 24* 23  CREATININE 0.82 0.42* 0.57  CALCIUM 9.3 8.7* 8.3*  MG  --   --  1.8  PHOS  --   --  2.6    GFR: Estimated Creatinine Clearance: 31.1 mL/min (by C-G formula based on SCr of 0.57 mg/dL).  Liver Function Tests: Recent Labs  Lab 06/12/20 2021 06/14/20 0023  AST 29 15  ALT 12 10  ALKPHOS 57 44  BILITOT 0.6 0.7  PROT 7.1 5.5*  ALBUMIN 4.2 3.2*    CBG: No results for input(s): GLUCAP in the last 168 hours.   Recent Results (from the past 240 hour(s))  Resp Panel by RT-PCR (Flu Candace Cain&B, Covid) Nasopharyngeal Swab     Status: None   Collection Time: 06/12/20 10:44 PM   Specimen: Nasopharyngeal Swab; Nasopharyngeal(NP) swabs in vial transport medium  Result Value Ref Range Status   SARS Coronavirus 2 by RT PCR NEGATIVE NEGATIVE Final    Comment: (NOTE) SARS-CoV-2 target nucleic acids are NOT DETECTED.  The SARS-CoV-2 RNA is generally detectable in upper respiratory specimens during the acute phase of infection. The lowest concentration of SARS-CoV-2 viral copies this assay can detect is 138 copies/mL. Candace Cain negative result does not preclude  SARS-Cov-2 infection and should not be used as the sole basis for treatment or other patient management decisions. Candace Cain negative result may occur with  improper specimen collection/handling, submission of specimen other than nasopharyngeal swab, presence of viral mutation(s) within the areas targeted by this assay, and inadequate number of viral copies(<138 copies/mL). Keevan Wolz negative result must be combined with clinical observations, patient history, and epidemiological information. The expected result is Negative.  Fact Sheet for Patients:  08/12/20  Fact Sheet for Healthcare Providers:  BloggerCourse.com  This test is  no t yet approved or cleared by the [place] and  has been authorized for detection and/or diagnosis of SARS-CoV-2 by FDA under an Emergency Use Authorization (EUA). This EUA will remain  in effect (meaning this test can be used) for the duration of the COVID-19 declaration under Section 564(b)(1) of the Act, 21 U.S.C.section 360bbb-3(b)(1), unless the authorization is terminated  or revoked sooner.       Influenza Ozetta Flatley by PCR NEGATIVE NEGATIVE Final   Influenza B by PCR NEGATIVE NEGATIVE Final    Comment: (NOTE) The Xpert Xpress SARS-CoV-2/FLU/RSV plus assay is intended as an aid in the diagnosis of influenza from Nasopharyngeal swab specimens and should not be used as Geovani Tootle sole basis for treatment. Nasal washings and aspirates are unacceptable for Xpert Xpress SARS-CoV-2/FLU/RSV testing.  Fact Sheet for Patients: BloggerCourse.com  Fact Sheet for Healthcare Providers: SeriousBroker.it  This test is not yet approved or cleared by the [place] FDA and has been authorized for detection and/or diagnosis of SARS-CoV-2 by FDA under an Emergency Use Authorization (EUA). This EUA will remain in effect (meaning this test can be used) for the duration of  the COVID-19 declaration under Section 564(b)(1) of the Act, 21 U.S.C. section 360bbb-3(b)(1), unless the authorization is terminated or revoked.  Performed at [facility], [address] [place], [place] [address]          Radiology Studies: No results found.      Scheduled Meds: . hydrocortisone  25 mg Rectal BID   Continuous Infusions:    LOS: 1 day    Time spent: over 30 min    Lacretia Nicks, MD [facility]   To contact the attending provider between 7A-7P or the covering provider during after hours 7P-7A, please log into the web site [email or web address] and access using universal [place] password for that web site. If you do not have the password, please call the hospital operator.  06/14/2020, 2:45 PM

## 2020-06-15 LAB — HEMOGLOBIN AND HEMATOCRIT, BLOOD
HCT: 23.5 % — ABNORMAL LOW (ref 36.0–46.0)
HCT: 23.5 % — ABNORMAL LOW (ref 36.0–46.0)
HCT: 27.7 % — ABNORMAL LOW (ref 36.0–46.0)
Hemoglobin: 7.3 g/dL — ABNORMAL LOW (ref 12.0–15.0)
Hemoglobin: 7.3 g/dL — ABNORMAL LOW (ref 12.0–15.0)
Hemoglobin: 8.7 g/dL — ABNORMAL LOW (ref 12.0–15.0)

## 2020-06-15 LAB — COMPREHENSIVE METABOLIC PANEL
ALT: 10 U/L (ref 0–44)
AST: 14 U/L — ABNORMAL LOW (ref 15–41)
Albumin: 3.3 g/dL — ABNORMAL LOW (ref 3.5–5.0)
Alkaline Phosphatase: 44 U/L (ref 38–126)
Anion gap: 5 (ref 5–15)
BUN: 32 mg/dL — ABNORMAL HIGH (ref 8–23)
CO2: 25 mmol/L (ref 22–32)
Calcium: 8.6 mg/dL — ABNORMAL LOW (ref 8.9–10.3)
Chloride: 110 mmol/L (ref 98–111)
Creatinine, Ser: 0.66 mg/dL (ref 0.44–1.00)
GFR, Estimated: >60 mL/min (ref >60)
Glucose, Bld: 90 mg/dL (ref 70–99)
Potassium: 3.5 mmol/L (ref 3.5–5.1)
Sodium: 140 mmol/L (ref 135–145)
Total Bilirubin: 0.5 mg/dL (ref 0.3–1.2)
Total Protein: 5.5 g/dL — ABNORMAL LOW (ref 6.5–8.1)

## 2020-06-15 LAB — CBC WITH DIFFERENTIAL/PLATELET
Abs Immature Granulocytes: 0.01 10*3/uL (ref 0.00–0.07)
Basophils Absolute: 0 10*3/uL (ref 0.0–0.1)
Basophils Relative: 0 %
Eosinophils Absolute: 0.2 10*3/uL (ref 0.0–0.5)
Eosinophils Relative: 4 %
HCT: 24.1 % — ABNORMAL LOW (ref 36.0–46.0)
Hemoglobin: 7.4 g/dL — ABNORMAL LOW (ref 12.0–15.0)
Immature Granulocytes: 0 %
Lymphocytes Relative: 19 %
Lymphs Abs: 1 10*3/uL (ref 0.7–4.0)
MCH: 29.6 pg (ref 26.0–34.0)
MCHC: 30.7 g/dL (ref 30.0–36.0)
MCV: 96.4 fL (ref 80.0–100.0)
Monocytes Absolute: 0.5 10*3/uL (ref 0.1–1.0)
Monocytes Relative: 11 %
Neutro Abs: 3.3 10*3/uL (ref 1.7–7.7)
Neutrophils Relative %: 66 %
Platelets: 207 10*3/uL (ref 150–400)
RBC: 2.5 MIL/uL — ABNORMAL LOW (ref 3.87–5.11)
RDW: 15 % (ref 11.5–15.5)
WBC: 5.1 10*3/uL (ref 4.0–10.5)
nRBC: 0 % (ref 0.0–0.2)

## 2020-06-15 LAB — VITAMIN B12: Vitamin B-12: 1060 pg/mL — ABNORMAL HIGH (ref 180–914)

## 2020-06-15 LAB — PROTIME-INR
INR: 1.2 (ref 0.8–1.2)
Prothrombin Time: 14.7 seconds (ref 11.4–15.2)

## 2020-06-15 LAB — IRON AND TIBC
Iron: 49 ug/dL (ref 28–170)
Saturation Ratios: 18 % (ref 10.4–31.8)
TIBC: 275 ug/dL (ref 250–450)
UIBC: 226 ug/dL

## 2020-06-15 LAB — PHOSPHORUS: Phosphorus: 2.7 mg/dL (ref 2.5–4.6)

## 2020-06-15 LAB — FOLATE: Folate: 8.2 ng/mL (ref >5.9)

## 2020-06-15 LAB — MAGNESIUM: Magnesium: 1.8 mg/dL (ref 1.7–2.4)

## 2020-06-15 LAB — FERRITIN: Ferritin: 25 ng/mL (ref 11–307)

## 2020-06-15 NOTE — Progress Notes (Signed)
PROGRESS NOTE    Candace Cain  QPR:[id number] DOB: 10/23/1920 DOA: 06/12/2020 PCP: Daisy Floro, MD   Chief Complaint  Patient presents with  . Rectal Bleeding   Brief Narrative:  Candace Cain is Candace Cain 87 y.o. female with medical history significant for atrial fibrillation on Coumadin,, hypothyroidism, perforated diverticulitis status post sigmoid colectomy and colostomy in 2017, now presenting to the emergency department red blood per rectum.  Patient reports that she was in her usual state and having an uneventful day until approximately 6 PM when she reports passing Candace Cain large volume of bright red blood on the toilet.  She has not seen any blood in her colostomy bag, has not had any abdominal or rectal pain, denies trauma, and has never experienced this before.  Family at bedside notes that they saw some red blood clots.  ED Course: Upon arrival to the ED, patient is found to be afebrile, saturating well on room air, and with normal heart rate and blood pressure.  She had some red blood in the rectum in the emergency department but did not appear to have active bleeding.  Panel notable for hemoglobin 11.6.  INR 2.2.  Given IV fluids in the ED, type and screen was performed, ED PA requested GI consultation, and hospitalists asked to admit.  Assessment & Plan:   Principal Problem:   Rectal bleeding Active Problems:   Essential hypertension   Atrial fibrillation (HCC)   Hypothyroidism   Diverticulitis of colon with perforation s/p colectomy/colostomy 12/25/2015  1. Rectal bleeding  Anemia  - Patient on warfarin for [name] fib and status post sigmoid colectomy and colostomy in 2017 presents with bright red blood per rectum  - Initial Hgb 11.6  - Hb relatively stable, follow - INR 1.2 today, follow  - Repeat Hb, trend - transfuse for symptomatic or Hb <7 - discussed risks/benefits, she's agreeable - GI c/s, appreciate recommendations - recommending topical antihemorrhoidal therapies, if  bleeding persists when INR <2, consider sigmoidoscopy per rectum to assess for diversion colitis or other causes of bleeding.  If pain/bleeding worsens, consider CT abd/pelvis.  Hold anticoagulation.  2. Atrial fibrillation  - Rate is controlled, hold warfarin pending GI eval  - INR 1.2 today - s/p vitamin K 6/5  3. Hypertension  - Hold scheduled antihypertensives in light of acute GI bleeding and treat as-needed only for now    # Anemia Follow iron, b12, folate, ferritin - labs c/w AOCD  DVT prophylaxis: SCD Code Status: DNR Family Communication: none at bedside Disposition:   Status is: Observation  The patient will require care spanning > 2 midnights and should be moved to inpatient because: Inpatient level of care appropriate due to severity of illness  Dispo: The patient is from: Home              Anticipated d/c is to: Home              Patient currently is not medically stable to d/c.   Difficult to place patient No       Consultants:   GI  Procedures:  none  Antimicrobials: Anti-infectives (From admission, onward)   None         Subjective: She's not sure when she last had bleeding She thinks last she noticed was yesterday  Objective: Vitals:   06/14/20 1434 06/14/20 2007 06/15/20 0513 06/15/20 1410  BP: 132/87 129/84 (!) 144/59 138/78  Pulse: 62 72 (!) 56 (!) 59  Resp: 17 18 17  16  Temp: 97.9 F (36.6 C) 98.4 F (36.9 C) 97.7 F (36.5 C) 98.9 F (37.2 C)  TempSrc: Oral Oral Oral Oral  SpO2: 96% 94% 98% 96%  Weight:      Height:        Intake/Output Summary (Last 24 hours) at 06/15/2020 1600 Last data filed at 06/14/2020 1803 Gross per 24 hour  Intake --  Output 350 ml  Net -350 ml   Filed Weights   06/12/20 1958  Weight: 54.4 kg    Examination:  General: No acute distress. Cardiovascular: Heart sounds show [name] regular rate, and rhythm. Lungs: Clear to auscultation bilaterally  Abdomen: Soft, nontender, nondistended - brown stool  in ostomy Neurological: Alert and oriented 3. Moves all extremities 4. Cranial nerves II through XII grossly intact. Skin: Warm and dry. No rashes or lesions. Extremities: No clubbing or cyanosis. No edema.    Data Reviewed: I have personally reviewed following labs and imaging studies  CBC: Recent Labs  Lab 06/12/20 2021 06/13/20 0044 06/14/20 0023 06/14/20 1245 06/14/20 2154 06/15/20 0648 06/15/20 1301  WBC 6.1  --  5.9  --   --  5.1  --   NEUTROABS 4.8  --  3.8  --   --  3.3  --   HGB 11.6*   < > 8.0* 8.1* 7.3* 7.4* 7.3*  HCT 36.9   < > 25.7* 25.7* 23.5* 24.1* 23.5*  MCV 93.2  --  94.8  --   --  96.4  --   PLT 247  --  203  --   --  207  --    < > = values in this interval not displayed.    Basic Metabolic Panel: Recent Labs  Lab 06/12/20 2021 06/13/20 0445 06/14/20 0023 06/15/20 0648  NA 137 139 137 140  K 4.6 3.6 3.6 3.5  CL 101 107 108 110  CO2 28 26 24 25   GLUCOSE 95 96 90 90  BUN 29* 24* 23 32*  CREATININE 0.82 0.42* 0.57 0.66  CALCIUM 9.3 8.7* 8.3* 8.6*  MG  --   --  1.8 1.8  PHOS  --   --  2.6 2.7    GFR: Estimated Creatinine Clearance: 31.1 mL/min (by C-G formula based on SCr of 0.66 mg/dL).  Liver Function Tests: Recent Labs  Lab 06/12/20 2021 06/14/20 0023 06/15/20 0648  AST 29 15 14*  ALT 12 10 10   ALKPHOS 57 44 44  BILITOT 0.6 0.7 0.5  PROT 7.1 5.5* 5.5*  ALBUMIN 4.2 3.2* 3.3*    CBG: No results for input(s): GLUCAP in the last 168 hours.   Recent Results (from the past 240 hour(s))  Resp Panel by RT-PCR (Flu Jeremy Ditullio&B, Covid) Nasopharyngeal Swab     Status: None   Collection Time: 06/12/20 10:44 PM   Specimen: Nasopharyngeal Swab; Nasopharyngeal(NP) swabs in vial transport medium  Result Value Ref Range Status   SARS Coronavirus 2 by RT PCR NEGATIVE NEGATIVE Final    Comment: (NOTE) SARS-CoV-2 target nucleic acids are NOT DETECTED.  The SARS-CoV-2 RNA is generally detectable in upper respiratory specimens during the acute phase  of infection. The lowest concentration of SARS-CoV-2 viral copies this assay can detect is 138 copies/mL. Clytee Heinrich negative result does not preclude SARS-Cov-2 infection and should not be used as the sole basis for treatment or other patient management decisions. Caswell Alvillar negative result may occur with  improper specimen collection/handling, submission of specimen other than nasopharyngeal swab, presence of viral mutation(s) within the  areas targeted by this assay, and inadequate number of viral copies(<138 copies/mL). Letita Prentiss negative result must be combined with clinical observations, patient history, and epidemiological information. The expected result is Negative.  Fact Sheet for Patients:  BloggerCourse.com  Fact Sheet for Healthcare Providers:  SeriousBroker.it  This test is no t yet approved or cleared by the [place] FDA and  has been authorized for detection and/or diagnosis of SARS-CoV-2 by FDA under an Emergency Use Authorization (EUA). This EUA will remain  in effect (meaning this test can be used) for the duration of the COVID-19 declaration under Section 564(b)(1) of the Act, 21 U.S.C.section 360bbb-3(b)(1), unless the authorization is terminated  or revoked sooner.       Influenza Catia Todorov by PCR NEGATIVE NEGATIVE Final   Influenza B by PCR NEGATIVE NEGATIVE Final    Comment: (NOTE) The Xpert Xpress SARS-CoV-2/FLU/RSV plus assay is intended as an aid in the diagnosis of influenza from Nasopharyngeal swab specimens and should not be used as Misti Towle sole basis for treatment. Nasal washings and aspirates are unacceptable for Xpert Xpress SARS-CoV-2/FLU/RSV testing.  Fact Sheet for Patients: BloggerCourse.com  Fact Sheet for Healthcare Providers: SeriousBroker.it  This test is not yet approved or cleared by the [place] FDA and has been authorized for detection and/or diagnosis of  SARS-CoV-2 by FDA under an Emergency Use Authorization (EUA). This EUA will remain in effect (meaning this test can be used) for the duration of the COVID-19 declaration under Section 564(b)(1) of the Act, 21 U.S.C. section 360bbb-3(b)(1), unless the authorization is terminated or revoked.  Performed at [facility], [address], [place], [place] [address]          Radiology Studies: No results found.      Scheduled Meds:  Continuous Infusions:    LOS: 2 days    Time spent: over 30 min    Lacretia Nicks, MD [facility]   To contact the attending provider between 7A-7P or the covering provider during after hours 7P-7A, please log into the web site [email or web address] and access using universal  [place] password for that web site. If you do not have the password, please call the hospital operator.  06/15/2020, 4:00 PM

## 2020-06-15 NOTE — Consult Note (Signed)
WOC Nurse ostomy follow up Patient receiving care in [facility]12. Stoma type/location: LUQ colostomy Stomal assessment/size: 1.5 inches, round, pale pink, moist Peristomal assessment: deferred, existing pouch placed yesterday Treatment options for stomal/peristomal skin: patient explains she does not use paste or barrier ring Output: dark stool and much flatus in pouch. Pouch burped of flatus. Ostomy pouching: 2pc. 2 3/4 inches. Pouch Hart [place] [phone]; skin barrier Hart [place] #2 Education provided: none. This is a long term ostomy that the patient normally provides care for. WOC nurse will not follow at this time.  Please re-consult the WOC team if needed.  Helmut Muster, RN, MSN, CWOCN, CNS-BC, pager [phone]

## 2020-06-15 NOTE — Progress Notes (Addendum)
[facility] Progress Note  Candace Cain 87 y.o. 02/28/1920  CC:  Rectal bleeding   Subjective: Patient with history of post sigmoid colectomy and colostomy in 2017 for perforated diverticulitis, with atrial fibrillation on Coumadin.  This was held and she was given vitamin K, INR today is at 1.2. Patient denies any nausea, vomiting, tolerated food well last night. Patient unable to answer if she has had any blood in the stool overnight, talked with nurse and statess he did not have any blood last night per rectum. Ostomy bag without blood.   ROS : Review of Systems  Constitutional: Negative for chills and fever.  HENT: Negative for hearing loss.   Respiratory: Negative for shortness of breath.   Cardiovascular: Negative for chest pain.  Gastrointestinal: Negative for abdominal pain and nausea.    Objective: Vital signs in last 24 hours: Vitals:   06/14/20 2007 06/15/20 0513  BP: 129/84 (!) 144/59  Pulse: 72 (!) 56  Resp: 18 17  Temp: 98.4 F (36.9 C) 97.7 F (36.5 C)  SpO2: 94% 98%    Physical Exam: Physical Exam Constitutional:      Comments: Thin appearing female younger than stated age in no acute distress.  HENT:     Head: Normocephalic.  Eyes:     Conjunctiva/sclera: Conjunctivae normal.  Abdominal:     Comments: Left lower quadrant colostomy with brown stool, no blood.  Umbilical hernia, abdomen soft, nontender.  Musculoskeletal:        General: No swelling.  Skin:    General: Skin is warm.  Neurological:     General: No focal deficit present.     Mental Status: Mental status is at baseline.  Psychiatric:        Mood and Affect: Mood normal.      Lab Results: Recent Labs    06/14/20 0023 06/15/20 0648  NA 137 140  K 3.6 3.5  CL 108 110  CO2 24 25  GLUCOSE 90 90  BUN 23 32*  CREATININE 0.57 0.66  CALCIUM 8.3* 8.6*  MG 1.8 1.8  PHOS 2.6 2.7   Recent Labs    06/14/20 0023 06/15/20 0648  AST 15 14*  ALT 10 10  ALKPHOS 44 44   BILITOT 0.7 0.5  PROT 5.5* 5.5*  ALBUMIN 3.2* 3.3*   Recent Labs    06/14/20 0023 06/14/20 1245 06/15/20 0648  WBC 5.9  --  5.1  NEUTROABS 3.8  --  3.3  HGB 8.0* 8.1* 7.4*  HCT 25.7* 25.7* 24.1*  MCV 94.8  --  96.4  PLT 203  --  207   Recent Labs    06/14/20 0023 06/15/20 0648  LABPROT 26.8* 14.7  INR 2.5* 1.2    Lab Results: Results for orders placed or performed during the hospital encounter of 06/12/20 (from the past 48 hour(s))  Hemoglobin     Status: Abnormal   Collection Time: 06/13/20  4:20 PM  Result Value Ref Range   Hemoglobin 9.6 (L) 12.0 - 15.0 g/dL    Comment: Performed at [facility], [address] [place], [place] [address]  Hematocrit     Status: Abnormal   Collection Time: 06/13/20  4:20 PM  Result Value Ref Range   HCT 30.2 (L) 36.0 - 46.0 %    Comment: Performed at [facility], [address], [place], [place] [address]  Protime-INR     Status: Abnormal   Collection Time: 06/14/20 12:23 AM  Result Value  Ref Range   Prothrombin Time 26.8 (H) 11.4 - 15.2 seconds   INR 2.5 (H) 0.8 - 1.2    Comment: (NOTE) INR goal varies based on device and disease states. Performed at [facility], [address], [place], [place] [address]   CBC with Differential/Platelet     Status: Abnormal   Collection Time: 06/14/20 12:23 AM  Result Value Ref Range   WBC 5.9 4.0 - 10.5 K/uL   RBC 2.71 (L) 3.87 - 5.11 MIL/uL   Hemoglobin 8.0 (L) 12.0 - 15.0 g/dL   HCT 37.8 (L) 58.8 - 50.2 %   MCV 94.8 80.0 - 100.0 fL   MCH 29.5 26.0 - 34.0 pg   MCHC 31.1 30.0 - 36.0 g/dL   RDW 77.4 12.8 - 78.6 %   Platelets 203 150 - 400 K/uL   nRBC 0.0 0.0 - 0.2 %   Neutrophils Relative % 66 %   Neutro Abs 3.8 1.7 - 7.7 K/uL   Lymphocytes Relative 23 %   Lymphs Abs 1.4 0.7 - 4.0 K/uL   Monocytes Relative 8 %   Monocytes Absolute 0.5 0.1 - 1.0 K/uL   Eosinophils Relative 3 %   Eosinophils Absolute 0.2 0.0 - 0.5 K/uL    Basophils Relative 0 %   Basophils Absolute 0.0 0.0 - 0.1 K/uL   Immature Granulocytes 0 %   Abs Immature Granulocytes 0.01 0.00 - 0.07 K/uL    Comment: Performed at [facility], [address] [place], [place] [address]  Comprehensive metabolic panel     Status: Abnormal   Collection Time: 06/14/20 12:23 AM  Result Value Ref Range   Sodium 137 135 - 145 mmol/L   Potassium 3.6 3.5 - 5.1 mmol/L   Chloride 108 98 - 111 mmol/L   CO2 24 22 - 32 mmol/L   Glucose, Bld 90 70 - 99 mg/dL    Comment: Glucose reference range applies only to samples taken after fasting for at least 8 hours.   BUN 23 8 - 23 mg/dL   Creatinine, Ser 9.47 0.44 - 1.00 mg/dL   Calcium 8.3 (L) 8.9 - 10.3 mg/dL   Total Protein 5.5 (L) 6.5 - 8.1 g/dL   Albumin 3.2 (L) 3.5 - 5.0 g/dL   AST 15 15 - 41 U/L   ALT 10 0 - 44 U/L   Alkaline Phosphatase 44 38 - 126 U/L   Total Bilirubin 0.7 0.3 - 1.2 mg/dL   GFR, Estimated >09 >62 mL/min    Comment: (NOTE) Calculated using the CKD-EPI Creatinine Equation (2021)    Anion gap 5 5 - 15    Comment: Performed at [facility], [address] [place], [place] [address]  Magnesium     Status: None   Collection Time: 06/14/20 12:23 AM  Result Value Ref Range   Magnesium 1.8 1.7 - 2.4 mg/dL    Comment: Performed at [facility], [address] [place], [place] [address]  Phosphorus     Status: None   Collection Time: 06/14/20 12:23 AM  Result Value Ref Range   Phosphorus 2.6 2.5 - 4.6 mg/dL    Comment: Performed at [facility], [address], [place], [place] [address]  Hemoglobin and hematocrit, blood     Status: Abnormal   Collection Time: 06/14/20 12:45 PM  Result Value Ref Range   Hemoglobin 8.1 (L) 12.0 - 15.0 g/dL   HCT 54.6 (L) 56.8 - 12.7 %  Comment: Performed at [facility], [address] [place], [place] [address]  Protime-INR     Status: None   Collection Time:  06/15/20  6:48 AM  Result Value Ref Range   Prothrombin Time 14.7 11.4 - 15.2 seconds   INR 1.2 0.8 - 1.2    Comment: (NOTE) INR goal varies based on device and disease states. Performed at [facility], [address] [place], [place] [address]   Folate     Status: None   Collection Time: 06/15/20  6:48 AM  Result Value Ref Range   Folate 8.2 >5.9 ng/mL    Comment: Performed at [facility], [address] [place], [place] [address]  Iron and TIBC     Status: None   Collection Time: 06/15/20  6:48 AM  Result Value Ref Range   Iron 49 28 - 170 ug/dL   TIBC 914275 782250 - 956450 ug/dL   Saturation Ratios 18 10.4 - 31.8 %   UIBC 226 ug/dL    Comment: Performed at [facility], [address] [place], [place] [address]  Ferritin     Status: None   Collection Time: 06/15/20  6:48 AM  Result Value Ref Range   Ferritin 25 11 - 307 ng/mL    Comment: Performed at [facility], [address] [place], [place] [address]  Vitamin B12     Status: Abnormal   Collection Time: 06/15/20  6:48 AM  Result Value Ref Range   Vitamin B-12 1,060 (H) 180 - 914 pg/mL    Comment: (NOTE) This assay is not validated for testing neonatal or myeloproliferative syndrome specimens for Vitamin B12 levels. Performed at [facility], [address] [place], [place] [address]   CBC with Differential/Platelet     Status: Abnormal   Collection Time: 06/15/20  6:48 AM  Result Value Ref Range   WBC 5.1 4.0 - 10.5 K/uL   RBC 2.50 (L) 3.87 - 5.11 MIL/uL   Hemoglobin 7.4 (L) 12.0 - 15.0 g/dL   HCT 52.824.1 (L) 41.336.0 - 24.446.0 %   MCV 96.4 80.0 - 100.0 fL   MCH 29.6 26.0 - 34.0 pg   MCHC 30.7 30.0 - 36.0 g/dL   RDW 01.015.0 27.211.5 - 53.615.5 %   Platelets 207 150 - 400 K/uL   nRBC 0.0 0.0 - 0.2 %   Neutrophils Relative % 66 %   Neutro Abs 3.3 1.7 - 7.7 K/uL   Lymphocytes Relative 19 %   Lymphs Abs 1.0 0.7 - 4.0 K/uL   Monocytes  Relative 11 %   Monocytes Absolute 0.5 0.1 - 1.0 K/uL   Eosinophils Relative 4 %   Eosinophils Absolute 0.2 0.0 - 0.5 K/uL   Basophils Relative 0 %   Basophils Absolute 0.0 0.0 - 0.1 K/uL   Immature Granulocytes 0 %   Abs Immature Granulocytes 0.01 0.00 - 0.07 K/uL    Comment: Performed at [facility], [address] [place], [place] [address]  Comprehensive metabolic panel     Status: Abnormal   Collection Time: 06/15/20  6:48 AM  Result Value Ref Range   Sodium 140 135 - 145 mmol/L   Potassium 3.5 3.5 - 5.1 mmol/L   Chloride 110 98 - 111 mmol/L   CO2 25 22 - 32 mmol/L   Glucose, Bld 90 70 - 99 mg/dL    Comment: Glucose reference range applies only to samples taken after  fasting for at least 8 hours.   BUN 32 (H) 8 - 23 mg/dL   Creatinine, Ser 8.65 0.44 - 1.00 mg/dL   Calcium 8.6 (L) 8.9 - 10.3 mg/dL   Total Protein 5.5 (L) 6.5 - 8.1 g/dL   Albumin 3.3 (L) 3.5 - 5.0 g/dL   AST 14 (L) 15 - 41 U/L   ALT 10 0 - 44 U/L   Alkaline Phosphatase 44 38 - 126 U/L   Total Bilirubin 0.5 0.3 - 1.2 mg/dL   GFR, Estimated >78 >46 mL/min    Comment: (NOTE) Calculated using the CKD-EPI Creatinine Equation (2021)    Anion gap 5 5 - 15    Comment: Performed at [facility], [address], [place], [place] [address]  Magnesium     Status: None   Collection Time: 06/15/20  6:48 AM  Result Value Ref Range   Magnesium 1.8 1.7 - 2.4 mg/dL    Comment: Performed at [facility], [address] [place], [place] [address]  Phosphorus     Status: None   Collection Time: 06/15/20  6:48 AM  Result Value Ref Range   Phosphorus 2.7 2.5 - 4.6 mg/dL    Comment: Performed at [facility], [address], [place], [place] [address]    Studies/Results: No results found.  Assessment: Rectal bleeding in patient with previous sigmoid colectomy due to diverticulitis with colostomy left lower quadrant. CBC today hemoglobin 7.4 (  8.1, 8, 9.6) Atrial fibrillation  Plan: Continue suppositories, continue supportive care transfuse as needed below 7, serial CBC's INR currently less than 2, will monitor today for rectal bleeding and CBC, if any further bleeding or worsening CBC-will do sigmoidoscopy per rectum to assess for diversion colitis or other etiologies.   Doree Albee PA-C 06/15/2020, 9:12 AM  Contact #  [phone]      Patient seen and examined and case discussed with my partner Dr. Dulce Sellar and our PA and I discussed her case with the patient and her daughter as well and she is having some loose stools in her ostomy and we discussed her diet and the risks and benefits of flex sig was discussed and we will decide tomorrow if we should proceed or not and the question of restarting her blood thinner versus taking chances without it with her A. fib will need to be decided

## 2020-06-16 ENCOUNTER — Inpatient Hospital Stay (HOSPITAL_COMMUNITY): Payer: Medicare Other

## 2020-06-16 LAB — COMPREHENSIVE METABOLIC PANEL
ALT: 10 U/L (ref 0–44)
AST: 15 U/L (ref 15–41)
Albumin: 3 g/dL — ABNORMAL LOW (ref 3.5–5.0)
Alkaline Phosphatase: 43 U/L (ref 38–126)
Anion gap: 7 (ref 5–15)
BUN: 23 mg/dL (ref 8–23)
CO2: 22 mmol/L (ref 22–32)
Calcium: 8.6 mg/dL — ABNORMAL LOW (ref 8.9–10.3)
Chloride: 108 mmol/L (ref 98–111)
Creatinine, Ser: 0.71 mg/dL (ref 0.44–1.00)
GFR, Estimated: >60 mL/min (ref >60)
Glucose, Bld: 96 mg/dL (ref 70–99)
Potassium: 3.6 mmol/L (ref 3.5–5.1)
Sodium: 137 mmol/L (ref 135–145)
Total Bilirubin: 0.6 mg/dL (ref 0.3–1.2)
Total Protein: 5.2 g/dL — ABNORMAL LOW (ref 6.5–8.1)

## 2020-06-16 LAB — HEMOGLOBIN AND HEMATOCRIT, BLOOD
HCT: 28.7 % — ABNORMAL LOW (ref 36.0–46.0)
HCT: 25 % — ABNORMAL LOW (ref 36.0–46.0)
Hemoglobin: 8.4 g/dL — ABNORMAL LOW (ref 12.0–15.0)
Hemoglobin: 7.7 g/dL — ABNORMAL LOW (ref 12.0–15.0)

## 2020-06-16 LAB — CBC WITH DIFFERENTIAL/PLATELET
Abs Immature Granulocytes: 0.01 10*3/uL (ref 0.00–0.07)
Basophils Absolute: 0 10*3/uL (ref 0.0–0.1)
Basophils Relative: 1 %
Eosinophils Absolute: 0.2 10*3/uL (ref 0.0–0.5)
Eosinophils Relative: 4 %
HCT: 24.9 % — ABNORMAL LOW (ref 36.0–46.0)
Hemoglobin: 7.5 g/dL — ABNORMAL LOW (ref 12.0–15.0)
Immature Granulocytes: 0 %
Lymphocytes Relative: 19 %
Lymphs Abs: 1.2 10*3/uL (ref 0.7–4.0)
MCH: 29.6 pg (ref 26.0–34.0)
MCHC: 30.1 g/dL (ref 30.0–36.0)
MCV: 98.4 fL (ref 80.0–100.0)
Monocytes Absolute: 0.8 10*3/uL (ref 0.1–1.0)
Monocytes Relative: 13 %
Neutro Abs: 3.9 10*3/uL (ref 1.7–7.7)
Neutrophils Relative %: 63 %
Platelets: 210 10*3/uL (ref 150–400)
RBC: 2.53 MIL/uL — ABNORMAL LOW (ref 3.87–5.11)
RDW: 15.2 % (ref 11.5–15.5)
WBC: 6.1 10*3/uL (ref 4.0–10.5)
nRBC: 0 % (ref 0.0–0.2)

## 2020-06-16 LAB — MAGNESIUM: Magnesium: 1.9 mg/dL (ref 1.7–2.4)

## 2020-06-16 LAB — PHOSPHORUS: Phosphorus: 2.9 mg/dL (ref 2.5–4.6)

## 2020-06-16 MED ORDER — IOHEXOL 9 MG/ML PO SOLN
ORAL | Status: AC
  Filled 2020-06-16: qty 1000

## 2020-06-16 MED ORDER — IOHEXOL 9 MG/ML PO SOLN
500.0000 mL | ORAL | Status: AC
  Administered 2020-06-16 (×2): 500 mL via ORAL

## 2020-06-16 NOTE — Evaluation (Signed)
Physical Therapy Evaluation Patient Details Name: Candace Cain MRN: [id number] DOB: [birth year] Today's Date: 06/16/2020   History of Present Illness  87 yo female admitted with rectal bleeding, abd pain. Hx of PAF, R hip hemiarthroplasty 2016, bradycardia, hypothyroidism, colectomy 2017  Clinical Impression  On eval, pt required Min-Mod A for mobility. She was able to stand and take a few side steps along the side of the bed with use of a RW. Pt presents with general weakness, and impaired gait and balance. She is from [employer] and was Mod Ind at baseline. At this time, recommendation is for ST SNF for rehab if pt/family are agreeable. Will plan to follow and progress activity as tolerated. Will also consult OT during this hospital stay.     Follow Up Recommendations SNF (unless pt/family decline placement and prefer home. If returns home, then HHPT)    Equipment Recommendations  None recommended by PT    Recommendations for Other Services       Precautions / Restrictions Precautions Precautions: Fall Restrictions Weight Bearing Restrictions: No      Mobility  Bed Mobility Overal bed mobility: Needs Assistance Bed Mobility: Supine to Sit;Sit to Supine     Supine to sit: Min assist Sit to supine: Min guard   General bed mobility comments: Assist for LEs and trunk. Increased time. Cues required.    Transfers Overall transfer level: Needs assistance Equipment used: Rolling walker (2 wheeled) Transfers: Sit to/from Stand Sit to Stand: Mod assist;From elevated surface         General transfer comment: Assist to power up, steady, control descent. Cues for safety, technique, hand placement. Increased time. Bladder incontinence  Ambulation/Gait Ambulation/Gait assistance: Min assist   Assistive device: Rolling walker (2 wheeled)       General Gait Details: side steps along side of bed with RW. Assist to steady pt and manage RW.  Stairs             Wheelchair Mobility    Modified Rankin (Stroke Patients Only)       Balance Overall balance assessment: Needs assistance;History of Falls         Standing balance support: Bilateral upper extremity supported Standing balance-Leahy Scale: Poor                               Pertinent Vitals/Pain Pain Assessment: No/denies pain    Home Living Family/patient expects to be discharged to:: Unsure Living Arrangements: Alone   Type of Home: Independent living facility Home Access: Level entry     Home Layout: One level Home Equipment: Walker - 4 wheels;Cane - single point;Toilet riser;Grab bars - toilet;Grab bars - tub/shower      Prior Function Level of Independence: Independent with assistive device(s)         Comments: uses rollator for ambulation. performs ADL Mod Ind per pt     Hand Dominance        Extremity/Trunk Assessment   Upper Extremity Assessment Upper Extremity Assessment: Defer to OT evaluation    Lower Extremity Assessment Lower Extremity Assessment: Generalized weakness    Cervical / Trunk Assessment Cervical / Trunk Assessment: Kyphotic  Communication   Communication: HOH  Cognition Arousal/Alertness: Awake/alert Behavior During Therapy: WFL for tasks assessed/performed Overall Cognitive Status: Within Functional Limits for tasks assessed  General Comments      Exercises     Assessment/Plan    PT Assessment    PT Problem List         PT Treatment Interventions      PT Goals (Current goals can be found in the Care Plan section)  Acute Rehab PT Goals Patient Stated Goal: back to Ind Living PT Goal Formulation: With patient/family Time For Goal Achievement: 06/30/20 Potential to Achieve Goals: Good    Frequency     Barriers to discharge        Co-evaluation               AM-PAC PT "6 Clicks" Mobility  Outcome Measure Help needed turning  from your back to your side while in a flat bed without using bedrails?: A Little Help needed moving from lying on your back to sitting on the side of a flat bed without using bedrails?: A Little Help needed moving to and from a bed to a chair (including a wheelchair)?: A Little Help needed standing up from a chair using your arms (e.g., wheelchair or bedside chair)?: A Little Help needed to walk in hospital room?: A Little Help needed climbing 3-5 steps with a railing? : A Lot 6 Click Score: 17    End of Session   Activity Tolerance: Patient tolerated treatment well Patient left: in bed;with call bell/phone within reach;with family/visitor present (with NT finishing closing out room/putting on purewick)   PT Visit Diagnosis: Muscle weakness (generalized) (M62.81);History of falling (Z91.81);Unsteadiness on feet (R26.81)    Time: 3612-2449 PT Time Calculation (min) (ACUTE ONLY): 25 min   Charges:   PT Evaluation $PT Eval Moderate Complexity: 1 Mod PT Treatments $Gait Training: 8-22 mins          Faye Ramsay, PT Acute Rehabilitation  Office: [phone] Pager: [phone]

## 2020-06-16 NOTE — Progress Notes (Signed)
PT Cancellation Note  Patient Details Name: Candace Cain MRN: [id number] DOB: 17-May-1920   Cancelled Treatment:    Reason Eval/Treat Not Completed: Patient at procedure or test/unavailable. Per family, pt awaiting transport for CT of her abdomen. Will hold PT at this time. Will check back as schedule allows.    Faye Ramsay, PT Acute Rehabilitation  Office: [phone] Pager: [phone]

## 2020-06-16 NOTE — Care Management Important Message (Signed)
Important Message  Patient Details IM Letter placed in Patient's room for daughter Fanny Skates. Name: Eyva Califano MRN: [id number] Date of Birth: [birth year]   Medicare Important Message Given:  Yes     Caren Macadam 06/16/2020, 12:02 PM

## 2020-06-16 NOTE — Progress Notes (Signed)
[facility] Progress Note  Candace Cain 87 y.o. 1920/02/12  CC:  Rectal bleeding   Subjective: Patient with history of post sigmoid colectomy and colostomy in 2017 for perforated diverticulitis, with atrial fibrillation on Coumadin.  This was held and she was given vitamin K, INR today is at 1.2.  Patient is complaining of suprapubic and right lower quadrant pain since yesterday, has gotten progressively worse.  Patient has wick that is collecting urine and any blood, this was emptied at 5:00 with 300 cc amber fluid according to the nurse.  In the room currently small volume amber appearing urine. Patient has soft brown stool without any blood in colostomy bag. Patient denies any nausea, vomiting, chills fever.   Tolerated food well last night  Lab Results  Component Value Date   INR 1.2 06/15/2020   INR 2.5 (H) 06/14/2020   INR 2.4 (H) 06/13/2020     ROS : Review of Systems  Constitutional: Negative for chills and fever.  HENT: Positive for hearing loss.   Respiratory: Negative for shortness of breath.   Cardiovascular: Negative for chest pain.  Gastrointestinal: Positive for abdominal pain. Negative for constipation, diarrhea, nausea and vomiting.  Neurological: Negative for loss of consciousness.    Objective: Vital signs in last 24 hours: Vitals:   06/15/20 2044 06/16/20 0451  BP: (!) 147/72 (!) 141/65  Pulse: 75 (!) 58  Resp: 18 16  Temp: 99.1 F (37.3 C) 98 F (36.7 C)  SpO2: 98% 97%    Physical Exam: Physical Exam Constitutional:      Comments: Thin appearing female younger than stated age in no acute distress.  HENT:     Head: Normocephalic.  Eyes:     Conjunctiva/sclera: Conjunctivae normal.  Abdominal:     Comments: Left lower quadrant colostomy with brown stool, no blood. Umbilical hernia, abdomen soft, some suprapubic and right lower quadrant tenderness, no rebound, some fullness.   Musculoskeletal:        General: No swelling.  Skin:     General: Skin is warm.  Neurological:     General: No focal deficit present.     Mental Status: Mental status is at baseline.  Psychiatric:        Mood and Affect: Mood normal.      Lab Results: Recent Labs    06/15/20 0648 06/16/20 0525  NA 140 137  K 3.5 3.6  CL 110 108  CO2 25 22  GLUCOSE 90 96  BUN 32* 23  CREATININE 0.66 0.71  CALCIUM 8.6* 8.6*  MG 1.8 1.9  PHOS 2.7 2.9   Recent Labs    06/15/20 0648 06/16/20 0525  AST 14* 15  ALT 10 10  ALKPHOS 44 43  BILITOT 0.5 0.6  PROT 5.5* 5.2*  ALBUMIN 3.3* 3.0*   Recent Labs    06/15/20 0648 06/15/20 1301 06/15/20 2051 06/16/20 0525  WBC 5.1  --   --  6.1  NEUTROABS 3.3  --   --  3.9  HGB 7.4*   < > 8.7* 7.5*  HCT 24.1*   < > 27.7* 24.9*  MCV 96.4  --   --  98.4  PLT 207  --   --  210   < > = values in this interval not displayed.   Recent Labs    06/14/20 0023 06/15/20 0648  LABPROT 26.8* 14.7  INR 2.5* 1.2    Lab Results: Results for orders placed or performed during the hospital encounter of 06/12/20 (from  the past 48 hour(s))  Hemoglobin and hematocrit, blood     Status: Abnormal   Collection Time: 06/14/20 12:45 PM  Result Value Ref Range   Hemoglobin 8.1 (L) 12.0 - 15.0 g/dL   HCT 93.7 (L) 16.9 - 67.8 %    Comment: Performed at [facility], [address] [place], [place] [address]  Hemoglobin and hematocrit, blood     Status: Abnormal   Collection Time: 06/14/20  9:54 PM  Result Value Ref Range   Hemoglobin 7.3 (L) 12.0 - 15.0 g/dL   HCT 17.5 (L) 10.2 - 58.5 %    Comment: Performed at [facility], [address] [place], [place] [address]  Protime-INR     Status: None   Collection Time: 06/15/20  6:48 AM  Result Value Ref Range   Prothrombin Time 14.7 11.4 - 15.2 seconds   INR 1.2 0.8 - 1.2    Comment: (NOTE) INR goal varies based on device and disease states. Performed at [facility], [address] [place], [place]  [address]   Folate     Status: None   Collection Time: 06/15/20  6:48 AM  Result Value Ref Range   Folate 8.2 >5.9 ng/mL    Comment: Performed at [facility], [address] [place], [place] [address]  Iron and TIBC     Status: None   Collection Time: 06/15/20  6:48 AM  Result Value Ref Range   Iron 49 28 - 170 ug/dL   TIBC 154 008 - 676 ug/dL   Saturation Ratios 18 10.4 - 31.8 %   UIBC 226 ug/dL    Comment: Performed at [facility], [address], [place], [place] [address]  Ferritin     Status: None   Collection Time: 06/15/20  6:48 AM  Result Value Ref Range   Ferritin 25 11 - 307 ng/mL    Comment: Performed at [facility], [address] [place], [place] [address]  Vitamin B12     Status: Abnormal   Collection Time: 06/15/20  6:48 AM  Result Value Ref Range   Vitamin B-12 1,060 (H) 180 - 914 pg/mL    Comment: (NOTE) This assay is not validated for testing neonatal or myeloproliferative syndrome specimens for Vitamin B12 levels. Performed at [facility], [address] [place], [place] [address]   CBC with Differential/Platelet     Status: Abnormal   Collection Time: 06/15/20  6:48 AM  Result Value Ref Range   WBC 5.1 4.0 - 10.5 K/uL   RBC 2.50 (L) 3.87 - 5.11 MIL/uL   Hemoglobin 7.4 (L) 12.0 - 15.0 g/dL   HCT 99.8 (L) 33.8 - 25.0 %   MCV 96.4 80.0 - 100.0 fL   MCH 29.6 26.0 - 34.0 pg   MCHC 30.7 30.0 - 36.0 g/dL   RDW 53.9 76.7 - 34.1 %   Platelets 207 150 - 400 K/uL   nRBC 0.0 0.0 - 0.2 %   Neutrophils Relative % 66 %   Neutro Abs 3.3 1.7 - 7.7 K/uL   Lymphocytes Relative 19 %   Lymphs Abs 1.0 0.7 - 4.0 K/uL   Monocytes Relative 11 %   Monocytes Absolute 0.5 0.1 - 1.0 K/uL   Eosinophils Relative 4 %   Eosinophils Absolute 0.2 0.0 - 0.5 K/uL   Basophils Relative 0 %   Basophils Absolute 0.0 0.0 - 0.1 K/uL   Immature Granulocytes  0 %   Abs Immature Granulocytes 0.01 0.00 - 0.07  K/uL    Comment: Performed at [facility], [address] [place], [place] [address]  Comprehensive metabolic panel     Status: Abnormal   Collection Time: 06/15/20  6:48 AM  Result Value Ref Range   Sodium 140 135 - 145 mmol/L   Potassium 3.5 3.5 - 5.1 mmol/L   Chloride 110 98 - 111 mmol/L   CO2 25 22 - 32 mmol/L   Glucose, Bld 90 70 - 99 mg/dL    Comment: Glucose reference range applies only to samples taken after fasting for at least 8 hours.   BUN 32 (H) 8 - 23 mg/dL   Creatinine, Ser 6.040.66 0.44 - 1.00 mg/dL   Calcium 8.6 (L) 8.9 - 10.3 mg/dL   Total Protein 5.5 (L) 6.5 - 8.1 g/dL   Albumin 3.3 (L) 3.5 - 5.0 g/dL   AST 14 (L) 15 - 41 U/L   ALT 10 0 - 44 U/L   Alkaline Phosphatase 44 38 - 126 U/L   Total Bilirubin 0.5 0.3 - 1.2 mg/dL   GFR, Estimated >54>60 >09>60 mL/min    Comment: (NOTE) Calculated using the CKD-EPI Creatinine Equation (2021)    Anion gap 5 5 - 15    Comment: Performed at [facility], [address] [place], [place] [address]  Magnesium     Status: None   Collection Time: 06/15/20  6:48 AM  Result Value Ref Range   Magnesium 1.8 1.7 - 2.4 mg/dL    Comment: Performed at [facility], [address] [place], [place] [address]  Phosphorus     Status: None   Collection Time: 06/15/20  6:48 AM  Result Value Ref Range   Phosphorus 2.7 2.5 - 4.6 mg/dL    Comment: Performed at [facility], [address] [place], [place] [address]  Hemoglobin and hematocrit, blood     Status: Abnormal   Collection Time: 06/15/20  1:01 PM  Result Value Ref Range   Hemoglobin 7.3 (L) 12.0 - 15.0 g/dL   HCT 08.623.5 (L) 57.836.0 - 46.946.0 %    Comment: Performed at [facility], [address] [place], [place] [address]  Hemoglobin and hematocrit, blood     Status: Abnormal   Collection Time: 06/15/20  8:51 PM  Result Value Ref Range   Hemoglobin 8.7 (L) 12.0 - 15.0 g/dL   HCT 84.127.7 (L) 32.436.0 -  46.0 %    Comment: Performed at [facility], [address] [place], [place] [address]  CBC with Differential/Platelet     Status: Abnormal   Collection Time: 06/16/20  5:25 AM  Result Value Ref Range   WBC 6.1 4.0 - 10.5 K/uL   RBC 2.53 (L) 3.87 - 5.11 MIL/uL   Hemoglobin 7.5 (L) 12.0 - 15.0 g/dL   HCT 72.524.9 (L) 36.636.0 - 44.046.0 %   MCV 98.4 80.0 - 100.0 fL   MCH 29.6 26.0 - 34.0 pg   MCHC 30.1 30.0 - 36.0 g/dL   RDW 34.715.2 42.511.5 - 95.615.5 %   Platelets 210 150 - 400 K/uL   nRBC 0.0 0.0 - 0.2 %   Neutrophils Relative % 63 %   Neutro Abs 3.9 1.7 - 7.7 K/uL   Lymphocytes Relative 19 %   Lymphs Abs 1.2 0.7 - 4.0 K/uL   Monocytes Relative 13 %   Monocytes Absolute 0.8 0.1 -  1.0 K/uL   Eosinophils Relative 4 %   Eosinophils Absolute 0.2 0.0 - 0.5 K/uL   Basophils Relative 1 %   Basophils Absolute 0.0 0.0 - 0.1 K/uL   Immature Granulocytes 0 %   Abs Immature Granulocytes 0.01 0.00 - 0.07 K/uL    Comment: Performed at [facility], [address], [place], [place] [address]  Comprehensive metabolic panel     Status: Abnormal   Collection Time: 06/16/20  5:25 AM  Result Value Ref Range   Sodium 137 135 - 145 mmol/L   Potassium 3.6 3.5 - 5.1 mmol/L   Chloride 108 98 - 111 mmol/L   CO2 22 22 - 32 mmol/L   Glucose, Bld 96 70 - 99 mg/dL    Comment: Glucose reference range applies only to samples taken after fasting for at least 8 hours.   BUN 23 8 - 23 mg/dL   Creatinine, Ser 3.15 0.44 - 1.00 mg/dL   Calcium 8.6 (L) 8.9 - 10.3 mg/dL   Total Protein 5.2 (L) 6.5 - 8.1 g/dL   Albumin 3.0 (L) 3.5 - 5.0 g/dL   AST 15 15 - 41 U/L   ALT 10 0 - 44 U/L   Alkaline Phosphatase 43 38 - 126 U/L   Total Bilirubin 0.6 0.3 - 1.2 mg/dL   GFR, Estimated >17 >61 mL/min    Comment: (NOTE) Calculated using the CKD-EPI Creatinine Equation (2021)    Anion gap 7 5 - 15    Comment: Performed at [facility], [address] [place], [place] [address]   Phosphorus     Status: None   Collection Time: 06/16/20  5:25 AM  Result Value Ref Range   Phosphorus 2.9 2.5 - 4.6 mg/dL    Comment: Performed at [facility], [address] [place], [place] [address]  Magnesium     Status: None   Collection Time: 06/16/20  5:25 AM  Result Value Ref Range   Magnesium 1.9 1.7 - 2.4 mg/dL    Comment: Performed at [facility], [address] [place], [place] [address]    Studies/Results: No results found. Lab Results  Component Value Date   WBC 6.1 06/16/2020   HGB 7.5 (L) 06/16/2020   HCT 24.9 (L) 06/16/2020   MCV 98.4 06/16/2020   PLT 210 06/16/2020    Assessment: Rectal bleeding in patient with previous sigmoid colectomy due to diverticulitis with colostomy left lower quadrant. Lower AB pain  CBC is remaining stable hemoglobin (7.5, 7.4, 8.1, 8, 9.6) Atrial fibrillation  Plan: Continue suppositories, continue supportive care transfuse as needed below 7, serial CBC's Will schedule CT abdomen and pelvis, will do with oral contrast but without IV contrast due to shortage, to evaluate abdominal pain with rectal bleeding, some amber-colored urine. INR currently less than 2, will monitor today for rectal bleeding and CBC, ending CT abdomen pelvis may consider flex sig more diascopy especially if patient wants to continue on Coumadin for atrial fibrillation.  You take this into consideration moving forward for   Doree Albee PA-C 06/16/2020, 9:01 AM  Contact #  [phone]

## 2020-06-16 NOTE — Progress Notes (Signed)
PROGRESS NOTE    Candace Cain  DDU:[id number] DOB: October 09, 1920 DOA: 06/12/2020 PCP: Daisy Floro, MD   Chief Complaint  Patient presents with  . Rectal Bleeding   Brief Narrative:  Candace Cain is Candace Cain 87 y.o. female with medical history significant for atrial fibrillation on Coumadin,, hypothyroidism, perforated diverticulitis status post sigmoid colectomy and colostomy in 2017, now presenting to the emergency department red blood per rectum.  Patient reports that she was in her usual state and having an uneventful day until approximately 6 PM when she reports passing Candace Cain large volume of bright red blood on the toilet.  She has not seen any blood in her colostomy bag, has not had any abdominal or rectal pain, denies trauma, and has never experienced this before.  Family at bedside notes that they saw some red blood clots.  She was admitted for rectal bleeding.  This seems to have improved after vitamin K and normalization of INR, but now concern is for abdominal discomfort pending abdominal CT.  GI is following.   Assessment & Plan:   Principal Problem:   Rectal bleeding Active Problems:   Essential hypertension   Atrial fibrillation (HCC)   Hypothyroidism   Diverticulitis of colon with perforation s/p colectomy/colostomy 12/25/2015  1. Rectal bleeding  Anemia  Abdominal Pain - Patient on warfarin for Candace Cain fib and status post sigmoid colectomy and colostomy in 2017 presents with bright red blood per rectum  - Initial Hgb 11.6  - Hb relatively stable, follow - INR 1.2 today, follow  - TTP on 6/7 -> follow UA/culture as well as CT abd/pelvis (suprapubic and RLQ on my exam) - Repeat Hb, trend - transfuse for symptomatic or Hb <7 - discussed risks/benefits, she's agreeable - GI c/s, appreciate recommendations - recommending topical antihemorrhoidal therapies, if bleeding persists when INR <2, consider sigmoidoscopy per rectum to assess for diversion colitis or other causes of bleeding.   If pain/bleeding worsens, consider CT abd/pelvis.  Hold anticoagulation.  2. Atrial fibrillation  - Rate is controlled, hold warfarin pending GI eval  - INR 1.2 6/6 - s/p vitamin K 6/5  3. Hypertension  - Hold scheduled antihypertensives in light of acute GI bleeding and treat as-needed only for now    # Anemia Follow iron, b12, folate, ferritin - labs c/w AOCD  DVT prophylaxis: SCD Code Status: DNR Family Communication: none at bedside Disposition:   Status is: Observation  The patient Cain require care spanning > 2 midnights and should be moved to inpatient because: Inpatient level of care appropriate due to severity of illness  Dispo: The patient is from: Home              Anticipated d/c is to: Home              Patient currently is not medically stable to d/c.   Difficult to place patient No       Consultants:   GI  Procedures:  none  Antimicrobials: Anti-infectives (From admission, onward)   None         Subjective: C/o pain with palpation of aabdomen today  Objective: Vitals:   06/15/20 1410 06/15/20 2044 06/16/20 0451 06/16/20 1407  BP: 138/78 (!) 147/72 (!) 141/65 (!) 160/74  Pulse: (!) 59 75 (!) 58 60  Resp: 16 18 16 16   Temp: 98.9 F (37.2 C) 99.1 F (37.3 C) 98 F (36.7 C) 98.1 F (36.7 C)  TempSrc: Oral Oral Oral Oral  SpO2: 96% 98% 97% 98%  Weight:      Height:        Intake/Output Summary (Last 24 hours) at 06/16/2020 1910 Last data filed at 06/16/2020 1517 Gross per 24 hour  Intake 120 ml  Output 300 ml  Net -180 ml   Filed Weights   06/12/20 1958  Weight: 54.4 kg    Examination:  General: No acute distress. Cardiovascular: Heart sounds show Candace Cain regular rate, and rhythm.  Lungs: Clear to auscultation bilaterally  Abdomen: suprapubic and RLQ ttp - ostomy with brown stool  Neurological: Alert and oriented 3. Moves all extremities 4. Cranial nerves II through XII grossly intact. Skin: Warm and dry. No rashes or  lesions. Extremities: No clubbing or cyanosis. No edema.    Data Reviewed: I have personally reviewed following labs and imaging studies  CBC: Recent Labs  Lab 06/12/20 2021 06/13/20 0044 06/14/20 0023 06/14/20 1245 06/15/20 0648 06/15/20 1301 06/15/20 2051 06/16/20 0525 06/16/20 1256  WBC 6.1  --  5.9  --  5.1  --   --  6.1  --   NEUTROABS 4.8  --  3.8  --  3.3  --   --  3.9  --   HGB 11.6*   < > 8.0*   < > 7.4* 7.3* 8.7* 7.5* 8.4*  HCT 36.9   < > 25.7*   < > 24.1* 23.5* 27.7* 24.9* 28.7*  MCV 93.2  --  94.8  --  96.4  --   --  98.4  --   PLT 247  --  203  --  207  --   --  210  --    < > = values in this interval not displayed.    Basic Metabolic Panel: Recent Labs  Lab 06/12/20 2021 06/13/20 0445 06/14/20 0023 06/15/20 0648 06/16/20 0525  NA 137 139 137 140 137  K 4.6 3.6 3.6 3.5 3.6  CL 101 107 108 110 108  CO2 28 26 24 25 22   GLUCOSE 95 96 90 90 96  BUN 29* 24* 23 32* 23  CREATININE 0.82 0.42* 0.57 0.66 0.71  CALCIUM 9.3 8.7* 8.3* 8.6* 8.6*  MG  --   --  1.8 1.8 1.9  PHOS  --   --  2.6 2.7 2.9    GFR: Estimated Creatinine Clearance: 31.1 mL/min (by C-G formula based on SCr of 0.71 mg/dL).  Liver Function Tests: Recent Labs  Lab 06/12/20 2021 06/14/20 0023 06/15/20 0648 06/16/20 0525  AST 29 15 14* 15  ALT 12 10 10 10   ALKPHOS 57 44 44 43  BILITOT 0.6 0.7 0.5 0.6  PROT 7.1 5.5* 5.5* 5.2*  ALBUMIN 4.2 3.2* 3.3* 3.0*    CBG: No results for input(s): GLUCAP in the last 168 hours.   Recent Results (from the past 240 hour(s))  Resp Panel by RT-PCR (Flu Candace Cain&B, Covid) Nasopharyngeal Swab     Status: None   Collection Time: 06/12/20 10:44 PM   Specimen: Nasopharyngeal Swab; Nasopharyngeal(NP) swabs in vial transport medium  Result Value Ref Range Status   SARS Coronavirus 2 by RT PCR NEGATIVE NEGATIVE Final    Comment: (NOTE) SARS-CoV-2 target nucleic acids are NOT DETECTED.  The SARS-CoV-2 RNA is generally detectable in upper  respiratory specimens during the acute phase of infection. The lowest concentration of SARS-CoV-2 viral copies this assay can detect is 138 copies/mL. Candace Cain negative result does not preclude SARS-Cov-2 infection and should not be used as the sole basis for treatment or other patient management  decisions. Candace Cain negative result may occur with  improper specimen collection/handling, submission of specimen other than nasopharyngeal swab, presence of viral mutation(s) within the areas targeted by this assay, and inadequate number of viral copies(<138 copies/mL). Candace Cain negative result must be combined with clinical observations, patient history, and epidemiological information. The expected result is Negative.  Fact Sheet for Patients:  BloggerCourse.com  Fact Sheet for Healthcare Providers:  SeriousBroker.it  This test is no t yet approved or cleared by the [place] FDA and  has been authorized for detection and/or diagnosis of SARS-CoV-2 by FDA under an Emergency Use Authorization (EUA). This EUA Cain remain  in effect (meaning this test can be used) for the duration of the COVID-19 declaration under Section 564(b)(1) of the Act, 21 U.S.C.section 360bbb-3(b)(1), unless the authorization is terminated  or revoked sooner.       Influenza Candace Cain by PCR NEGATIVE NEGATIVE Final   Influenza B by PCR NEGATIVE NEGATIVE Final    Comment: (NOTE) The Xpert Xpress SARS-CoV-2/FLU/RSV plus assay is intended as an aid in the diagnosis of influenza from Nasopharyngeal swab specimens and should not be used as Candace Cain sole basis for treatment. Nasal washings and aspirates are unacceptable for Xpert Xpress SARS-CoV-2/FLU/RSV testing.  Fact Sheet for Patients: BloggerCourse.com  Fact Sheet for Healthcare Providers: SeriousBroker.it  This test is not yet approved or cleared by the [place] FDA and has been  authorized for detection and/or diagnosis of SARS-CoV-2 by FDA under an Emergency Use Authorization (EUA). This EUA Cain remain in effect (meaning this test can be used) for the duration of the COVID-19 declaration under Section 564(b)(1) of the Act, 21 U.S.C. section 360bbb-3(b)(1), unless the authorization is terminated or revoked.  Performed at [facility], [address] [place], [place] [address]          Radiology Studies: No results found.      Scheduled Meds:  Continuous Infusions:    LOS: 3 days    Time spent: over 30 min    Candace Nicks, MD [facility]   To contact the attending provider between 7A-7P or the covering provider during after hours 7P-7A, please log into the web site [email or web address] and access using universal Johnson password for that web site. If you do not have the password, please call the hospital operator.  06/16/2020, 7:10 PM

## 2020-06-17 ENCOUNTER — Inpatient Hospital Stay (HOSPITAL_COMMUNITY): Payer: Medicare Other

## 2020-06-17 LAB — URINALYSIS, ROUTINE W REFLEX MICROSCOPIC
Bilirubin Urine: NEGATIVE
Glucose, UA: NEGATIVE mg/dL
Hgb urine dipstick: NEGATIVE
Ketones, ur: NEGATIVE mg/dL
Leukocytes,Ua: NEGATIVE
Nitrite: NEGATIVE
Protein, ur: NEGATIVE mg/dL
Specific Gravity, Urine: 1.013 (ref 1.005–1.030)
pH: 5 (ref 5.0–8.0)

## 2020-06-17 LAB — HEMOGLOBIN AND HEMATOCRIT, BLOOD
HCT: 24 % — ABNORMAL LOW (ref 36.0–46.0)
HCT: 25.7 % — ABNORMAL LOW (ref 36.0–46.0)
HCT: 23.6 % — ABNORMAL LOW (ref 36.0–46.0)
Hemoglobin: 7.5 g/dL — ABNORMAL LOW (ref 12.0–15.0)
Hemoglobin: 8 g/dL — ABNORMAL LOW (ref 12.0–15.0)
Hemoglobin: 7.4 g/dL — ABNORMAL LOW (ref 12.0–15.0)

## 2020-06-17 NOTE — Progress Notes (Signed)
PROGRESS NOTE    Candace Cain  OEV:[id number] DOB: [birth year] DOA: 06/12/2020 PCP: Daisy Floro, MD   Brief Narrative: 87 y.o.femalewith medical history significant foratrial fibrillation on Coumadin,, hypothyroidism, perforated diverticulitis status post sigmoid colectomy and colostomy in 2017, now presenting to the emergency department red blood per rectum. Patient reports that she was in her usual state and having an uneventful day until approximately 6 PM when she reports passing a large volume of bright red blood on the toilet. She has not seen any blood in her colostomy bag, has not had any abdominal or rectal pain, denies trauma, and has never experienced this before. Family at bedside notes that they saw some red blood clots.  She was admitted for rectal bleeding.  This seems to have improved after vitamin K and normalization of INR.   Assessment & Plan:   Principal Problem:   Rectal bleeding Active Problems:   Essential hypertension   Atrial fibrillation (HCC)   Hypothyroidism   Diverticulitis of colon with perforation s/p colectomy/colostomy 12/25/2015    1.Rectal bleeding  Anemia  Abdominal Pain -Patient on warfarin for a fib and status post sigmoid colectomy and colostomy in 2017 presents with bright red blood per rectum -Initial Hgb 11.6 hemoglobin down to 8.0.  Coumadin still on hold. - GI c/s, appreciate recommendations - recommending topical antihemorrhoidal therapies, if bleeding persists when INR <2, consider sigmoidoscopy per rectum to assess for diversion colitis or other causes of bleeding.  If pain/bleeding worsens, consider CT abd/pelvis.  Hold anticoagulation. CT abdomen shows bladder distention will do bladder scanning and in and out catheter to see if she is retaining urine.  We will check a renal ultrasound. 2.Atrial fibrillation -Rate is controlled, hold warfarin pending GI eval - INR 1.2 6/6 - s/p vitamin K  6/5  3.Hypertension -Hold scheduled antihypertensives in light of acute GI bleeding and treat as-needed only for now  # Anemia Follow iron, b12, folate, ferritin - labs c/w AOCD  Estimated body mass index is 22.31 kg/m as calculated from the following:   Height as of this encounter: 5' 1.5" (1.562 m).   Weight as of this encounter: 54.4 kg.  DVT prophylaxis: SCD Code Status: DNR Family Communication: none at bedside Disposition:   Status is: Observation  The patient will require care spanning > 2 midnights and should be moved to inpatient because: Inpatient level of care appropriate due to severity of illness  Dispo: The patient is from: Home  Anticipated d/c is to: Home  Patient currently is not medically stable to d/c.              Difficult to place patient No       Consultants:   GI  Procedures:  none  Subjective: Very pleasant young lady resting in bed in no acute distress colostomy with black stools, she denies any abdominal pain or distention today. CT abdomen and pelvis shows marked distention of the bladder possibly reflecting changes of bladder outlet obstruction severe distal sigmoid diverticulosis small supraumbilical ventral hernia  Objective: Vitals:   06/16/20 1407 06/16/20 1936 06/17/20 0516 06/17/20 1401  BP: (!) 160/74 132/66 (!) 165/74 133/61  Pulse: 60 (!) 56 (!) 55 (!) 59  Resp: 16 18 14 16   Temp: 98.1 F (36.7 C) 99 F (37.2 C) 97.8 F (36.6 C) 98.4 F (36.9 C)  TempSrc: Oral Oral Oral Oral  SpO2: 98% 98% 95% 96%  Weight:      Height:  Intake/Output Summary (Last 24 hours) at 06/17/2020 1640 Last data filed at 06/17/2020 0940 Gross per 24 hour  Intake 120 ml  Output 350 ml  Net -230 ml   Filed Weights   06/12/20 1958  Weight: 54.4 kg    Examination:  General exam: Appears calm and comfortable  Respiratory system: Clear to auscultation. Respiratory effort normal. Cardiovascular  system: S1 & S2 heard, RRR. No JVD, murmurs, rubs, gallops or clicks. No pedal edema. Gastrointestinal system: Abdomen is nondistended, soft and nontender. No organomegaly or masses felt. Normal bowel sounds heard. Central nervous system: Alert and oriented. No focal neurological deficits. Extremities: Symmetric 5 x 5 power. Skin: No rashes, lesions or ulcers Psychiatry: Judgement and insight appear normal. Mood & affect appropriate.     Data Reviewed: I have personally reviewed following labs and imaging studies  CBC: Recent Labs  Lab 06/12/20 2021 06/13/20 0044 06/14/20 0023 06/14/20 1245 06/15/20 0648 06/15/20 1301 06/16/20 0525 06/16/20 1256 06/16/20 2050 06/17/20 0558 06/17/20 1236  WBC 6.1  --  5.9  --  5.1  --  6.1  --   --   --   --   NEUTROABS 4.8  --  3.8  --  3.3  --  3.9  --   --   --   --   HGB 11.6*   < > 8.0*   < > 7.4*   < > 7.5* 8.4* 7.7* 7.5* 8.0*  HCT 36.9   < > 25.7*   < > 24.1*   < > 24.9* 28.7* 25.0* 24.0* 25.7*  MCV 93.2  --  94.8  --  96.4  --  98.4  --   --   --   --   PLT 247  --  203  --  207  --  210  --   --   --   --    < > = values in this interval not displayed.   Basic Metabolic Panel: Recent Labs  Lab 06/12/20 2021 06/13/20 0445 06/14/20 0023 06/15/20 0648 06/16/20 0525  NA 137 139 137 140 137  K 4.6 3.6 3.6 3.5 3.6  CL 101 107 108 110 108  CO2 28 26 24 25 22   GLUCOSE 95 96 90 90 96  BUN 29* 24* 23 32* 23  CREATININE 0.82 0.42* 0.57 0.66 0.71  CALCIUM 9.3 8.7* 8.3* 8.6* 8.6*  MG  --   --  1.8 1.8 1.9  PHOS  --   --  2.6 2.7 2.9   GFR: Estimated Creatinine Clearance: 31.1 mL/min (by C-G formula based on SCr of 0.71 mg/dL). Liver Function Tests: Recent Labs  Lab 06/12/20 2021 06/14/20 0023 06/15/20 0648 06/16/20 0525  AST 29 15 14* 15  ALT 12 10 10 10   ALKPHOS 57 44 44 43  BILITOT 0.6 0.7 0.5 0.6  PROT 7.1 5.5* 5.5* 5.2*  ALBUMIN 4.2 3.2* 3.3* 3.0*   No results for input(s): LIPASE, AMYLASE in the last 168 hours. No  results for input(s): AMMONIA in the last 168 hours. Coagulation Profile: Recent Labs  Lab 06/12/20 2021 06/13/20 0445 06/14/20 0023 06/15/20 0648  INR 2.2* 2.4* 2.5* 1.2   Cardiac Enzymes: No results for input(s): CKTOTAL, CKMB, CKMBINDEX, TROPONINI in the last 168 hours. BNP (last 3 results) No results for input(s): PROBNP in the last 8760 hours. HbA1C: No results for input(s): HGBA1C in the last 72 hours. CBG: No results for input(s): GLUCAP in the last 168 hours. Lipid Profile: No  results for input(s): CHOL, HDL, LDLCALC, TRIG, CHOLHDL, LDLDIRECT in the last 72 hours. Thyroid Function Tests: No results for input(s): TSH, T4TOTAL, FREET4, T3FREE, THYROIDAB in the last 72 hours. Anemia Panel: Recent Labs    06/15/20 0648  VITAMINB12 1,060*  FOLATE 8.2  FERRITIN 25  TIBC 275  IRON 49   Sepsis Labs: No results for input(s): PROCALCITON, LATICACIDVEN in the last 168 hours.  Recent Results (from the past 240 hour(s))  Resp Panel by RT-PCR (Flu A&B, Covid) Nasopharyngeal Swab     Status: None   Collection Time: 06/12/20 10:44 PM   Specimen: Nasopharyngeal Swab; Nasopharyngeal(NP) swabs in vial transport medium  Result Value Ref Range Status   SARS Coronavirus 2 by RT PCR NEGATIVE NEGATIVE Final    Comment: (NOTE) SARS-CoV-2 target nucleic acids are NOT DETECTED.  The SARS-CoV-2 RNA is generally detectable in upper respiratory specimens during the acute phase of infection. The lowest concentration of SARS-CoV-2 viral copies this assay can detect is 138 copies/mL. A negative result does not preclude SARS-Cov-2 infection and should not be used as the sole basis for treatment or other patient management decisions. A negative result may occur with  improper specimen collection/handling, submission of specimen other than nasopharyngeal swab, presence of viral mutation(s) within the areas targeted by this assay, and inadequate number of viral copies(<138 copies/mL). A  negative result must be combined with clinical observations, patient history, and epidemiological information. The expected result is Negative.  Fact Sheet for Patients:  BloggerCourse.com  Fact Sheet for Healthcare Providers:  SeriousBroker.it  This test is no t yet approved or cleared by the [place] FDA and  has been authorized for detection and/or diagnosis of SARS-CoV-2 by FDA under an Emergency Use Authorization (EUA). This EUA will remain  in effect (meaning this test can be used) for the duration of the COVID-19 declaration under Section 564(b)(1) of the Act, 21 U.S.C.section 360bbb-3(b)(1), unless the authorization is terminated  or revoked sooner.       Influenza A by PCR NEGATIVE NEGATIVE Final   Influenza B by PCR NEGATIVE NEGATIVE Final    Comment: (NOTE) The Xpert Xpress SARS-CoV-2/FLU/RSV plus assay is intended as an aid in the diagnosis of influenza from Nasopharyngeal swab specimens and should not be used as a sole basis for treatment. Nasal washings and aspirates are unacceptable for Xpert Xpress SARS-CoV-2/FLU/RSV testing.  Fact Sheet for Patients: BloggerCourse.com  Fact Sheet for Healthcare Providers: SeriousBroker.it  This test is not yet approved or cleared by the [place] FDA and has been authorized for detection and/or diagnosis of SARS-CoV-2 by FDA under an Emergency Use Authorization (EUA). This EUA will remain in effect (meaning this test can be used) for the duration of the COVID-19 declaration under Section 564(b)(1) of the Act, 21 U.S.C. section 360bbb-3(b)(1), unless the authorization is terminated or revoked.  Performed at [facility], [address], [place], [place] [address]          Radiology Studies: CT ABDOMEN PELVIS WO CONTRAST  Result Date: 06/16/2020 CLINICAL DATA:  Right lower quadrant abdominal  pain, lower gastrointestinal hemorrhage EXAM: CT ABDOMEN AND PELVIS WITHOUT CONTRAST TECHNIQUE: Multidetector CT imaging of the abdomen and pelvis was performed following the standard protocol without IV contrast. COMPARISON:  12/31/2015 FINDINGS: Lower chest: The visualized mild cylindrical bronchiectasis and subpleural fibrosis is noted within the visualized lung bases. Extensive multi-vessel coronary artery calcification is noted. Global cardiac size within normal limits. Central pulmonary arteries are enlarged in keeping  with changes of pulmonary arterial hypertension. No pericardial effusion. Moderate atherosclerotic calcification noted within the thoracic aorta without evidence of aneurysm Hepatobiliary: No focal liver abnormality is seen. No gallstones, gallbladder wall thickening, or biliary dilatation. Pancreas: Unremarkable Spleen: Unremarkable Adrenals/Urinary Tract: The adrenal glands are unremarkable. The kidneys are normal. The bladder is markedly distended. Streak artifact related to right total hip arthroplasty slightly limits evaluation of the bladder. Stomach/Bowel: Large left lower quadrant spigelian hernia contains the distal descending and proximal sigmoid colon without evidence of obstruction. Small supraumbilical ventral hernia contains a single loop of unremarkable mid small bowel. There is severe distal sigmoid diverticulosis without superimposed inflammatory change. The stomach, small bowel, and large bowel are otherwise unremarkable. Appendix normal. No free intraperitoneal gas or fluid. Vascular/Lymphatic: There is moderate aortoiliac atherosclerotic calcification with particularly prominent atherosclerotic calcifications seen within the a superior mesenteric artery proximally as well as the or right renal artery origin. The degree of stenosis is not well assessed on this noncontrast examination. No aortic aneurysm. No pathologic adenopathy within the abdomen and pelvis. Reproductive:  Status post hysterectomy. No adnexal masses. Other: Small left inguinal lipoma.  The rectum is unremarkable. Musculoskeletal: Right total hip arthroplasty has been performed. No acute bone abnormality. Osseous structures are age-appropriate. IMPRESSION: Marked distension of the bladder, possibly reflecting changes of bladder outlet obstruction. Severe distal sigmoid diverticulosis without superimposed inflammatory change. Small supraumbilical ventral hernia and large left lower quadrant spigelian hernia containing loops of small and large bowel, respectively without evidence of obstruction. Extensive multi-vessel coronary artery calcification. Morphologic changes in keeping with pulmonary arterial hypertension. Mild bibasilar cylindrical bronchiectasis and subpleural fibrosis. Peripheral vascular disease. If there is clinical evidence of chronic mesenteric ischemia or hemodynamically significant renal artery stenosis, CT arteriography may be more helpful for further evaluation. Aortic Atherosclerosis (ICD10-I70.0). Electronically Signed   By: Helyn NumbersAshesh  Parikh MD   On: 06/16/2020 20:44        Scheduled Meds: Continuous Infusions:   LOS: 4 days     Alwyn RenElizabeth G Alyshia Kernan, MD  06/17/2020, 4:40 PM

## 2020-06-17 NOTE — TOC Initial Note (Addendum)
Transition of Care [facility]) - Initial/Assessment Note    Patient Details  Name: Candace Cain MRN: [id number] Date of Birth: [birth year]  Transition of Care [facility] CM/SW Contact:    [name], RN Phone Number: 06/17/2020, 10:40 AM  Clinical Narrative:                 Pt from [facility] Independent living. Per physical therapy evaluation SNF is recommended at this time. Per [name] at [name] pt will go to SNF section at [facility]. FL2 completed and insurance auth started with [facility] (ref# [id number]).   Addendum: 1540 Auth approval received from [facility]. [id number].  Expected Discharge Plan: Skilled Nursing Facility Barriers to Discharge: Continued Medical Work up  Expected Discharge Plan and Services Expected Discharge Plan: Skilled Nursing Facility   Discharge Planning Services: CM Consult   Living arrangements for the past 2 months: Independent Living Facility                   Prior Living Arrangements/Services Living arrangements for the past 2 months: Independent Living Facility Lives with:: Self                   Activities of Daily Living Home Assistive Devices/Equipment: Environmental consultant (specify type) (all wheel walker) ADL Screening (condition at time of admission) Patient's cognitive ability adequate to safely complete daily activities?: Yes Is the patient deaf or have difficulty hearing?: Yes Does the patient have difficulty seeing, even when wearing glasses/contacts?: Yes Does the patient have difficulty concentrating, remembering, or making decisions?: No Patient able to express need for assistance with ADLs?: Yes Does the patient have difficulty dressing or bathing?: Yes Independently performs ADLs?: No Communication: Independent Dressing (OT): Needs assistance Is this a change from baseline?: Pre-admission baseline Grooming: Needs assistance Is this a change from baseline?: Pre-admission baseline Feeding: Appropriate for developmental age Bathing: Needs  assistance Is this a change from baseline?: Pre-admission baseline Toileting: Needs assistance Is this a change from baseline?: Pre-admission baseline In/Out Bed: Needs assistance Is this a change from baseline?: Pre-admission baseline Walks in Home: Needs assistance Is this a change from baseline?: Pre-admission baseline Does the patient have difficulty walking or climbing stairs?: Yes Weakness of Legs: Right Weakness of Arms/Hands: Left  Permission Sought/Granted                  Emotional Assessment              Admission diagnosis:  Rectal bleeding [K62.5] Patient Active Problem List   Diagnosis Date Noted  . Rectal bleeding 06/12/2020  . Diverticulitis of large intestine with abscess without bleeding   . Hypokalemia   . Protein-calorie malnutrition, severe 12/25/2015  . Diverticulitis of colon with perforation s/p colectomy/colostomy 12/25/2015 12/24/2015  . Closed right hip fracture (HCC) 04/26/2014  . Hip fracture requiring operative repair (HCC) 04/25/2014  . Dehydration, moderate 03/28/2014  . Hypothyroidism 03/28/2014  . GERD (gastroesophageal reflux disease) 03/28/2014  . Depression 03/28/2014  . Community acquired pneumonia 03/28/2014  . Hemoptysis   . Hyponatremia 03/27/2014  . Long term (current) use of anticoagulants 04/21/2011  . CHEST PAIN 04/22/2008  . Essential hypertension 04/19/2008  . Atrial fibrillation (HCC) 04/19/2008  . BRADYCARDIA 04/19/2008   PCP:  Daisy Floro, MD Pharmacy:   [facility] [address] [place] - [address] [place] [address] Phone: [phone] Fax: [phone]  [facility] [address], [place] - [address] [phone]  [address] [place] [address] Phone: [phone] Fax: [phone]     Social Determinants of Health (SDOH) Interventions    Readmission Risk Interventions No flowsheet data found.

## 2020-06-17 NOTE — Progress Notes (Signed)
Bladder scan resulted 901 ml. RN performed in and out catheretization on patient. 850 ml of urine returned. Post void residual bladder scan one hours after in and out cath resulted in 140 ml.

## 2020-06-17 NOTE — Progress Notes (Signed)
[facility] Progress Note  Candace Cain 87 y.o. [birth year]  CC:  Rectal bleeding   Subjective: Patient with history of post sigmoid colectomy and colostomy in 2017 for perforated diverticulitis, with atrial fibrillation on Coumadin.  This was held and she was given vitamin K, INR today is at 1.2.  Patient is complaining of suprapubic and right lower quadrant pain, states this is improved but ongoing.    Patient has wick that has yellow urine in it at this time.    Patient has soft brown stool without any blood in colostomy bag. Patient denies any nausea, vomiting, chills fever.   Patient denies chest pain, shortness of breath, leg swelling. Tolerated food well last night  Lab Results  Component Value Date   INR 1.2 06/15/2020   INR 2.5 (H) 06/14/2020   INR 2.4 (H) 06/13/2020     ROS : Review of Systems  Constitutional: Negative for chills and fever.  HENT: Positive for hearing loss.   Respiratory: Negative for shortness of breath.   Cardiovascular: Negative for chest pain.  Gastrointestinal: Positive for abdominal pain. Negative for constipation, diarrhea, nausea and vomiting.  Neurological: Negative for loss of consciousness.    Objective: Vital signs in last 24 hours: Vitals:   06/16/20 1936 06/17/20 0516  BP: 132/66 (!) 165/74  Pulse: (!) 56 (!) 55  Resp: 18 14  Temp: 99 F (37.2 C) 97.8 F (36.6 C)  SpO2: 98% 95%    Physical Exam: Physical Exam Constitutional:      Comments: Thin appearing female younger than stated age in no acute distress.  HENT:     Head: Normocephalic.  Eyes:     Conjunctiva/sclera: Conjunctivae normal.  Abdominal:     Comments: Left lower quadrant colostomy with brown stool, no blood. Umbilical hernia, abdomen soft, some suprapubic and right lower quadrant tenderness, no rebound, some bladder fullness.   Musculoskeletal:        General: No swelling.  Skin:    General: Skin is warm.     Comments: Ecchymosis bilateral arm   Neurological:     General: No focal deficit present.     Mental Status: Mental status is at baseline.  Psychiatric:        Mood and Affect: Mood normal.      Lab Results: Recent Labs    06/15/20 0648 06/16/20 0525  NA 140 137  K 3.5 3.6  CL 110 108  CO2 25 22  GLUCOSE 90 96  BUN 32* 23  CREATININE 0.66 0.71  CALCIUM 8.6* 8.6*  MG 1.8 1.9  PHOS 2.7 2.9   Recent Labs    06/15/20 0648 06/16/20 0525  AST 14* 15  ALT 10 10  ALKPHOS 44 43  BILITOT 0.5 0.6  PROT 5.5* 5.2*  ALBUMIN 3.3* 3.0*   Recent Labs    06/15/20 0648 06/15/20 1301 06/16/20 0525 06/16/20 1256 06/16/20 2050 06/17/20 0558  WBC 5.1  --  6.1  --   --   --   NEUTROABS 3.3  --  3.9  --   --   --   HGB 7.4*   < > 7.5*   < > 7.7* 7.5*  HCT 24.1*   < > 24.9*   < > 25.0* 24.0*  MCV 96.4  --  98.4  --   --   --   PLT 207  --  210  --   --   --    < > = values in this interval  not displayed.   Recent Labs    06/15/20 0648  LABPROT 14.7  INR 1.2    Lab Results: Results for orders placed or performed during the hospital encounter of 06/12/20 (from the past 48 hour(s))  Hemoglobin and hematocrit, blood     Status: Abnormal   Collection Time: 06/15/20  1:01 PM  Result Value Ref Range   Hemoglobin 7.3 (L) 12.0 - 15.0 g/dL   HCT 78.223.5 (L) 95.636.0 - 21.346.0 %    Comment: Performed at [facility], [address] [place], [place] [address]  Hemoglobin and hematocrit, blood     Status: Abnormal   Collection Time: 06/15/20  8:51 PM  Result Value Ref Range   Hemoglobin 8.7 (L) 12.0 - 15.0 g/dL   HCT 84.627.7 (L) 96.236.0 - 95.246.0 %    Comment: Performed at [facility], [address] [place], [place] [address]  CBC with Differential/Platelet     Status: Abnormal   Collection Time: 06/16/20  5:25 AM  Result Value Ref Range   WBC 6.1 4.0 - 10.5 K/uL   RBC 2.53 (L) 3.87 - 5.11 MIL/uL   Hemoglobin 7.5 (L) 12.0 - 15.0 g/dL   HCT 44.024.9 (L) 10.236.0 - 72.546.0 %   MCV 98.4 80.0 - 100.0 fL    MCH 29.6 26.0 - 34.0 pg   MCHC 30.1 30.0 - 36.0 g/dL   RDW 36.615.2 44.011.5 - 34.715.5 %   Platelets 210 150 - 400 K/uL   nRBC 0.0 0.0 - 0.2 %   Neutrophils Relative % 63 %   Neutro Abs 3.9 1.7 - 7.7 K/uL   Lymphocytes Relative 19 %   Lymphs Abs 1.2 0.7 - 4.0 K/uL   Monocytes Relative 13 %   Monocytes Absolute 0.8 0.1 - 1.0 K/uL   Eosinophils Relative 4 %   Eosinophils Absolute 0.2 0.0 - 0.5 K/uL   Basophils Relative 1 %   Basophils Absolute 0.0 0.0 - 0.1 K/uL   Immature Granulocytes 0 %   Abs Immature Granulocytes 0.01 0.00 - 0.07 K/uL    Comment: Performed at [facility], [address] [place], [place] [address]  Comprehensive metabolic panel     Status: Abnormal   Collection Time: 06/16/20  5:25 AM  Result Value Ref Range   Sodium 137 135 - 145 mmol/L   Potassium 3.6 3.5 - 5.1 mmol/L   Chloride 108 98 - 111 mmol/L   CO2 22 22 - 32 mmol/L   Glucose, Bld 96 70 - 99 mg/dL    Comment: Glucose reference range applies only to samples taken after fasting for at least 8 hours.   BUN 23 8 - 23 mg/dL   Creatinine, Ser 6.380.71 0.44 - 1.00 mg/dL   Calcium 8.6 (L) 8.9 - 10.3 mg/dL   Total Protein 5.2 (L) 6.5 - 8.1 g/dL   Albumin 3.0 (L) 3.5 - 5.0 g/dL   AST 15 15 - 41 U/L   ALT 10 0 - 44 U/L   Alkaline Phosphatase 43 38 - 126 U/L   Total Bilirubin 0.6 0.3 - 1.2 mg/dL   GFR, Estimated >75>60 >64>60 mL/min    Comment: (NOTE) Calculated using the CKD-EPI Creatinine Equation (2021)    Anion gap 7 5 - 15    Comment: Performed at [facility], [address] [place], [place] [address]  Phosphorus     Status: None   Collection Time: 06/16/20  5:25 AM  Result Value Ref  Range   Phosphorus 2.9 2.5 - 4.6 mg/dL    Comment: Performed at [facility], [address] [place], [place] [address]  Magnesium     Status: None   Collection Time: 06/16/20  5:25 AM  Result Value Ref Range   Magnesium 1.9 1.7 - 2.4 mg/dL    Comment: Performed at [facility], [address] [place], [place] [address]  Hemoglobin and hematocrit, blood     Status: Abnormal   Collection Time: 06/16/20 12:56 PM  Result Value Ref Range   Hemoglobin 8.4 (L) 12.0 - 15.0 g/dL   HCT 42.6 (L) 83.4 - 19.6 %    Comment: Performed at [facility], [address] [place], [place] [address]  Hemoglobin and hematocrit, blood     Status: Abnormal   Collection Time: 06/16/20  8:50 PM  Result Value Ref Range   Hemoglobin 7.7 (L) 12.0 - 15.0 g/dL   HCT 98.9 (L) 21.1 - 94.1 %    Comment: Performed at [facility], [address] [place], [place] [address]  Hemoglobin and hematocrit, blood     Status: Abnormal   Collection Time: 06/17/20  5:58 AM  Result Value Ref Range   Hemoglobin 7.5 (L) 12.0 - 15.0 g/dL   HCT 44.8 (L) 18.5 - 63.1 %    Comment: Performed at [facility], [address] [place], [place] [address]    Studies/Results: CT ABDOMEN PELVIS WO CONTRAST  Result Date: 06/16/2020 CLINICAL DATA:  Right lower quadrant abdominal pain, lower gastrointestinal hemorrhage EXAM: CT ABDOMEN AND PELVIS WITHOUT CONTRAST TECHNIQUE: Multidetector CT imaging of the abdomen and pelvis was performed following the standard protocol without IV contrast. COMPARISON:  12/31/2015 FINDINGS: Lower chest: The visualized mild cylindrical bronchiectasis and subpleural fibrosis is noted within the visualized lung bases. Extensive multi-vessel coronary artery calcification is noted. Global cardiac size within normal limits. Central pulmonary arteries are enlarged in keeping with changes of pulmonary arterial hypertension. No pericardial effusion. Moderate atherosclerotic calcification noted within the thoracic aorta without evidence of aneurysm Hepatobiliary: No focal liver abnormality is seen. No gallstones, gallbladder wall thickening, or biliary dilatation. Pancreas: Unremarkable Spleen: Unremarkable Adrenals/Urinary  Tract: The adrenal glands are unremarkable. The kidneys are normal. The bladder is markedly distended. Streak artifact related to right total hip arthroplasty slightly limits evaluation of the bladder. Stomach/Bowel: Large left lower quadrant spigelian hernia contains the distal descending and proximal sigmoid colon without evidence of obstruction. Small supraumbilical ventral hernia contains a single loop of unremarkable mid small bowel. There is severe distal sigmoid diverticulosis without superimposed inflammatory change. The stomach, small bowel, and large bowel are otherwise unremarkable. Appendix normal. No free intraperitoneal gas or fluid. Vascular/Lymphatic: There is moderate aortoiliac atherosclerotic calcification with particularly prominent atherosclerotic calcifications seen within the a superior mesenteric artery proximally as well as the or right renal artery origin. The degree of stenosis is not well assessed on this noncontrast examination. No aortic aneurysm. No pathologic adenopathy within the abdomen and pelvis. Reproductive: Status post hysterectomy. No adnexal masses. Other: Small left inguinal lipoma.  The rectum is unremarkable. Musculoskeletal: Right total hip arthroplasty has been performed. No acute bone abnormality. Osseous structures are age-appropriate. IMPRESSION: Marked distension of the bladder, possibly reflecting changes of bladder outlet obstruction. Severe distal sigmoid diverticulosis without superimposed inflammatory change. Small supraumbilical ventral hernia and large left lower quadrant spigelian hernia containing loops of small and large bowel, respectively without evidence of obstruction. Extensive  multi-vessel coronary artery calcification. Morphologic changes in keeping with pulmonary arterial hypertension. Mild bibasilar cylindrical bronchiectasis and subpleural fibrosis. Peripheral vascular disease. If there is clinical evidence of chronic mesenteric ischemia or  hemodynamically significant renal artery stenosis, CT arteriography may be more helpful for further evaluation. Aortic Atherosclerosis (ICD10-I70.0). Electronically Signed   By: Helyn Numbers MD   On: 06/16/2020 20:44   Lab Results  Component Value Date   WBC 6.1 06/16/2020   HGB 7.5 (L) 06/17/2020   HCT 24.0 (L) 06/17/2020   MCV 98.4 06/16/2020   PLT 210 06/16/2020    Assessment: Rectal bleeding in patient with previous sigmoid colectomy due to diverticulitis with colostomy left lower quadrant. Lower AB pain with bladder distention CBC is remaining stable but not improving hemoglobin (7.5, 7.4, 8.1, 8, 9.6) Atrial fibrillation  Plan: CT abdomen pelvis showed bladder distention, diverticulosis without inflammation.   No inflammation of the colon, large bowel small bowel.   INR currently less than 2 From CT possible diverticular bleed versus hemorrhoids Continue suppositories, continue supportive care transfuse as needed below 7 may benefit from trasfusion, serial CBC's No more episodes of rectal bleeding, CBC is stable. No need for procedure at this time with CT results and resolution.  Suggest urology referral for bladder distention.     Doree Albee PA-C 06/17/2020, 10:13 AM  Contact #  [phone]

## 2020-06-17 NOTE — [place] FL2 (Signed)
[place] MEDICAID FL2 LEVEL OF CARE SCREENING TOOL     IDENTIFICATION  Patient Name: Deshonda Cryderman Birthdate: 09-03-1920 Sex: female Admission Date (Current Location): 06/12/2020  [facility] and [place] Number:  Producer, television/film/video and Address:  [facility],  [address], [place] [address]      Provider Number: [id number]  Attending Physician Name and Address:  Alwyn Ren, MD  Relative Name and Phone Number:       Current Level of Care: Hospital Recommended Level of Care: Skilled Nursing Facility Prior Approval Number:    Date Approved/Denied:   PASRR Number: [id number]  Discharge Plan: SNF    Current Diagnoses: Patient Active Problem List   Diagnosis Date Noted  . Rectal bleeding 06/12/2020  . Diverticulitis of large intestine with abscess without bleeding   . Hypokalemia   . Protein-calorie malnutrition, severe 12/25/2015  . Diverticulitis of colon with perforation s/p colectomy/colostomy 12/25/2015 12/24/2015  . Closed right hip fracture (HCC) 04/26/2014  . Hip fracture requiring operative repair (HCC) 04/25/2014  . Dehydration, moderate 03/28/2014  . Hypothyroidism 03/28/2014  . GERD (gastroesophageal reflux disease) 03/28/2014  . Depression 03/28/2014  . Community acquired pneumonia 03/28/2014  . Hemoptysis   . Hyponatremia 03/27/2014  . Long term (current) use of anticoagulants 04/21/2011  . CHEST PAIN 04/22/2008  . Essential hypertension 04/19/2008  . Atrial fibrillation (HCC) 04/19/2008  . BRADYCARDIA 04/19/2008    Orientation RESPIRATION BLADDER Height & Weight     Self,Time,Situation,Place  Normal Indwelling catheter Weight: 54.4 kg Height:  5' 1.5" (156.2 cm)  BEHAVIORAL SYMPTOMS/MOOD NEUROLOGICAL BOWEL NUTRITION STATUS      Incontinent Diet (Regular)  AMBULATORY STATUS COMMUNICATION OF NEEDS Skin   Extensive Assist Verbally Normal                       Personal Care Assistance Level of Assistance   Bathing,Dressing,Feeding Bathing Assistance: Limited assistance Feeding assistance: Limited assistance Dressing Assistance: Limited assistance     Functional Limitations Info  Sight,Hearing,Speech Sight Info: Impaired Hearing Info: Impaired Speech Info: Adequate    SPECIAL CARE FACTORS FREQUENCY  PT (By licensed PT),OT (By licensed OT)     PT Frequency: 5 x weekly OT Frequency: 5 x weekly            Contractures Contractures Info: Not present    Additional Factors Info  Code Status,Allergies Code Status Info: DNR Allergies Info: Amlodipine Besylate, Doxycycline, Hydrocodone Bit -homatrop Mbr, Terbinafine Hcl, Tramadol Hcl           Current Medications (06/17/2020):  This is the current hospital active medication list Current Facility-Administered Medications  Medication Dose Route Frequency Provider Last Rate Last Admin  . acetaminophen (TYLENOL) tablet 650 mg  650 mg Oral Q6H PRN Opyd, Lavone Neri, MD   650 mg at 06/16/20 0007   Or  . acetaminophen (TYLENOL) suppository 650 mg  650 mg Rectal Q6H PRN Opyd, Lavone Neri, MD      . hydrALAZINE (APRESOLINE) tablet 25 mg  25 mg Oral Q8H PRN Opyd, Lavone Neri, MD      . ondansetron (ZOFRAN) tablet 4 mg  4 mg Oral Q6H PRN Opyd, Lavone Neri, MD       Or  . ondansetron (ZOFRAN) injection 4 mg  4 mg Intravenous Q6H PRN Opyd, Lavone Neri, MD         Discharge Medications: Please see discharge summary for a list of discharge medications.  Relevant Imaging Results:  Relevant  Lab Results:   Additional Information SSN: [id number]  Steffani Dionisio, Meriam Sprague, RN

## 2020-06-17 NOTE — Progress Notes (Signed)
Physical Therapy Treatment Patient Details Name: Candace Cain MRN: [id number] DOB: [birth year] Today's Date: 06/17/2020    History of Present Illness 87 yo female admitted with rectal bleeding, abd pain. Hx of PAF, R hip hemiarthroplasty 2016, bradycardia, hypothyroidism, colectomy 2017    PT Comments    Pt tolerates increased ambulation tolerance, VCs for upright posture and weight shift anterior to upright alignment, limited to steps forward and backwards in room per pt request. Pt initally requires min A due to posterior lean, but progresses to min guard with time. Transfer training STS reps with VCs for hand placement and weight shift anterior, maintains BLE braced against bed when rising from EOB. Educated pt on when to transfer hands from seated surface to RW and then return to seated surface. Pt able to perform pericare with min A to steady in static standing. Educated pt on benefit of time OOB with all meals and transferring to [facility] or ambulating to restroom with nursing to increase steps and minimize time in bed and pt verbalized understanding. Continue acute PT as able.   Follow Up Recommendations  SNF (unless pt/family decline placement and prefer home. If returns home, then HHPT)     Equipment Recommendations  None recommended by PT    Recommendations for Other Services       Precautions / Restrictions Precautions Precautions: Fall Restrictions Weight Bearing Restrictions: No    Mobility  Bed Mobility Overal bed mobility: Needs Assistance Bed Mobility: Supine to Sit;Sit to Supine  Supine to sit: Min assist;HOB elevated Sit to supine: Min guard   General bed mobility comments: pt mobilizes BLE to EOB with HOB elevated and use of bedrail, min A to upright trunk and scoot out to EOB with bed pad; min guard to lift BLE back into bed    Transfers Overall transfer level: Needs assistance Equipment used: Rolling walker (2 wheeled) Transfers: Sit to/from [employer] Sit to Stand: Min assist Stand pivot transfers: Min assist  General transfer comment: min A to power to stand with weight shift anterior due to posterior lean, BLE braced against front of bed, min A to steady with RW to pivot to [facility] then back to bed  Ambulation/Gait Ambulation/Gait assistance: Min assist;Min guard Gait Distance (Feet): 30 Feet Assistive device: Rolling walker (2 wheeled) Gait Pattern/deviations: Step-to pattern;Decreased stride length;Trunk flexed Gait velocity: decreased   General Gait Details: short steps forward and backwards at bedside per pt request, initial bout with min A due to posterior lean, improved to min guard with cues and distance   Stairs             Wheelchair Mobility    Modified Rankin (Stroke Patients Only)       Balance Overall balance assessment: Needs assistance Sitting-balance support: No upper extremity supported Sitting balance-Leahy Scale: Fair Sitting balance - Comments: seated EOB Postural control: Posterior lean Standing balance support: Bilateral upper extremity supported;During functional activity Standing balance-Leahy Scale: Poor Standing balance comment: reliant on UE support       Cognition Arousal/Alertness: Awake/alert Behavior During Therapy: WFL for tasks assessed/performed Overall Cognitive Status: Within Functional Limits for tasks assessed       Exercises General Exercises - Lower Extremity Gluteal Sets: Seated;AROM;Strengthening;Both;10 reps (gait belt resistance) Long Arc Quad: Seated;AROM;Strengthening;Both;10 reps Hip Flexion/Marching: Seated;AROM;Strengthening;Both;10 reps    General Comments        Pertinent Vitals/Pain Pain Assessment: Faces Faces Pain Scale: Hurts a little bit Pain Location: abdominal with coughing Pain Descriptors / Indicators:  Discomfort Pain Intervention(s): Limited activity within patient's tolerance;Monitored during session    Home Living   Living  Arrangements: Alone   Type of Home: Independent living facility Home Access: Level entry   Home Layout: One level Home Equipment: Walker - 4 wheels;Cane - single point;Toilet riser;Grab bars - toilet;Grab bars - tub/shower      Prior Function Level of Independence: Independent with assistive device(s)      Comments: uses rollator for ambulation. performs ADL Mod Ind per pt   PT Goals (current goals can now be found in the care plan section) Acute Rehab PT Goals Patient Stated Goal: back to Ind Living PT Goal Formulation: With patient/family Time For Goal Achievement: 06/30/20 Potential to Achieve Goals: Good Progress towards PT goals: Progressing toward goals    Frequency    Min 3X/week      PT Plan Current plan remains appropriate    Co-evaluation              AM-PAC PT "6 Clicks" Mobility   Outcome Measure  Help needed turning from your back to your side while in a flat bed without using bedrails?: A Little Help needed moving from lying on your back to sitting on the side of a flat bed without using bedrails?: A Little Help needed moving to and from a bed to a chair (including a wheelchair)?: A Little Help needed standing up from a chair using your arms (e.g., wheelchair or bedside chair)?: A Little Help needed to walk in hospital room?: A Little Help needed climbing 3-5 steps with a railing? : A Lot 6 Click Score: 17    End of Session Equipment Utilized During Treatment: Gait belt Activity Tolerance: Patient tolerated treatment well Patient left: in bed;with call bell/phone within reach;with family/visitor present Nurse Communication: Mobility status PT Visit Diagnosis: Muscle weakness (generalized) (M62.81);History of falling (Z91.81);Unsteadiness on feet (R26.81)     Time: 6734-1937 PT Time Calculation (min) (ACUTE ONLY): 35 min  Charges:  $Gait Training: 8-22 mins $Therapeutic Activity: 8-22 mins                      Tori Delaney Perona PT,  DPT 06/17/20, 1:18 PM

## 2020-06-17 NOTE — Evaluation (Signed)
Occupational Therapy Evaluation Patient Details Name: Candace Cain MRN: [id number] DOB: 03-10-1920 Today's Date: 06/17/2020    History of Present Illness 87 yo female admitted with rectal bleeding, abd pain. Hx of PAF, R hip hemiarthroplasty 2016, bradycardia, hypothyroidism, colectomy 2017   Clinical Impression   Candace Cain is a 87 year old woman typically modified independent with rollator for ambulation and independent for ADLs at independent living facility. On evaluation patient presents with generalized weakness, decreased activity tolerance, and poor balance resulting in needing mod assist for transfers, ambulation limited to [facility] then to recliner and needing mod-max assist for LB ADLs and toileting. Patient will benefit from skilled OT services while in hospital to improve deficits and learn compensatory strategies as needed in order to return to PLOF.  Patient and nursing encouraged to limit use of purewick and have patient up to [facility] during the day to promote strengthening and improved mobility. Recommend patient discharge to SNF prior to return to [facility].    Follow Up Recommendations  SNF    Equipment Recommendations  None recommended by OT    Recommendations for Other Services       Precautions / Restrictions Precautions Precautions: Fall Restrictions Weight Bearing Restrictions: No      Mobility Bed Mobility Overal bed mobility: Needs Assistance Bed Mobility: Supine to Sit     Supine to sit: Mod assist     General bed mobility comments: Mod assist for LEs and trunk. Increased time.    Transfers Overall transfer level: Needs assistance Equipment used: Rolling walker (2 wheeled) Transfers: Sit to/from Stand Sit to Stand: Mod assist;From elevated surface         General transfer comment: Mod assist to stand. Exhibiting mild posterior lean. Mod assist to pivot to [facility] and then to recilner - needing assistance for balance, walker  management and verbal cues for technique.    Balance Overall balance assessment: Needs assistance Sitting-balance support: No upper extremity supported Sitting balance-Leahy Scale: Fair   Postural control: Posterior lean Standing balance support: Bilateral upper extremity supported Standing balance-Leahy Scale: Poor                             ADL either performed or assessed with clinical judgement   ADL Overall ADL's : Needs assistance/impaired Eating/Feeding: Independent   Grooming: Set up;Sitting   Upper Body Bathing: Set up;Sitting   Lower Body Bathing: Moderate assistance;Sit to/from stand   Upper Body Dressing : Set up;Sitting   Lower Body Dressing: Maximal assistance;Sit to/from stand   Toilet Transfer: Moderate assistance;Stand-pivot;BSC;RW   Toileting- Clothing Manipulation and Hygiene: Maximal assistance Toileting - Clothing Manipulation Details (indicate cue type and reason): able tip wipe in seated position. max assist for clothing management             Vision Patient Visual Report: No change from baseline       Perception     Praxis      Pertinent Vitals/Pain Pain Assessment: No/denies pain     Hand Dominance     Extremity/Trunk Assessment Upper Extremity Assessment Upper Extremity Assessment: Generalized weakness   Lower Extremity Assessment Lower Extremity Assessment: Defer to PT evaluation   Cervical / Trunk Assessment Cervical / Trunk Assessment: Kyphotic   Communication Communication Communication: HOH   Cognition Arousal/Alertness: Awake/alert Behavior During Therapy: WFL for tasks assessed/performed Overall Cognitive Status: Within Functional Limits for tasks assessed  General Comments       Exercises     Shoulder Instructions      Home Living   Living Arrangements: Alone   Type of Home: Independent living facility Home Access: Level entry     Home  Layout: One level               Home Equipment: Walker - 4 wheels;Cane - single point;Toilet riser;Grab bars - toilet;Grab bars - tub/shower          Prior Functioning/Environment Level of Independence: Independent with assistive device(s)        Comments: uses rollator for ambulation. performs ADL Mod Ind per pt        OT Problem List: Decreased strength;Decreased activity tolerance;Impaired balance (sitting and/or standing);Decreased knowledge of use of DME or AE      OT Treatment/Interventions: Self-care/ADL training;Therapeutic exercise;DME and/or AE instruction;Therapeutic activities;Balance training;Patient/family education    OT Goals(Current goals can be found in the care plan section) Acute Rehab OT Goals Patient Stated Goal: walk to bathroom OT Goal Formulation: With patient Time For Goal Achievement: 07/01/20 Potential to Achieve Goals: Good  OT Frequency: Min 2X/week   Barriers to D/C:            Co-evaluation              AM-PAC OT "6 Clicks" Daily Activity     Outcome Measure Help from another person eating meals?: None Help from another person taking care of personal grooming?: A Little Help from another person toileting, which includes using toliet, bedpan, or urinal?: A Lot Help from another person bathing (including washing, rinsing, drying)?: A Lot Help from another person to put on and taking off regular upper body clothing?: A Little Help from another person to put on and taking off regular lower body clothing?: A Lot 6 Click Score: 16   End of Session Equipment Utilized During Treatment: Gait belt;Rolling walker Nurse Communication: Mobility status  Activity Tolerance: Patient tolerated treatment well Patient left: in chair;with call bell/phone within reach;with chair alarm set  OT Visit Diagnosis: Unsteadiness on feet (R26.81);Other abnormalities of gait and mobility (R26.89);Muscle weakness (generalized) (M62.81)                 Time: 9470-9628 OT Time Calculation (min): 24 min Charges:  OT General Charges $OT Visit: 1 Visit OT Evaluation $OT Eval Moderate Complexity: 1 Mod OT Treatments $Self Care/Home Management : 8-22 mins  Johnatha Zeidman, OTR/L Acute Care Rehab Services  Office [phone] Pager: [phone]   Kelli Churn 06/17/2020, 10:37 AM

## 2020-06-18 LAB — HEMOGLOBIN AND HEMATOCRIT, BLOOD
HCT: 21.8 % — ABNORMAL LOW (ref 36.0–46.0)
HCT: 26 % — ABNORMAL LOW (ref 36.0–46.0)
HCT: 26.3 % — ABNORMAL LOW (ref 36.0–46.0)
Hemoglobin: 6.8 g/dL — CL (ref 12.0–15.0)
Hemoglobin: 8.3 g/dL — ABNORMAL LOW (ref 12.0–15.0)
Hemoglobin: 8.4 g/dL — ABNORMAL LOW (ref 12.0–15.0)

## 2020-06-18 LAB — PREPARE RBC (CROSSMATCH)

## 2020-06-18 LAB — OCCULT BLOOD X 1 CARD TO LAB, STOOL: Fecal Occult Bld: POSITIVE — AB

## 2020-06-18 MED ORDER — SODIUM CHLORIDE 0.9% IV SOLUTION: Freq: Once | INTRAVENOUS | Status: AC

## 2020-06-18 MED ORDER — CHLORHEXIDINE GLUCONATE CLOTH 2 % EX PADS
6.0000 | MEDICATED_PAD | Freq: Every day | CUTANEOUS | Status: DC
  Administered 2020-06-19 – 2020-06-22 (×3): 6 via TOPICAL

## 2020-06-18 NOTE — Progress Notes (Signed)
PROGRESS NOTE    Candace Cain  EQA:[id number] DOB: 1920-11-12 DOA: 06/12/2020 PCP: Daisy Floro, MD   Brief Narrative: 87 y.o. female with medical history significant for atrial fibrillation on Coumadin,, hypothyroidism, perforated diverticulitis status post sigmoid colectomy and colostomy in 2017, now presenting to the emergency department red blood per rectum.  Patient reports that she was in her usual state and having an uneventful day until approximately 6 PM when she reports passing a large volume of bright red blood on the toilet.  She has not seen any blood in her colostomy bag, has not had any abdominal or rectal pain, denies trauma, and has never experienced this before.  Family at bedside notes that they saw some red blood clots.   She was admitted for rectal bleeding.  This seems to have improved after vitamin K and normalization of INR.   Assessment & Plan:   Principal Problem:   Rectal bleeding Active Problems:   Essential hypertension   Atrial fibrillation (HCC)   Hypothyroidism   Diverticulitis of colon with perforation s/p colectomy/colostomy 12/25/2015    1. Rectal bleeding  Anemia  Abdominal Pain- - Patient on warfarin for a fib and status post sigmoid colectomy and colostomy in 2017 presents with bright red blood per rectum  - Initial Hgb 11.6 hemoglobin down to 6.8.  Coumadin still on hold. We will transfuse 1 unit of packed RBCs today.  Continue to hold Coumadin.  FOBT positive.  GI following.  Discussed with patient's daughter and niece. CT abdomen shows bladder distention will do bladder scanning and in and out catheter to see if she is retaining urine.  renal ultrasound- no hydronephrosis has distended bladder   2. Atrial fibrillation continue to hold Coumadin   3. Hypertension blood pressure soft 119/58 continue to hold antihypertensives.    4 Anemia Follow iron, b12, folate, ferritin - labs c/w AOCD  5 urinary retention bladder scan yesterday  showed urine more than 900 cc.  I and  o was done with 800 cc of urine.  We will bladder scan today and consider repeating I and O if she is retaining urine we will place Foley catheter.  Discussed with her niece.  Estimated body mass index is 22.31 kg/m as calculated from the following:   Height as of this encounter: 5' 1.5" (1.562 m).   Weight as of this encounter: 54.4 kg.  DVT prophylaxis: SCD Code Status: DNR Family Communication: Discussed with the niece Disposition: SNF/ALF   Status is: Observation   The patient will require care spanning > 2 midnights and should be moved to inpatient because: Inpatient level of care appropriate due to severity of illness   Dispo: The patient is from: Home              Anticipated d/c is to: Home              Patient currently is not medically stable to d/c.              Difficult to place patient No           Consultants:  GI   Procedures:  none  Subjective: She is resting in bed colostomy still with dark stools denies any nausea vomiting  Objective: Vitals:   06/17/20 2124 06/18/20 0513 06/18/20 1030 06/18/20 1045  BP: (!) 143/57 (!) 152/69 (!) 131/56 (!) 119/58  Pulse: 64 62 (!) 59 60  Resp: 14 14 16 18   Temp: 98.8 F (37.1 C) 98.2  F (36.8 C) 97.9 F (36.6 C) 97.9 F (36.6 C)  TempSrc: Oral Oral Oral Oral  SpO2: 96% 97%  98%  Weight:      Height:        Intake/Output Summary (Last 24 hours) at 06/18/2020 1221 Last data filed at 06/18/2020 0521 Gross per 24 hour  Intake 120 ml  Output 750 ml  Net -630 ml    Filed Weights   06/12/20 1958  Weight: 54.4 kg    Examination:  General exam: Appears calm and comfortable  Respiratory system: Clear to auscultation. Respiratory effort normal. Cardiovascular system: S1 & S2 heard, RRR. No JVD, murmurs, rubs, gallops or clicks. No pedal edema. Gastrointestinal system: Abdomen is nondistended, soft and nontender. No organomegaly or masses felt. Normal bowel sounds  heard. Central nervous system: Alert and oriented. No focal neurological deficits. Extremities: Symmetric 5 x 5 power. Skin: No rashes, lesions or ulcers Psychiatry: Judgement and insight appear normal. Mood & affect appropriate.     Data Reviewed: I have personally reviewed following labs and imaging studies  CBC: Recent Labs  Lab 06/12/20 2021 06/13/20 0044 06/14/20 0023 06/14/20 1245 06/15/20 [id number] 06/15/20 1301 06/16/20 0525 06/16/20 1256 06/16/20 2050 06/17/20 0558 06/17/20 1236 06/17/20 2117 06/18/20 0547  WBC 6.1  --  5.9  --  5.1  --  6.1  --   --   --   --   --   --   NEUTROABS 4.8  --  3.8  --  3.3  --  3.9  --   --   --   --   --   --   HGB 11.6*   < > 8.0*   < > 7.4*   < > 7.5*   < > 7.7* 7.5* 8.0* 7.4* 6.8*  HCT 36.9   < > 25.7*   < > 24.1*   < > 24.9*   < > 25.0* 24.0* 25.7* 23.6* 21.8*  MCV 93.2  --  94.8  --  96.4  --  98.4  --   --   --   --   --   --   PLT 247  --  203  --  207  --  210  --   --   --   --   --   --    < > = values in this interval not displayed.    Basic Metabolic Panel: Recent Labs  Lab 06/12/20 2021 06/13/20 0445 06/14/20 0023 06/15/20 0648 06/16/20 0525  NA 137 139 137 140 137  K 4.6 3.6 3.6 3.5 3.6  CL 101 107 108 110 108  CO2 28 26 24 25 22   GLUCOSE 95 96 90 90 96  BUN 29* 24* 23 32* 23  CREATININE 0.82 0.42* 0.57 0.66 0.71  CALCIUM 9.3 8.7* 8.3* 8.6* 8.6*  MG  --   --  1.8 1.8 1.9  PHOS  --   --  2.6 2.7 2.9    GFR: Estimated Creatinine Clearance: 31.1 mL/min (by C-G formula based on SCr of 0.71 mg/dL). Liver Function Tests: Recent Labs  Lab 06/12/20 2021 06/14/20 0023 06/15/20 0648 06/16/20 0525  AST 29 15 14* 15  ALT 12 10 10 10   ALKPHOS 57 44 44 43  BILITOT 0.6 0.7 0.5 0.6  PROT 7.1 5.5* 5.5* 5.2*  ALBUMIN 4.2 3.2* 3.3* 3.0*    No results for input(s): LIPASE, AMYLASE in the last 168 hours. No results for input(s): AMMONIA in the  last 168 hours. Coagulation Profile: Recent Labs  Lab 06/12/20 2021  06/13/20 0445 06/14/20 0023 06/15/20 0648  INR 2.2* 2.4* 2.5* 1.2    Cardiac Enzymes: No results for input(s): CKTOTAL, CKMB, CKMBINDEX, TROPONINI in the last 168 hours. BNP (last 3 results) No results for input(s): PROBNP in the last 8760 hours. HbA1C: No results for input(s): HGBA1C in the last 72 hours. CBG: No results for input(s): GLUCAP in the last 168 hours. Lipid Profile: No results for input(s): CHOL, HDL, LDLCALC, TRIG, CHOLHDL, LDLDIRECT in the last 72 hours. Thyroid Function Tests: No results for input(s): TSH, T4TOTAL, FREET4, T3FREE, THYROIDAB in the last 72 hours. Anemia Panel: No results for input(s): VITAMINB12, FOLATE, FERRITIN, TIBC, IRON, RETICCTPCT in the last 72 hours.  Sepsis Labs: No results for input(s): PROCALCITON, LATICACIDVEN in the last 168 hours.  Recent Results (from the past 240 hour(s))  Resp Panel by RT-PCR (Flu A&B, Covid) Nasopharyngeal Swab     Status: None   Collection Time: 06/12/20 10:44 PM   Specimen: Nasopharyngeal Swab; Nasopharyngeal(NP) swabs in vial transport medium  Result Value Ref Range Status   SARS Coronavirus 2 by RT PCR NEGATIVE NEGATIVE Final    Comment: (NOTE) SARS-CoV-2 target nucleic acids are NOT DETECTED.  The SARS-CoV-2 RNA is generally detectable in upper respiratory specimens during the acute phase of infection. The lowest concentration of SARS-CoV-2 viral copies this assay can detect is 138 copies/mL. A negative result does not preclude SARS-Cov-2 infection and should not be used as the sole basis for treatment or other patient management decisions. A negative result may occur with  improper specimen collection/handling, submission of specimen other than nasopharyngeal swab, presence of viral mutation(s) within the areas targeted by this assay, and inadequate number of viral copies(<138 copies/mL). A negative result must be combined with clinical observations, patient history, and epidemiological information.  The expected result is Negative.  Fact Sheet for Patients:  BloggerCourse.com  Fact Sheet for Healthcare Providers:  SeriousBroker.it  This test is no t yet approved or cleared by the [place] FDA and  has been authorized for detection and/or diagnosis of SARS-CoV-2 by FDA under an Emergency Use Authorization (EUA). This EUA will remain  in effect (meaning this test can be used) for the duration of the COVID-19 declaration under Section 564(b)(1) of the Act, 21 U.S.C.section 360bbb-3(b)(1), unless the authorization is terminated  or revoked sooner.       Influenza A by PCR NEGATIVE NEGATIVE Final   Influenza B by PCR NEGATIVE NEGATIVE Final    Comment: (NOTE) The Xpert Xpress SARS-CoV-2/FLU/RSV plus assay is intended as an aid in the diagnosis of influenza from Nasopharyngeal swab specimens and should not be used as a sole basis for treatment. Nasal washings and aspirates are unacceptable for Xpert Xpress SARS-CoV-2/FLU/RSV testing.  Fact Sheet for Patients: BloggerCourse.com  Fact Sheet for Healthcare Providers: SeriousBroker.it  This test is not yet approved or cleared by the [place] FDA and has been authorized for detection and/or diagnosis of SARS-CoV-2 by FDA under an Emergency Use Authorization (EUA). This EUA will remain in effect (meaning this test can be used) for the duration of the COVID-19 declaration under Section 564(b)(1) of the Act, 21 U.S.C. section 360bbb-3(b)(1), unless the authorization is terminated or revoked.  Performed at [facility], [address] [place], [place] [address]          Radiology Studies: CT ABDOMEN PELVIS WO CONTRAST  Result Date: 06/16/2020 CLINICAL DATA:  Right lower  quadrant abdominal pain, lower gastrointestinal hemorrhage EXAM: CT ABDOMEN AND PELVIS WITHOUT CONTRAST TECHNIQUE: Multidetector CT  imaging of the abdomen and pelvis was performed following the standard protocol without IV contrast. COMPARISON:  12/31/2015 FINDINGS: Lower chest: The visualized mild cylindrical bronchiectasis and subpleural fibrosis is noted within the visualized lung bases. Extensive multi-vessel coronary artery calcification is noted. Global cardiac size within normal limits. Central pulmonary arteries are enlarged in keeping with changes of pulmonary arterial hypertension. No pericardial effusion. Moderate atherosclerotic calcification noted within the thoracic aorta without evidence of aneurysm Hepatobiliary: No focal liver abnormality is seen. No gallstones, gallbladder wall thickening, or biliary dilatation. Pancreas: Unremarkable Spleen: Unremarkable Adrenals/Urinary Tract: The adrenal glands are unremarkable. The kidneys are normal. The bladder is markedly distended. Streak artifact related to right total hip arthroplasty slightly limits evaluation of the bladder. Stomach/Bowel: Large left lower quadrant spigelian hernia contains the distal descending and proximal sigmoid colon without evidence of obstruction. Small supraumbilical ventral hernia contains a single loop of unremarkable mid small bowel. There is severe distal sigmoid diverticulosis without superimposed inflammatory change. The stomach, small bowel, and large bowel are otherwise unremarkable. Appendix normal. No free intraperitoneal gas or fluid. Vascular/Lymphatic: There is moderate aortoiliac atherosclerotic calcification with particularly prominent atherosclerotic calcifications seen within the a superior mesenteric artery proximally as well as the or right renal artery origin. The degree of stenosis is not well assessed on this noncontrast examination. No aortic aneurysm. No pathologic adenopathy within the abdomen and pelvis. Reproductive: Status post hysterectomy. No adnexal masses. Other: Small left inguinal lipoma.  The rectum is unremarkable.  Musculoskeletal: Right total hip arthroplasty has been performed. No acute bone abnormality. Osseous structures are age-appropriate. IMPRESSION: Marked distension of the bladder, possibly reflecting changes of bladder outlet obstruction. Severe distal sigmoid diverticulosis without superimposed inflammatory change. Small supraumbilical ventral hernia and large left lower quadrant spigelian hernia containing loops of small and large bowel, respectively without evidence of obstruction. Extensive multi-vessel coronary artery calcification. Morphologic changes in keeping with pulmonary arterial hypertension. Mild bibasilar cylindrical bronchiectasis and subpleural fibrosis. Peripheral vascular disease. If there is clinical evidence of chronic mesenteric ischemia or hemodynamically significant renal artery stenosis, CT arteriography may be more helpful for further evaluation. Aortic Atherosclerosis (ICD10-I70.0). Electronically Signed   By: Helyn Numbers MD   On: 06/16/2020 20:44   US RENAL  Result Date: 06/17/2020 CLINICAL DATA:  Abdominal distension. EXAM: RENAL / URINARY TRACT ULTRASOUND COMPLETE COMPARISON:  None. FINDINGS: Right Kidney: Renal measurements: 9.8 cm x 4.4 cm x 4.0 cm = volume: 90.6 mL. Echogenicity within normal limits. No mass or hydronephrosis visualized. Left Kidney: Renal measurements: 8.5 cm x 4.7 cm x 4.8 cm = volume: 99.2 mL. Echogenicity within normal limits. No mass or hydronephrosis visualized. Bladder: The urinary bladder is distended. Other: None. IMPRESSION: 1. Distended urinary bladder. 2. Otherwise unremarkable renal ultrasound. Electronically Signed   By: Aram Candela M.D.   On: 06/17/2020 18:54         Scheduled Meds: Continuous Infusions:   LOS: 5 days     Alwyn Ren, MD  06/18/2020, 12:21 PM

## 2020-06-18 NOTE — Plan of Care (Signed)
Patient was able to sleep through the night; no s/s of acute distress or pain reported or observed; call light within reach and bed at lowest position for safety.

## 2020-06-18 NOTE — Progress Notes (Signed)
Pt with critical 6.8 hgb; Dr. Arville Care called at 06.38am and he recommended to wait for am MD before placing a transfusion order. Pt's type and screen expired on 06/15/2020 new one to ordered; note from Dr.Magod PA to transfused for hbd <7. Will confirm with oncoming RN.

## 2020-06-18 NOTE — Progress Notes (Signed)
Candace Cain 11:02 AM  Subjective: Patient seen and examined and is in good spirits and is not having any obvious bleeding and no new complaints  Objective: Vital signs stable afebrile slight decrease in hemoglobin abdomen is soft nontender no obvious blood in ostomy bag  Assessment: Seemingly resolved diverticular bleeding Plan: Case discussed with the patient and her daughter will be on standby to help as needed and happy to see back as an outpatient  [facility]  office [phone] After 5PM or if no answer call [phone]

## 2020-06-19 ENCOUNTER — Inpatient Hospital Stay (HOSPITAL_COMMUNITY): Payer: Medicare Other

## 2020-06-19 DIAGNOSIS — R609 Edema, unspecified: Secondary | ICD-10-CM

## 2020-06-19 LAB — HEMOGLOBIN AND HEMATOCRIT, BLOOD
HCT: 27.6 % — ABNORMAL LOW (ref 36.0–46.0)
HCT: 27.3 % — ABNORMAL LOW (ref 36.0–46.0)
Hemoglobin: 8.8 g/dL — ABNORMAL LOW (ref 12.0–15.0)
Hemoglobin: 8.5 g/dL — ABNORMAL LOW (ref 12.0–15.0)

## 2020-06-19 LAB — CBC
HCT: 24.1 % — ABNORMAL LOW (ref 36.0–46.0)
Hemoglobin: 7.9 g/dL — ABNORMAL LOW (ref 12.0–15.0)
MCH: 30 pg (ref 26.0–34.0)
MCHC: 32.8 g/dL (ref 30.0–36.0)
MCV: 91.6 fL (ref 80.0–100.0)
Platelets: 239 10*3/uL (ref 150–400)
RBC: 2.63 MIL/uL — ABNORMAL LOW (ref 3.87–5.11)
RDW: 15.7 % — ABNORMAL HIGH (ref 11.5–15.5)
WBC: 7.1 10*3/uL (ref 4.0–10.5)
nRBC: 0 % (ref 0.0–0.2)

## 2020-06-19 LAB — TYPE AND SCREEN
ABO/RH(D): A POS
Antibody Screen: NEGATIVE
Unit division: 0

## 2020-06-19 LAB — COMPREHENSIVE METABOLIC PANEL
ALT: 10 U/L (ref 0–44)
AST: 18 U/L (ref 15–41)
Albumin: 2.9 g/dL — ABNORMAL LOW (ref 3.5–5.0)
Alkaline Phosphatase: 43 U/L (ref 38–126)
Anion gap: 7 (ref 5–15)
BUN: 23 mg/dL (ref 8–23)
CO2: 26 mmol/L (ref 22–32)
Calcium: 8.5 mg/dL — ABNORMAL LOW (ref 8.9–10.3)
Chloride: 105 mmol/L (ref 98–111)
Creatinine, Ser: 0.47 mg/dL (ref 0.44–1.00)
GFR, Estimated: >60 mL/min (ref >60)
Glucose, Bld: 91 mg/dL (ref 70–99)
Potassium: 3.9 mmol/L (ref 3.5–5.1)
Sodium: 138 mmol/L (ref 135–145)
Total Bilirubin: 0.5 mg/dL (ref 0.3–1.2)
Total Protein: 5.6 g/dL — ABNORMAL LOW (ref 6.5–8.1)

## 2020-06-19 LAB — BPAM RBC
Blood Product Expiration Date: 202206262359
ISSUE DATE / TIME: 202206091025
Unit Type and Rh: 6200

## 2020-06-19 MED ORDER — PANTOPRAZOLE SODIUM 20 MG PO TBEC
20.0000 mg | DELAYED_RELEASE_TABLET | Freq: Every day | ORAL | Status: DC
  Administered 2020-06-20 – 2020-06-22 (×3): 20 mg via ORAL
  Filled 2020-06-19 (×3): qty 1

## 2020-06-19 MED ORDER — ACETAMINOPHEN 325 MG PO TABS
650.0000 mg | ORAL_TABLET | Freq: Four times a day (QID) | ORAL | Status: AC | PRN
Start: 2020-06-19 — End: ?

## 2020-06-19 MED ORDER — ONDANSETRON HCL 4 MG PO TABS
4.0000 mg | ORAL_TABLET | Freq: Four times a day (QID) | ORAL | 0 refills | Status: AC | PRN
Start: 2020-06-19 — End: ?

## 2020-06-19 NOTE — Progress Notes (Signed)
PROGRESS NOTE    Candace Cain  NGE:[id number] DOB: December 26, 1920 DOA: 06/12/2020 PCP: Daisy Florooss, Charles Alan, MD   Brief Narrative: 87 y.o. female with medical history significant for atrial fibrillation on Coumadin,, hypothyroidism, perforated diverticulitis status post sigmoid colectomy and colostomy in 2017, now presenting to the emergency department red blood per rectum.  Patient reports that she was in her usual state and having an uneventful day until approximately 6 PM when she reports passing a large volume of bright red blood on the toilet.  She has not seen any blood in her colostomy bag, has not had any abdominal or rectal pain, denies trauma, and has never experienced this before.  Family at bedside notes that they saw some red blood clots.   She was admitted for rectal bleeding.  This seems to have improved after vitamin K and normalization of INR.   Assessment & Plan:   Principal Problem:   Rectal bleeding Active Problems:   Essential hypertension   Atrial fibrillation (HCC)   Hypothyroidism   Diverticulitis of colon with perforation s/p colectomy/colostomy 12/25/2015    1. Rectal bleeding  Anemia  Abdominal Pain- - Patient on warfarin for a fib and status post sigmoid colectomy and colostomy in 2017 presents with bright red blood per rectum  - Initial Hgb 11.6 hemoglobin down to 6.8.  Coumadin still on hold. We will transfuse 1 unit of packed RBCs today.  Continue to hold Coumadin.  FOBT positive.  Hb 7.9 still with black stools,need to monitor closely   GI following.   Discussed with patient's daughter and niece. CT abdomen shows bladder distention will do bladder scanning and in and out catheter to see if she is retaining urine.  renal ultrasound- no hydronephrosis has distended bladder   2. Atrial fibrillation continue to hold Coumadin   3. Hypertension blood pressure soft 119/58 continue to hold antihypertensives.    4 Anemia Follow iron, b12, folate, ferritin - labs  c/w AOCD  5 urinary retention bladder scan yesterday showed urine more than 900 cc.  I and  o was done with 800 cc of urine.  We will bladder scan today and consider repeating I and O if she is retaining urine we will place Foley catheter.  Discussed with her niece.  Estimated body mass index is 22.31 kg/m as calculated from the following:   Height as of this encounter: 5' 1.5" (1.562 m).   Weight as of this encounter: 54.4 kg.  DVT prophylaxis: SCD Code Status: DNR Family Communication: Discussed with the niece Disposition: SNF/ALF   Status is: inpatient   The patient will require care spanning > 2 midnights and should be moved to inpatient because: Inpatient level of care appropriate due to severity of illness   Dispo: The patient is from: Home              Anticipated d/c is to: Home              Patient currently is not medically stable to d/c.              Difficult to place patient No    Consultants:  GI   Procedures:  none  Subjective: She is resting in bed colostomy still with dark stools denies any nausea vomiting  Objective: Vitals:   06/18/20 1330 06/18/20 2053 06/19/20 0503 06/19/20 1245  BP: (!) 131/57 (!) 157/88 (!) 183/81 (!) 142/70  Pulse: 62 (!) 56 (!) 59 (!) 46  Resp: 18 14 14  16  Temp: 97.7 F (36.5 C) 98.8 F (37.1 C) 97.9 F (36.6 C) 98.5 F (36.9 C)  TempSrc: Oral Oral Oral Oral  SpO2: 94% 93% 94%   Weight:      Height:        Intake/Output Summary (Last 24 hours) at 06/19/2020 1326 Last data filed at 06/19/2020 1000 Gross per 24 hour  Intake 810 ml  Output 1000 ml  Net -190 ml    Filed Weights   06/12/20 1958  Weight: 54.4 kg    Examination:  General exam: Appears calm and comfortable  Respiratory system: Clear to auscultation. Respiratory effort normal. Cardiovascular system: S1 & S2 heard, RRR. No JVD, murmurs, rubs, gallops or clicks. No pedal edema. Gastrointestinal system: Abdomen is nondistended, soft and nontender. No  organomegaly or masses felt. Normal bowel sounds heard. Central nervous system: Alert and oriented. No focal neurological deficits. Extremities: Symmetric 5 x 5 power. Skin: No rashes, lesions or ulcers Psychiatry: Judgement and insight appear normal. Mood & affect appropriate.     Data Reviewed: I have personally reviewed following labs and imaging studies  CBC: Recent Labs  Lab 06/12/20 2021 06/13/20 0044 06/14/20 0023 06/14/20 1245 06/15/20 9702 06/15/20 1301 06/16/20 0525 06/16/20 1256 06/17/20 2117 06/18/20 0547 06/18/20 1517 06/18/20 2039 06/19/20 0540  WBC 6.1  --  5.9  --  5.1  --  6.1  --   --   --   --   --  7.1  NEUTROABS 4.8  --  3.8  --  3.3  --  3.9  --   --   --   --   --   --   HGB 11.6*   < > 8.0*   < > 7.4*   < > 7.5*   < > 7.4* 6.8* 8.3* 8.4* 7.9*  HCT 36.9   < > 25.7*   < > 24.1*   < > 24.9*   < > 23.6* 21.8* 26.0* 26.3* 24.1*  MCV 93.2  --  94.8  --  96.4  --  98.4  --   --   --   --   --  91.6  PLT 247  --  203  --  207  --  210  --   --   --   --   --  239   < > = values in this interval not displayed.    Basic Metabolic Panel: Recent Labs  Lab 06/13/20 0445 06/14/20 0023 06/15/20 0648 06/16/20 0525 06/19/20 0540  NA 139 137 140 137 138  K 3.6 3.6 3.5 3.6 3.9  CL 107 108 110 108 105  CO2 26 24 25 22 26   GLUCOSE 96 90 90 96 91  BUN 24* 23 32* 23 23  CREATININE 0.42* 0.57 0.66 0.71 0.47  CALCIUM 8.7* 8.3* 8.6* 8.6* 8.5*  MG  --  1.8 1.8 1.9  --   PHOS  --  2.6 2.7 2.9  --     GFR: Estimated Creatinine Clearance: 31.1 mL/min (by C-G formula based on SCr of 0.47 mg/dL). Liver Function Tests: Recent Labs  Lab 06/12/20 2021 06/14/20 0023 06/15/20 0648 06/16/20 0525 06/19/20 0540  AST 29 15 14* 15 18  ALT 12 10 10 10 10   ALKPHOS 57 44 44 43 43  BILITOT 0.6 0.7 0.5 0.6 0.5  PROT 7.1 5.5* 5.5* 5.2* 5.6*  ALBUMIN 4.2 3.2* 3.3* 3.0* 2.9*    No results for input(s): LIPASE, AMYLASE in the last 168  hours. No results for input(s):  AMMONIA in the last 168 hours. Coagulation Profile: Recent Labs  Lab 06/12/20 2021 06/13/20 0445 06/14/20 0023 06/15/20 0648  INR 2.2* 2.4* 2.5* 1.2    Cardiac Enzymes: No results for input(s): CKTOTAL, CKMB, CKMBINDEX, TROPONINI in the last 168 hours. BNP (last 3 results) No results for input(s): PROBNP in the last 8760 hours. HbA1C: No results for input(s): HGBA1C in the last 72 hours. CBG: No results for input(s): GLUCAP in the last 168 hours. Lipid Profile: No results for input(s): CHOL, HDL, LDLCALC, TRIG, CHOLHDL, LDLDIRECT in the last 72 hours. Thyroid Function Tests: No results for input(s): TSH, T4TOTAL, FREET4, T3FREE, THYROIDAB in the last 72 hours. Anemia Panel: No results for input(s): VITAMINB12, FOLATE, FERRITIN, TIBC, IRON, RETICCTPCT in the last 72 hours.  Sepsis Labs: No results for input(s): PROCALCITON, LATICACIDVEN in the last 168 hours.  Recent Results (from the past 240 hour(s))  Resp Panel by RT-PCR (Flu A&B, Covid) Nasopharyngeal Swab     Status: None   Collection Time: 06/12/20 10:44 PM   Specimen: Nasopharyngeal Swab; Nasopharyngeal(NP) swabs in vial transport medium  Result Value Ref Range Status   SARS Coronavirus 2 by RT PCR NEGATIVE NEGATIVE Final    Comment: (NOTE) SARS-CoV-2 target nucleic acids are NOT DETECTED.  The SARS-CoV-2 RNA is generally detectable in upper respiratory specimens during the acute phase of infection. The lowest concentration of SARS-CoV-2 viral copies this assay can detect is 138 copies/mL. A negative result does not preclude SARS-Cov-2 infection and should not be used as the sole basis for treatment or other patient management decisions. A negative result may occur with  improper specimen collection/handling, submission of specimen other than nasopharyngeal swab, presence of viral mutation(s) within the areas targeted by this assay, and inadequate number of viral copies(<138 copies/mL). A negative result must be  combined with clinical observations, patient history, and epidemiological information. The expected result is Negative.  Fact Sheet for Patients:  BloggerCourse.com  Fact Sheet for Healthcare Providers:  SeriousBroker.it  This test is no t yet approved or cleared by the [place] FDA and  has been authorized for detection and/or diagnosis of SARS-CoV-2 by FDA under an Emergency Use Authorization (EUA). This EUA will remain  in effect (meaning this test can be used) for the duration of the COVID-19 declaration under Section 564(b)(1) of the Act, 21 U.S.C.section 360bbb-3(b)(1), unless the authorization is terminated  or revoked sooner.       Influenza A by PCR NEGATIVE NEGATIVE Final   Influenza B by PCR NEGATIVE NEGATIVE Final    Comment: (NOTE) The Xpert Xpress SARS-CoV-2/FLU/RSV plus assay is intended as an aid in the diagnosis of influenza from Nasopharyngeal swab specimens and should not be used as a sole basis for treatment. Nasal washings and aspirates are unacceptable for Xpert Xpress SARS-CoV-2/FLU/RSV testing.  Fact Sheet for Patients: BloggerCourse.com  Fact Sheet for Healthcare Providers: SeriousBroker.it  This test is not yet approved or cleared by the [place] FDA and has been authorized for detection and/or diagnosis of SARS-CoV-2 by FDA under an Emergency Use Authorization (EUA). This EUA will remain in effect (meaning this test can be used) for the duration of the COVID-19 declaration under Section 564(b)(1) of the Act, 21 U.S.C. section 360bbb-3(b)(1), unless the authorization is terminated or revoked.  Performed at [facility], [address], [place], [place] [address]          Radiology Studies: US RENAL  Result Date: 06/17/2020  CLINICAL DATA:  Abdominal distension. EXAM: RENAL / URINARY TRACT ULTRASOUND COMPLETE  COMPARISON:  None. FINDINGS: Right Kidney: Renal measurements: 9.8 cm x 4.4 cm x 4.0 cm = volume: 90.6 mL. Echogenicity within normal limits. No mass or hydronephrosis visualized. Left Kidney: Renal measurements: 8.5 cm x 4.7 cm x 4.8 cm = volume: 99.2 mL. Echogenicity within normal limits. No mass or hydronephrosis visualized. Bladder: The urinary bladder is distended. Other: None. IMPRESSION: 1. Distended urinary bladder. 2. Otherwise unremarkable renal ultrasound. Electronically Signed   By: Aram Candela M.D.   On: 06/17/2020 18:54         Scheduled Meds:  Chlorhexidine Gluconate Cloth  6 each Topical Daily   Continuous Infusions:   LOS: 6 days     Alwyn Ren, MD  06/19/2020, 1:26 PM

## 2020-06-19 NOTE — Progress Notes (Signed)
Occupational Therapy Treatment Patient Details Name: Candace Cain MRN: [id number] DOB: [birth year] Today's Date: 06/19/2020    History of present illness 87 yo female admitted with rectal bleeding, abd pain. Hx of PAF, R hip hemiarthroplasty 2016, bradycardia, hypothyroidism, colectomy 2017   OT comments  Patient agreeable to try ambulating to sink for bathing task. Patient min A for for bed mobility with increased time to scoot toward edge of bed. First attempt unable to weight shift forward and come to standing position with mod A. Patient needing 2 more attempts, bed height elevated and cues for body mechanics to improve base of support and anterior weight shift to stand from edge of bed. Due to poor standing balance, need for mod A and assist turning walker deferred sink side task and patient total A in standing to wash buttock. Patient also with poor eccentric control into chair needing cues for hand placement and mod A.    Follow Up Recommendations  SNF    Equipment Recommendations  None recommended by OT       Precautions / Restrictions Precautions Precautions: Fall Precaution Comments: HOH       Mobility Bed Mobility Overal bed mobility: Needs Assistance Bed Mobility: Supine to Sit     Supine to sit: Min assist;HOB elevated     General bed mobility comments: for trunk support    Transfers Overall transfer level: Needs assistance Equipment used: Rolling walker (2 wheeled) Transfers: Sit to/from [employer] Sit to Stand: Mod assist;From elevated surface Stand pivot transfers: Mod assist       General transfer comment: needing 3 attempts, bed height raised and mod A to come to full standing with multiple cues for body mechanics as patient keeps feet too far forward when attempting to stand. Posterior lean did improve with taking small steps towards recliner however still mod A for walker management and balance    Balance Overall balance assessment:  Needs assistance Sitting-balance support: Feet supported Sitting balance-Leahy Scale: Fair   Postural control: Posterior lean Standing balance support: Bilateral upper extremity supported;During functional activity Standing balance-Leahy Scale: Poor                             ADL either performed or assessed with clinical judgement   ADL Overall ADL's : Needs assistance/impaired                         Toilet Transfer: Moderate assistance;Cueing for safety;Cueing for sequencing;Stand-pivot;RW Toilet Transfer Details (indicate cue type and reason): patient needing multiple attempts and elevated bed height to stand from edge of bed. Patient with strong posterior lean initially needing cues to bring legs underneath her more when trying to stand. patient having difficulty weight shifting and managing walker needing mod A for balance and to advance walker towards recliner chair. Toileting- Clothing Manipulation and Hygiene: Total assistance;Sit to/from stand Toileting - Clothing Manipulation Details (indicate cue type and reason): patient able to tolerate standing for additional 30 seconds at recliner chair while OT perform perianal care as there appeared to be some stool leakage on bed pad despite patient having colostomy     Functional mobility during ADLs: Moderate assistance;Cueing for safety;Cueing for sequencing;Rolling walker General ADL Comments: patient presents with significant weakness needing assistance for functional transfers and limited activity tolerance, balance      Cognition Arousal/Alertness: Awake/alert Behavior During Therapy: WFL for tasks assessed/performed Overall Cognitive Status: Within Functional  Limits for tasks assessed                                                     Pertinent Vitals/ Pain       Pain Assessment: Faces Faces Pain Scale: Hurts little more Pain Location: L flank Pain Descriptors / Indicators:  Discomfort Pain Intervention(s): Premedicated before session;Monitored during session         Frequency  Min 2X/week        Progress Toward Goals  OT Goals(current goals can now be found in the care plan section)  Progress towards OT goals: Progressing toward goals  Acute Rehab OT Goals Patient Stated Goal: back to Ind Living OT Goal Formulation: With patient Time For Goal Achievement: 07/01/20 Potential to Achieve Goals: Good ADL Goals Pt Will Perform Lower Body Dressing: with supervision;sit to/from stand Pt Will Transfer to Toilet: with supervision;regular height toilet;grab bars;ambulating Pt Will Perform Toileting - Clothing Manipulation and hygiene: with supervision;sit to/from stand Additional ADL Goal #1: Patient will stand at sink to perform grooming task as evidence of improving activity tolerance and balance.  Plan Discharge plan remains appropriate       AM-PAC OT "6 Clicks" Daily Activity     Outcome Measure   Help from another person eating meals?: None Help from another person taking care of personal grooming?: A Little Help from another person toileting, which includes using toliet, bedpan, or urinal?: A Lot Help from another person bathing (including washing, rinsing, drying)?: A Lot Help from another person to put on and taking off regular upper body clothing?: A Little Help from another person to put on and taking off regular lower body clothing?: A Lot 6 Click Score: 16    End of Session Equipment Utilized During Treatment: Gait belt;Rolling walker  OT Visit Diagnosis: Unsteadiness on feet (R26.81);Other abnormalities of gait and mobility (R26.89);Muscle weakness (generalized) (M62.81)   Activity Tolerance Patient limited by fatigue   Patient Left in chair;with call bell/phone within reach;with family/visitor present   Nurse Communication Mobility status        Time: 1014-1030 OT Time Calculation (min): 16 min  Charges: OT General  Charges $OT Visit: 1 Visit OT Treatments $Self Care/Home Management : 8-22 mins  Candace Cain OT OT pager: [phone]   Candace Cain 06/19/2020, 12:07 PM

## 2020-06-19 NOTE — Progress Notes (Signed)
BLE venous duplex has been completed.  Results can be found under chart review under CV PROC. 06/19/2020 2:56 PM Adlyn Fife RVT, RDMS

## 2020-06-19 NOTE — Progress Notes (Signed)
[facility] Progress Note  Candace Cain 87 y.o. Jun 18, 1920  CC:  Rectal bleeding   Subjective: Patient with history of post sigmoid colectomy and colostomy in 2017 for perforated diverticulitis, with atrial fibrillation on Coumadin.  This was held and she was given vitamin K, INR today is at 1.2.  Patient has soft brown stool without any visible blood in colostomy bag today, did have a hemoccult card that was positive yesterday.  After being moved from bed to chair today patient did have some nausea with dry heaving. Denies fever or chills. Is complaining of some right leg swelling and some left upper flank abdominal discomfort right above colostomy bag worse with a deep breath that is intermittent. Denies shortness of breath or chest pain. Tolerated food well last night   Lab Results  Component Value Date   INR 1.2 06/15/2020   INR 2.5 (H) 06/14/2020   INR 2.4 (H) 06/13/2020     ROS : Review of Systems  Constitutional:  Negative for chills and fever.  HENT:  Positive for hearing loss.   Respiratory:  Negative for cough and shortness of breath.   Cardiovascular:  Positive for leg swelling. Negative for chest pain.  Gastrointestinal:  Positive for abdominal pain (left upper AB), blood in stool (hemmocult positive) and nausea. Negative for constipation, diarrhea, heartburn, melena and vomiting.  Musculoskeletal:  Negative for falls.  Neurological:  Negative for loss of consciousness.  Endo/Heme/Allergies:  Bruises/bleeds easily.  Psychiatric/Behavioral:  Negative for memory loss.    Objective: Vital signs in last 24 hours: Vitals:   06/18/20 2053 06/19/20 0503  BP: (!) 157/88 (!) 183/81  Pulse: (!) 56 (!) 59  Resp: 14 14  Temp: 98.8 F (37.1 C) 97.9 F (36.6 C)  SpO2: 93% 94%    Physical Exam: Physical Exam Constitutional:      Comments: Thin appearing female younger than stated age in no acute distress.  HENT:     Head: Normocephalic.  Eyes:      Conjunctiva/sclera: Conjunctivae normal.  Abdominal:     Comments: Left lower quadrant colostomy with brown stool, no blood visible blood. Abdomen soft, some mild left upper quadrant right above the colostomy, no rebound .   Musculoskeletal:        General: No swelling.  Skin:    General: Skin is warm.     Comments: Ecchymosis bilateral arm  Neurological:     General: No focal deficit present.     Mental Status: Mental status is at baseline.  Psychiatric:        Mood and Affect: Mood normal.     Lab Results: Recent Labs    06/19/20 0540  NA 138  K 3.9  CL 105  CO2 26  GLUCOSE 91  BUN 23  CREATININE 0.47  CALCIUM 8.5*    Recent Labs    06/19/20 0540  AST 18  ALT 10  ALKPHOS 43  BILITOT 0.5  PROT 5.6*  ALBUMIN 2.9*    Recent Labs    06/18/20 2039 06/19/20 0540  WBC  --  7.1  HGB 8.4* 7.9*  HCT 26.3* 24.1*  MCV  --  91.6  PLT  --  239    No results for input(s): LABPROT, INR in the last 72 hours.   Lab Results: Results for orders placed or performed during the hospital encounter of 06/12/20 (from the past 48 hour(s))  Hemoglobin and hematocrit, blood     Status: Abnormal   Collection Time: 06/17/20 12:36  PM  Result Value Ref Range   Hemoglobin 8.0 (L) 12.0 - 15.0 g/dL   HCT 99.3 (L) 71.6 - 96.7 %    Comment: Performed at [facility], [address], [place], [place] [address]  Urinalysis, Routine w reflex microscopic Urine, Clean Catch     Status: None   Collection Time: 06/17/20  6:18 PM  Result Value Ref Range   Color, Urine YELLOW YELLOW   APPearance CLEAR CLEAR   Specific Gravity, Urine 1.013 1.005 - 1.030   pH 5.0 5.0 - 8.0   Glucose, UA NEGATIVE NEGATIVE mg/dL   Hgb urine dipstick NEGATIVE NEGATIVE   Bilirubin Urine NEGATIVE NEGATIVE   Ketones, ur NEGATIVE NEGATIVE mg/dL   Protein, ur NEGATIVE NEGATIVE mg/dL   Nitrite NEGATIVE NEGATIVE   Leukocytes,Ua NEGATIVE NEGATIVE    Comment: Performed at [facility], [address] [place], [place] [address]  Hemoglobin and hematocrit, blood     Status: Abnormal   Collection Time: 06/17/20  9:17 PM  Result Value Ref Range   Hemoglobin 7.4 (L) 12.0 - 15.0 g/dL   HCT 02.5 (L) 85.2 - 77.8 %    Comment: Performed at [facility], [address], [place], [place] [address]  Hemoglobin and hematocrit, blood     Status: Abnormal   Collection Time: 06/18/20  5:47 AM  Result Value Ref Range   Hemoglobin 6.8 (LL) 12.0 - 15.0 g/dL    Comment: This critical result has verified and been called to [facility] R. by Daryll Brod on 06 09 2022 at 0633, and has been read back.    HCT 21.8 (L) 36.0 - 46.0 %    Comment: Performed at [facility], [address], [place], [place] [address]  Prepare RBC (crossmatch)     Status: None   Collection Time: 06/18/20  7:34 AM  Result Value Ref Range   Order Confirmation      ORDER PROCESSED BY BLOOD BANK Performed at [facility], [address], [place], [place] [address]   Type and screen [facility]     Status: None   Collection Time: 06/18/20  7:34 AM  Result Value Ref Range   ABO/RH(D) A POS    Antibody Screen NEG    Sample Expiration 06/21/2020,2359    Unit Number [id number]    Blood Component Type RED CELLS,LR    Unit division 00    Status of Unit ISSUED,FINAL    Transfusion Status OK TO TRANSFUSE    Crossmatch Result      Compatible Performed at [facility], [address], [place], [place] [address]   Occult blood card to lab, stool     Status: Abnormal   Collection Time: 06/18/20 11:15 AM  Result Value Ref Range   Fecal Occult Bld POSITIVE (A) NEGATIVE    Comment: Performed at [facility], [address], [place], [place] [address]  Hemoglobin and hematocrit, blood     Status: Abnormal   Collection Time: 06/18/20  3:17 PM  Result Value Ref Range   Hemoglobin 8.3 (L) 12.0 - 15.0  g/dL   HCT 33.8 (L) 25.0 - 53.9 %    Comment: Performed at [facility], [address] [place], [place] [address]  Hemoglobin and hematocrit, blood     Status: Abnormal   Collection Time: 06/18/20  8:39 PM  Result Value Ref Range   Hemoglobin 8.4 (L)  12.0 - 15.0 g/dL   HCT 14.4 (L) 81.8 - 56.3 %    Comment: Performed at [facility], [address], [place], [place] [address]  CBC     Status: Abnormal   Collection Time: 06/19/20  5:40 AM  Result Value Ref Range   WBC 7.1 4.0 - 10.5 K/uL   RBC 2.63 (L) 3.87 - 5.11 MIL/uL   Hemoglobin 7.9 (L) 12.0 - 15.0 g/dL   HCT 26.3 (L) 78.5 - 88.5 %   MCV 91.6 80.0 - 100.0 fL   MCH 30.0 26.0 - 34.0 pg   MCHC 32.8 30.0 - 36.0 g/dL   RDW 02.7 (H) 74.1 - 28.7 %   Platelets 239 150 - 400 K/uL   nRBC 0.0 0.0 - 0.2 %    Comment: Performed at [facility], [address] [place], [place] [address]  Comprehensive metabolic panel     Status: Abnormal   Collection Time: 06/19/20  5:40 AM  Result Value Ref Range   Sodium 138 135 - 145 mmol/L   Potassium 3.9 3.5 - 5.1 mmol/L   Chloride 105 98 - 111 mmol/L   CO2 26 22 - 32 mmol/L   Glucose, Bld 91 70 - 99 mg/dL    Comment: Glucose reference range applies only to samples taken after fasting for at least 8 hours.   BUN 23 8 - 23 mg/dL   Creatinine, Ser 2.09 0.44 - 1.00 mg/dL   Calcium 8.5 (L) 8.9 - 10.3 mg/dL   Total Protein 5.6 (L) 6.5 - 8.1 g/dL   Albumin 2.9 (L) 3.5 - 5.0 g/dL   AST 18 15 - 41 U/L   ALT 10 0 - 44 U/L   Alkaline Phosphatase 43 38 - 126 U/L   Total Bilirubin 0.5 0.3 - 1.2 mg/dL   GFR, Estimated >47 >09 mL/min    Comment: (NOTE) Calculated using the CKD-EPI Creatinine Equation (2021)    Anion gap 7 5 - 15    Comment: Performed at [facility], [address] [place], [place] [address]    Studies/Results: US RENAL  Result Date: 06/17/2020 CLINICAL DATA:  Abdominal distension. EXAM: RENAL / URINARY  TRACT ULTRASOUND COMPLETE COMPARISON:  None. FINDINGS: Right Kidney: Renal measurements: 9.8 cm x 4.4 cm x 4.0 cm = volume: 90.6 mL. Echogenicity within normal limits. No mass or hydronephrosis visualized. Left Kidney: Renal measurements: 8.5 cm x 4.7 cm x 4.8 cm = volume: 99.2 mL. Echogenicity within normal limits. No mass or hydronephrosis visualized. Bladder: The urinary bladder is distended. Other: None. IMPRESSION: 1. Distended urinary bladder. 2. Otherwise unremarkable renal ultrasound. Electronically Signed   By: Aram Candela M.D.   On: 06/17/2020 18:54    Lab Results  Component Value Date   WBC 7.1 06/19/2020   HGB 7.9 (L) 06/19/2020   HCT 24.1 (L) 06/19/2020   MCV 91.6 06/19/2020   PLT 239 06/19/2020    Assessment: Rectal bleeding in patient with previous sigmoid colectomy due to diverticulitis with colostomy left lower quadrant. Hemoccult positive stool in colostomy Atrial fibrillation  Plan: CT abdomen pelvis showed bladder distention, diverticulosis without inflammation.   No inflammation of the colon, large bowel, or small bowel.   INR currently less than 2- advised not to restart anticoagulants.  From CT possible diverticular bleed No more episodes of rectal bleeding, but did have hemoccult positive for stool in colostomy bag Continue supportive care transfuse as needed below 7 , serial CBC's Will  continue to follow closely, at this time if any further decrease in CBC or overt blood will consider proceeding with endoscopic evaluation.   Nausea, LUQ discomfort Will add on low dose PPI, pantroprazole 20 mg daily Zofran as needed  Leg swelling and pleurtic chest pain Discussed with hospitalist.    Doree AlbeeAmanda R Wildon Cuevas PA-C 06/19/2020, 11:02 AM  Contact #  [phone]

## 2020-06-19 NOTE — TOC Progression Note (Signed)
Transition of Care [facility]) - Progression Note    Patient Details  Name: Candace Cain MRN: [id number] Date of Birth: 06/04/1920  Transition of Care [facility]) CM/SW Contact  Rafael Quesada, Olegario Messier, RN Phone Number: 06/19/2020, 1:44 PM  Clinical Narrative:  Per attending not medically stable until Monday.Noted per prior note auth good till 9/13 for [facility].    Expected Discharge Plan: Skilled Nursing Facility Barriers to Discharge: Continued Medical Work up  Expected Discharge Plan and Services Expected Discharge Plan: Skilled Nursing Facility   Discharge Planning Services: CM Consult   Living arrangements for the past 2 months: Independent Living Facility                                       Social Determinants of Health (SDOH) Interventions    Readmission Risk Interventions No flowsheet data found.

## 2020-06-19 NOTE — Care Management Important Message (Signed)
Important Message  Patient Details IM Letter placed in Patient's room. Name: Candace Cain MRN: [id number] Date of Birth: 07/05/1920   Medicare Important Message Given:  Yes     Caren Macadam 06/19/2020, 10:55 AM

## 2020-06-20 ENCOUNTER — Inpatient Hospital Stay (HOSPITAL_COMMUNITY): Payer: Medicare Other

## 2020-06-20 LAB — CBC
HCT: 32.6 % — ABNORMAL LOW (ref 36.0–46.0)
Hemoglobin: 10.3 g/dL — ABNORMAL LOW (ref 12.0–15.0)
MCH: 29.9 pg (ref 26.0–34.0)
MCHC: 31.6 g/dL (ref 30.0–36.0)
MCV: 94.5 fL (ref 80.0–100.0)
Platelets: 344 10*3/uL (ref 150–400)
RBC: 3.45 MIL/uL — ABNORMAL LOW (ref 3.87–5.11)
RDW: 15 % (ref 11.5–15.5)
WBC: 12 10*3/uL — ABNORMAL HIGH (ref 4.0–10.5)
nRBC: 0 % (ref 0.0–0.2)

## 2020-06-20 LAB — COMPREHENSIVE METABOLIC PANEL
ALT: 9 U/L (ref 0–44)
AST: 18 U/L (ref 15–41)
Albumin: 3.4 g/dL — ABNORMAL LOW (ref 3.5–5.0)
Alkaline Phosphatase: 55 U/L (ref 38–126)
Anion gap: 10 (ref 5–15)
BUN: 18 mg/dL (ref 8–23)
CO2: 26 mmol/L (ref 22–32)
Calcium: 8.9 mg/dL (ref 8.9–10.3)
Chloride: 101 mmol/L (ref 98–111)
Creatinine, Ser: 0.67 mg/dL (ref 0.44–1.00)
GFR, Estimated: >60 mL/min (ref >60)
Glucose, Bld: 103 mg/dL — ABNORMAL HIGH (ref 70–99)
Potassium: 4.1 mmol/L (ref 3.5–5.1)
Sodium: 137 mmol/L (ref 135–145)
Total Bilirubin: 0.6 mg/dL (ref 0.3–1.2)
Total Protein: 7 g/dL (ref 6.5–8.1)

## 2020-06-20 MED ORDER — DICLOFENAC SODIUM 1 % EX GEL
2.0000 g | Freq: Four times a day (QID) | CUTANEOUS | Status: DC
  Administered 2020-06-20 – 2020-06-22 (×5): 2 g via TOPICAL
  Filled 2020-06-20: qty 100

## 2020-06-20 NOTE — Progress Notes (Signed)
Physical Therapy Treatment Patient Details Name: Candace Cain MRN: [id number] DOB: 1920-06-25 Today's Date: 06/20/2020    History of Present Illness 87 yo female admitted with rectal bleeding, abd pain. Hx of PAF, R hip hemiarthroplasty 2016, bradycardia, hypothyroidism, colectomy 2017    PT Comments    Pt with increased pain in L foot today with transfers.  Nursing informed.  Con't to recommend SNF rehab before returning to her independent living apartment.   Follow Up Recommendations  SNF     Equipment Recommendations  None recommended by PT    Recommendations for Other Services       Precautions / Restrictions Precautions Precautions: Fall Precaution Comments: HOH Restrictions Weight Bearing Restrictions: No    Mobility  Bed Mobility Overal bed mobility: Needs Assistance Bed Mobility: Sidelying to Sit;Rolling Rolling: Min assist Sidelying to sit: Min assist       General bed mobility comments: MIN A with cues for using bed rail to assist with roll. A to get trunk upright.    Transfers Overall transfer level: Needs assistance Equipment used: 4-wheeled walker Transfers: Sit to/from [employer] Sit to Stand: Mod assist;+2 physical assistance;From elevated surface Stand pivot transfers: Mod assist;+2 physical assistance       General transfer comment: Difficulty getting feet under her COG and feet sliding forward with L foot pain.  Attempted again after re-positioned feet and raised bed, but continue with posterior lean and feet still sliding forward and reports pain in L foot. Performed SPT without RW to recliner.  Ambulation/Gait             General Gait Details: deferred due to foot pain   Stairs             Wheelchair Mobility    Modified Rankin (Stroke Patients Only)       Balance   Sitting-balance support: Feet supported Sitting balance-Leahy Scale: Fair   Postural control: Posterior lean Standing balance support:  Bilateral upper extremity supported;During functional activity Standing balance-Leahy Scale: Poor                              Cognition Arousal/Alertness: Awake/alert Behavior During Therapy: WFL for tasks assessed/performed Overall Cognitive Status: Within Functional Limits for tasks assessed                                        Exercises      General Comments General comments (skin integrity, edema, etc.): slightly reddened area on lateral portion of L foot      Pertinent Vitals/Pain Pain Assessment: Faces Faces Pain Scale: Hurts whole lot Pain Location: L foot  with transfer Pain Descriptors / Indicators: Moaning;Grimacing Pain Intervention(s): Limited activity within patient's tolerance;Monitored during session;Repositioned    Home Living                      Prior Function            PT Goals (current goals can now be found in the care plan section) Acute Rehab PT Goals Patient Stated Goal: back to Ind Living PT Goal Formulation: With patient/family Time For Goal Achievement: 06/30/20 Potential to Achieve Goals: Good Progress towards PT goals: Not progressing toward goals - comment (increased pain)    Frequency    Min 2X/week      PT Plan Current  plan remains appropriate;Frequency needs to be updated    Co-evaluation              AM-PAC PT "6 Clicks" Mobility   Outcome Measure  Help needed turning from your back to your side while in a flat bed without using bedrails?: A Little Help needed moving from lying on your back to sitting on the side of a flat bed without using bedrails?: A Little Help needed moving to and from a bed to a chair (including a wheelchair)?: A Little Help needed standing up from a chair using your arms (e.g., wheelchair or bedside chair)?: A Little Help needed to walk in hospital room?: A Lot Help needed climbing 3-5 steps with a railing? : A Lot 6 Click Score: 16    End of Session  Equipment Utilized During Treatment: Gait belt Activity Tolerance: Patient limited by pain Patient left: in chair;with call bell/phone within reach;with chair alarm set;with family/visitor present Nurse Communication: Mobility status;Other (comment) (new c/o L foot pain) PT Visit Diagnosis: Muscle weakness (generalized) (M62.81);History of falling (Z91.81);Unsteadiness on feet (R26.81)     Time: 6384-5364 PT Time Calculation (min) (ACUTE ONLY): 18 min  Charges:  $Therapeutic Activity: 8-22 mins                     Jonny Longino L. Katrinka Blazing, PT  06/20/2020    Enzo Montgomery 06/20/2020, 4:36 PM

## 2020-06-20 NOTE — Plan of Care (Signed)
Pt slept through the night; no s/s of acute distress or pain observed or reported; pt with foley 2nd to urine retention, almost no urine produced since last night; call light within reach and bed at lowest level for safety. Pt AAOx4

## 2020-06-20 NOTE — Progress Notes (Signed)
Hemoglobin holding steady as of yesterday, 8.5 at 10 PM last night, not rechecked today.  Will order for tomorrow.  Reportedly, patient has had no further blood per rectum, and ostomy output is brown in color, without evident blood.  No new suggestions from our standpoint.  Florencia Reasons, M.D. Pager [phone] If no answer or after 5 PM call [phone]

## 2020-06-20 NOTE — Progress Notes (Signed)
PROGRESS NOTE    Candace Sisler  LPN:[id number] DOB: 12/19/1920 DOA: 06/12/2020 PCP: Daisy Floro, MD   Brief Narrative: 87 y.o. female with medical history significant for atrial fibrillation on Coumadin,, hypothyroidism, perforated diverticulitis status post sigmoid colectomy and colostomy in 2017, now presenting to the emergency department red blood per rectum.  Patient reports that she was in her usual state and having an uneventful day until approximately 6 PM when she reports passing a large volume of bright red blood on the toilet.  She has not seen any blood in her colostomy bag, has not had any abdominal or rectal pain, denies trauma, and has never experienced this before.  Family at bedside notes that they saw some red blood clots.   She was admitted for rectal bleeding.  This seems to have improved after vitamin K and normalization of INR.   Assessment & Plan:   Principal Problem:   Rectal bleeding Active Problems:   Essential hypertension   Atrial fibrillation (HCC)   Hypothyroidism   Diverticulitis of colon with perforation s/p colectomy/colostomy 12/25/2015    1. Rectal bleeding  Anemia  Abdominal Pain- - Patient on warfarin for a fib and status post sigmoid colectomy and colostomy in 2017 presents with bright red blood per rectum  - Initial Hgb 11.6 hemoglobin down to 6.8.  Coumadin still on hold. We will transfuse 1 unit of packed RBCs today.  Continue to hold Coumadin.  FOBT positive.  Hb stable st 10.3  still with black stools,need to monitor closely   GI following.   Discussed with patient's daughter and niece. CT abdomen shows bladder distention, bladder scan showed more than 900 cc, Foley catheter was placed.  She is requesting for the cath to be removed for void trial.  Therefore we will DC her Foley catheter and see if she is able to make urine.  If not we will have to place a catheter back this was explained to the patient.renal ultrasound- no hydronephrosis  has distended bladder   2. Atrial fibrillation continue to hold Coumadin   3. Hypertension blood pressure soft 119/58 continue to hold antihypertensives.    4 Anemia Follow iron, b12, folate, ferritin - labs c/w AOCD  5 urinary retention bladder scan yesterday showed urine more than 900 cc.  I and  o was done with 800 cc of urine.  We will bladder scan today and consider repeating I and O if she is retaining urine we will place Foley catheter.  Discussed with her niece.  Estimated body mass index is 22.31 kg/m as calculated from the following:   Height as of this encounter: 5' 1.5" (1.562 m).   Weight as of this encounter: 54.4 kg.  DVT prophylaxis: SCD Code Status: DNR Family Communication: Discussed with the niece Disposition: SNF/ALF   Status is: inpatient   The patient will require care spanning > 2 midnights and should be moved to inpatient because: Inpatient level of care appropriate due to severity of illness   Dispo: The patient is from: Home              Anticipated d/c is to: Home              Patient currently is not medically stable to d/c.              Difficult to place patient No    Consultants:  GI   Procedures:  none  Subjective: She is resting in bed due to lower extremity  edema a venous Doppler was done yesterday to rule out DVT she is negative for DVT. Objective: Vitals:   06/19/20 1245 06/19/20 2049 06/20/20 0434 06/20/20 1401  BP: (!) 142/70 (!) 141/73 (!) 156/66 133/70  Pulse: (!) 46 (!) 59 64 73  Resp: 16 20 18 16   Temp: 98.5 F (36.9 C) 98.8 F (37.1 C) 98.2 F (36.8 C) 100.1 F (37.8 C)  TempSrc: Oral Oral Oral Oral  SpO2:  97% 97% 100%  Weight:      Height:        Intake/Output Summary (Last 24 hours) at 06/20/2020 1502 Last data filed at 06/20/2020 1002 Gross per 24 hour  Intake 120 ml  Output 600 ml  Net -480 ml    Filed Weights   06/12/20 1958  Weight: 54.4 kg    Examination:  General exam: Appears calm and comfortable   Respiratory system: Clear to auscultation. Respiratory effort normal. Cardiovascular system: S1 & S2 heard, RRR. No JVD, murmurs, rubs, gallops or clicks. No pedal edema. Gastrointestinal system: Abdomen is nondistended, soft and nontender. No organomegaly or masses felt. Normal bowel sounds heard. Central nervous system: Alert and oriented. No focal neurological deficits. Extremities: Symmetric 5 x 5 power. Skin: No rashes, lesions or ulcers Psychiatry: Judgement and insight appear normal. Mood & affect appropriate.     Data Reviewed: I have personally reviewed following labs and imaging studies  CBC: Recent Labs  Lab 06/14/20 0023 06/14/20 1245 06/15/20 0648 06/15/20 1301 06/16/20 0525 06/16/20 1256 06/18/20 2039 06/19/20 0540 06/19/20 1309 06/19/20 2031 06/20/20 1326  WBC 5.9  --  5.1  --  6.1  --   --  7.1  --   --  12.0*  NEUTROABS 3.8  --  3.3  --  3.9  --   --   --   --   --   --   HGB 8.0*   < > 7.4*   < > 7.5*   < > 8.4* 7.9* 8.8* 8.5* 10.3*  HCT 25.7*   < > 24.1*   < > 24.9*   < > 26.3* 24.1* 27.6* 27.3* 32.6*  MCV 94.8  --  96.4  --  98.4  --   --  91.6  --   --  94.5  PLT 203  --  207  --  210  --   --  239  --   --  344   < > = values in this interval not displayed.    Basic Metabolic Panel: Recent Labs  Lab 06/14/20 0023 06/15/20 0648 06/16/20 0525 06/19/20 0540 06/20/20 1326  NA 137 140 137 138 137  K 3.6 3.5 3.6 3.9 4.1  CL 108 110 108 105 101  CO2 24 25 22 26 26   GLUCOSE 90 90 96 91 103*  BUN 23 32* 23 23 18   CREATININE 0.57 0.66 0.71 0.47 0.67  CALCIUM 8.3* 8.6* 8.6* 8.5* 8.9  MG 1.8 1.8 1.9  --   --   PHOS 2.6 2.7 2.9  --   --     GFR: Estimated Creatinine Clearance: 31.1 mL/min (by C-G formula based on SCr of 0.67 mg/dL). Liver Function Tests: Recent Labs  Lab 06/14/20 0023 06/15/20 0648 06/16/20 0525 06/19/20 0540 06/20/20 1326  AST 15 14* 15 18 18   ALT 10 10 10 10 9   ALKPHOS 44 44 43 43 55  BILITOT 0.7 0.5 0.6 0.5 0.6  PROT  5.5* 5.5* 5.2* 5.6* 7.0  ALBUMIN 3.2*  3.3* 3.0* 2.9* 3.4*    No results for input(s): LIPASE, AMYLASE in the last 168 hours. No results for input(s): AMMONIA in the last 168 hours. Coagulation Profile: Recent Labs  Lab 06/14/20 0023 06/15/20 0648  INR 2.5* 1.2    Cardiac Enzymes: No results for input(s): CKTOTAL, CKMB, CKMBINDEX, TROPONINI in the last 168 hours. BNP (last 3 results) No results for input(s): PROBNP in the last 8760 hours. HbA1C: No results for input(s): HGBA1C in the last 72 hours. CBG: No results for input(s): GLUCAP in the last 168 hours. Lipid Profile: No results for input(s): CHOL, HDL, LDLCALC, TRIG, CHOLHDL, LDLDIRECT in the last 72 hours. Thyroid Function Tests: No results for input(s): TSH, T4TOTAL, FREET4, T3FREE, THYROIDAB in the last 72 hours. Anemia Panel: No results for input(s): VITAMINB12, FOLATE, FERRITIN, TIBC, IRON, RETICCTPCT in the last 72 hours.  Sepsis Labs: No results for input(s): PROCALCITON, LATICACIDVEN in the last 168 hours.  Recent Results (from the past 240 hour(s))  Resp Panel by RT-PCR (Flu A&B, Covid) Nasopharyngeal Swab     Status: None   Collection Time: 06/12/20 10:44 PM   Specimen: Nasopharyngeal Swab; Nasopharyngeal(NP) swabs in vial transport medium  Result Value Ref Range Status   SARS Coronavirus 2 by RT PCR NEGATIVE NEGATIVE Final    Comment: (NOTE) SARS-CoV-2 target nucleic acids are NOT DETECTED.  The SARS-CoV-2 RNA is generally detectable in upper respiratory specimens during the acute phase of infection. The lowest concentration of SARS-CoV-2 viral copies this assay can detect is 138 copies/mL. A negative result does not preclude SARS-Cov-2 infection and should not be used as the sole basis for treatment or other patient management decisions. A negative result may occur with  improper specimen collection/handling, submission of specimen other than nasopharyngeal swab, presence of viral mutation(s) within  the areas targeted by this assay, and inadequate number of viral copies(<138 copies/mL). A negative result must be combined with clinical observations, patient history, and epidemiological information. The expected result is Negative.  Fact Sheet for Patients:  BloggerCourse.com  Fact Sheet for Healthcare Providers:  SeriousBroker.it  This test is no t yet approved or cleared by the [place] FDA and  has been authorized for detection and/or diagnosis of SARS-CoV-2 by FDA under an Emergency Use Authorization (EUA). This EUA will remain  in effect (meaning this test can be used) for the duration of the COVID-19 declaration under Section 564(b)(1) of the Act, 21 U.S.C.section 360bbb-3(b)(1), unless the authorization is terminated  or revoked sooner.       Influenza A by PCR NEGATIVE NEGATIVE Final   Influenza B by PCR NEGATIVE NEGATIVE Final    Comment: (NOTE) The Xpert Xpress SARS-CoV-2/FLU/RSV plus assay is intended as an aid in the diagnosis of influenza from Nasopharyngeal swab specimens and should not be used as a sole basis for treatment. Nasal washings and aspirates are unacceptable for Xpert Xpress SARS-CoV-2/FLU/RSV testing.  Fact Sheet for Patients: BloggerCourse.com  Fact Sheet for Healthcare Providers: SeriousBroker.it  This test is not yet approved or cleared by the [place] FDA and has been authorized for detection and/or diagnosis of SARS-CoV-2 by FDA under an Emergency Use Authorization (EUA). This EUA will remain in effect (meaning this test can be used) for the duration of the COVID-19 declaration under Section 564(b)(1) of the Act, 21 U.S.C. section 360bbb-3(b)(1), unless the authorization is terminated or revoked.  Performed at [facility], [address], [place], [place] [address]  Radiology Studies: DG Abd 1  View  Result Date: 06/19/2020 CLINICAL DATA:  Abdominal pain. EXAM: ABDOMEN - 1 VIEW COMPARISON:  December 25, 2015. FINDINGS: The bowel gas pattern is normal. Residual contrast is noted in the transverse and descending colon. Nasogastric tube tip is seen in expected position of proximal stomach. No radio-opaque calculi or other significant radiographic abnormality are seen. IMPRESSION: No evidence of bowel obstruction or ileus. Electronically Signed   By: Lupita RaiderJames  Green Jr M.D.   On: 06/19/2020 13:48   VAS US LOWER EXTREMITY VENOUS (DVT)  Result Date: 06/19/2020  Lower Venous DVT Study Patient Name:  Cheri FowlerLVA Klausing  Date of Exam:   06/19/2020 Medical Rec #: [id number]     Accession #:    [id number] Date of Birth: [birth year]     Patient Gender: F Patient Age:   89Y [facility] Procedure:      VAS US LOWER EXTREMITY VENOUS (DVT) Referring Phys: [id number] Gardiner RamusELIZABETH G Jene Huq --------------------------------------------------------------------------------  Indications: "edema" per MD orders - no edema present on exam.  Anticoagulation: Coumadin prior to admission for Afib (GI bleed). Limitations: Patient unable to follow instructions - poor positioning. Comparison Study: No previous exams Performing Technologist: Jody Hill RVT, RDMS  Examination Guidelines: A complete evaluation includes B-mode imaging, spectral Doppler, color Doppler, and power Doppler as needed of all accessible portions of each vessel. Bilateral testing is considered an integral part of a complete examination. Limited examinations for reoccurring indications may be performed as noted. The reflux portion of the exam is performed with the patient in reverse Trendelenburg.  +---------+---------------+---------+-----------+----------+-------------------+ RIGHT    CompressibilityPhasicitySpontaneityPropertiesThrombus Aging      +---------+---------------+---------+-----------+----------+-------------------+ CFV      Full            Yes      Yes                                      +---------+---------------+---------+-----------+----------+-------------------+ SFJ      Full                                                             +---------+---------------+---------+-----------+----------+-------------------+ FV Prox  Full           Yes      Yes                                      +---------+---------------+---------+-----------+----------+-------------------+ FV Mid   Full           Yes      Yes                                      +---------+---------------+---------+-----------+----------+-------------------+ FV DistalFull           Yes      Yes                                      +---------+---------------+---------+-----------+----------+-------------------+ PFV  Full                                                             +---------+---------------+---------+-----------+----------+-------------------+ POP      Full           Yes      Yes                                      +---------+---------------+---------+-----------+----------+-------------------+ PTV      Full                                                             +---------+---------------+---------+-----------+----------+-------------------+ PERO     Full                                         Not well visualized +---------+---------------+---------+-----------+----------+-------------------+   +---------+---------------+---------+-----------+----------+-------------------+ LEFT     CompressibilityPhasicitySpontaneityPropertiesThrombus Aging      +---------+---------------+---------+-----------+----------+-------------------+ CFV      Full           Yes      Yes                                      +---------+---------------+---------+-----------+----------+-------------------+ SFJ      Full                                                              +---------+---------------+---------+-----------+----------+-------------------+ FV Prox  Full           Yes      Yes                                      +---------+---------------+---------+-----------+----------+-------------------+ FV Mid   Full           Yes      Yes                                      +---------+---------------+---------+-----------+----------+-------------------+ FV DistalFull           Yes      Yes                                      +---------+---------------+---------+-----------+----------+-------------------+ PFV      Full                                                             +---------+---------------+---------+-----------+----------+-------------------+  POP      Full           Yes      Yes                                      +---------+---------------+---------+-----------+----------+-------------------+ PTV      Full                                         Not well visualized +---------+---------------+---------+-----------+----------+-------------------+ PERO     Full                                         Not well visualized +---------+---------------+---------+-----------+----------+-------------------+     Summary: BILATERAL: - No evidence of deep vein thrombosis seen in the lower extremities, bilaterally. - No evidence of superficial venous thrombosis in the lower extremities, bilaterally. -No evidence of popliteal cyst, bilaterally.   *See table(s) above for measurements and observations. Electronically signed by Heath Lark on 06/19/2020 at 9:18:50 PM.    Final          Scheduled Meds:  Chlorhexidine Gluconate Cloth  6 each Topical Daily   pantoprazole  20 mg Oral Q0600   Continuous Infusions:   LOS: 7 days     Alwyn Ren, MD  06/20/2020, 3:02 PM

## 2020-06-21 LAB — URINALYSIS, ROUTINE W REFLEX MICROSCOPIC
Bilirubin Urine: NEGATIVE
Glucose, UA: NEGATIVE mg/dL
Ketones, ur: NEGATIVE mg/dL
Nitrite: NEGATIVE
Protein, ur: NEGATIVE mg/dL
Specific Gravity, Urine: 1.017 (ref 1.005–1.030)
WBC, UA: >50 WBC/hpf — ABNORMAL HIGH (ref 0–5)
pH: 5 (ref 5.0–8.0)

## 2020-06-21 LAB — CBC
HCT: 27.6 % — ABNORMAL LOW (ref 36.0–46.0)
Hemoglobin: 9.4 g/dL — ABNORMAL LOW (ref 12.0–15.0)
MCH: 30.3 pg (ref 26.0–34.0)
MCHC: 34.1 g/dL (ref 30.0–36.0)
MCV: 89 fL (ref 80.0–100.0)
Platelets: 95 10*3/uL — ABNORMAL LOW (ref 150–400)
RBC: 3.1 MIL/uL — ABNORMAL LOW (ref 3.87–5.11)
RDW: 14.6 % (ref 11.5–15.5)
WBC: 9 10*3/uL (ref 4.0–10.5)
nRBC: 0 % (ref 0.0–0.2)

## 2020-06-21 NOTE — Plan of Care (Signed)
Pt had to be straight cath 2nd to urinary retention; UA sent to lab; pt tolerated procedure very well; no s/s of acute distress or pain reported or observed. Call light within reach and bed at lowest position for safety.

## 2020-06-21 NOTE — Progress Notes (Signed)
Patient's hemoglobin of 9.5 this morning is lower than yesterday, but still higher than expected following transfusion of 1 unit of packed cells 2 days ago for hemoglobin of 7.9.  It appears there has been no further bleeding, suggesting that the patient's bleeding was in association with anticoagulation.  I have discussed the patient's case with her attending hospitalist, who has had extensive discussion with the patient's family, as well as with Dr. Ewing Schlein.  In view of her age of 46, and the fact that the patient is not going to be restarted on anticoagulation, and the absence of any mass seen on her CT scan, and the likely explanation for the bleeding from her Hartman's pouch being of benign origin (either diverticular disease or exclusion proctitis), it is felt that putting the patient through sigmoidoscopic evaluation of the Hartman's pouch is neither necessary nor appropriate.    However, as noted earlier in this hospitalization by Dr. Dulce Sellar, if there were a problem with persistent bleeding following cessation of anticoagulation (for example, going forward as an outpatient), sigmoidoscopy could be reconsidered.  Florencia Reasons, M.D. Pager [phone] If no answer or after 5 PM call [phone]

## 2020-06-21 NOTE — Progress Notes (Signed)
PROGRESS NOTE    Bernetha Anschutz  ZOX:[id number] DOB: 07/02/1920 DOA: 06/12/2020 PCP: Daisy Floro, MD   Brief Narrative: 87 y.o. female with medical history significant for atrial fibrillation on Coumadin,, hypothyroidism, perforated diverticulitis status post sigmoid colectomy and colostomy in 2017, now presenting to the emergency department red blood per rectum.  Patient reports that she was in her usual state and having an uneventful day until approximately 6 PM when she reports passing a large volume of bright red blood on the toilet.  She has not seen any blood in her colostomy bag, has not had any abdominal or rectal pain, denies trauma, and has never experienced this before.  Family at bedside notes that they saw some red blood clots.   She was admitted for rectal bleeding.  This seems to have improved after vitamin K and normalization of INR.   Assessment & Plan:   Principal Problem:   Rectal bleeding Active Problems:   Essential hypertension   Atrial fibrillation (HCC)   Hypothyroidism   Diverticulitis of colon with perforation s/p colectomy/colostomy 12/25/2015    1. Rectal bleeding  Anemia  Abdominal Pain- - Patient on warfarin for a fib and status post sigmoid colectomy and colostomy in 2017 presents with bright red blood per rectum. Upon admission her initial hemoglobin was 11.6 it dropped to 6.8 and she received 1 unit of blood transfusion during the hospital stay.  Coumadin has been stopped during this hospital stay. Was seen in consultation by [facility] no plans for further investigation. Discussed with patient's daughter and niece. CT abdomen shows bladder distention, Foley catheter was placed which was taken out on 06/20/2020 per patient request however overnight she had problems with voiding and in and out catheterization was done again.  I suspect she will need Foley catheter on discharge due to acute urinary retention.renal ultrasound- no hydronephrosis has  distended bladder   2. Atrial fibrillation continue to hold Coumadin   3. Hypertension blood pressure soft 119/58 continue to hold antihypertensives.    4 Anemia Follow iron, b12, folate, ferritin - labs c/w AOCD  5 urinary retention Foley catheter was removed 06/20/2020 per patient request monitor for retention and need for Foley. Estimated body mass index is 22.31 kg/m as calculated from the following:   Height as of this encounter: 5' 1.5" (1.562 m).   Weight as of this encounter: 54.4 kg.  DVT prophylaxis: SCD Code Status: DNR Family Communication: Discussed with the niece Disposition: SNF/ALF   Status is: inpatient   The patient will require care spanning > 2 midnights and should be moved to inpatient because: Inpatient level of care appropriate due to severity of illness   Dispo: The patient is from: Home              Anticipated d/c is to: Home              Patient currently is not medically stable to d/c.              Difficult to place patient No    Consultants:  GI   Procedures:  none  Subjective: Patient is resting in bed  She was complaining of left foot pain with tenderness to touch with no evidence of erythema or edema.  X-ray showed no acute fracture she appears to have musculoskeletal pain to her left foot  objective: Vitals:   06/20/20 0434 06/20/20 1401 06/20/20 2031 06/21/20 0445  BP: (!) 156/66 133/70 131/69 (!) 145/64  Pulse: 64  73 70 72  Resp: Temp: 98.2 F (36.8 C) 100.1 F (37.8 C) 98.8 F (37.1 C) 98.7 F (37.1 C)  TempSrc: Oral Oral Oral Oral  SpO2: 97% 100% 95% 94%  Weight:      Height:        Intake/Output Summary (Last 24 hours) at 06/21/2020 1055 Last data filed at 06/21/2020 0100 Gross per 24 hour  Intake 240 ml  Output 520 ml  Net -280 ml    Filed Weights   06/12/20 1958  Weight: 54.4 kg    Examination:  General exam: Appears calm and comfortable  Respiratory system: Clear to auscultation. Respiratory  effort normal. Cardiovascular system: S1 & S2 heard, RRR. No JVD, murmurs, rubs, gallops or clicks. No pedal edema. Gastrointestinal system: Abdomen is nondistended, soft and nontender. No organomegaly or masses felt. Normal bowel sounds heard. Central nervous system: Alert and oriented. No focal neurological deficits. Extremities: Symmetric 5 x 5 power. Skin: No rashes, lesions or ulcers Psychiatry: Judgement and insight appear normal. Mood & affect appropriate.     Data Reviewed: I have personally reviewed following labs and imaging studies  CBC: Recent Labs  Lab 06/15/20 0648 06/15/20 1301 06/16/20 0525 06/16/20 1256 06/19/20 0540 06/19/20 1309 06/19/20 2031 06/20/20 1326 06/21/20 0601  WBC 5.1  --  6.1  --  7.1  --   --  12.0* 9.0  NEUTROABS 3.3  --  3.9  --   --   --   --   --   --   HGB 7.4*   < > 7.5*   < > 7.9* 8.8* 8.5* 10.3* 9.4*  HCT 24.1*   < > 24.9*   < > 24.1* 27.6* 27.3* 32.6* 27.6*  MCV 96.4  --  98.4  --  91.6  --   --  94.5 89.0  PLT 207  --  210  --  239  --   --  344 95*   < > = values in this interval not displayed.    Basic Metabolic Panel: Recent Labs  Lab 06/15/20 0648 06/16/20 0525 06/19/20 0540 06/20/20 1326  NA 140 137 138 137  K 3.5 3.6 3.9 4.1  CL 110 108 105 101  CO2 GLUCOSE 90 96 91 103*  BUN 32* CREATININE 0.66 0.71 0.47 0.67  CALCIUM 8.6* 8.6* 8.5* 8.9  MG 1.8 1.9  --   --   PHOS 2.7 2.9  --   --     GFR: Estimated Creatinine Clearance: 31.1 mL/min (by C-G formula based on SCr of 0.67 mg/dL). Liver Function Tests: Recent Labs  Lab 06/15/20 0648 06/16/20 0525 06/19/20 0540 06/20/20 1326  AST 14* ALT ALKPHOS 44 43 43 55  BILITOT 0.5 0.6 0.5 0.6  PROT 5.5* 5.2* 5.6* 7.0  ALBUMIN 3.3* 3.0* 2.9* 3.4*    No results for input(s): LIPASE, AMYLASE in the last 168 hours. No results for input(s): AMMONIA in the last 168 hours. Coagulation Profile: Recent Labs  Lab 06/15/20 0648   INR 1.2    Cardiac Enzymes: No results for input(s): CKTOTAL, CKMB, CKMBINDEX, TROPONINI in the last 168 hours. BNP (last 3 results) No results for input(s): PROBNP in the last 8760 hours. HbA1C: No results for input(s): HGBA1C in the last 72 hours. CBG: No results for input(s): GLUCAP in the last 168 hours. Lipid Profile: No results for  input(s): CHOL, HDL, LDLCALC, TRIG, CHOLHDL, LDLDIRECT in the last 72 hours. Thyroid Function Tests: No results for input(s): TSH, T4TOTAL, FREET4, T3FREE, THYROIDAB in the last 72 hours. Anemia Panel: No results for input(s): VITAMINB12, FOLATE, FERRITIN, TIBC, IRON, RETICCTPCT in the last 72 hours.  Sepsis Labs: No results for input(s): PROCALCITON, LATICACIDVEN in the last 168 hours.  Recent Results (from the past 240 hour(s))  Resp Panel by RT-PCR (Flu A&B, Covid) Nasopharyngeal Swab     Status: None   Collection Time: 06/12/20 10:44 PM   Specimen: Nasopharyngeal Swab; Nasopharyngeal(NP) swabs in vial transport medium  Result Value Ref Range Status   SARS Coronavirus 2 by RT PCR NEGATIVE NEGATIVE Final    Comment: (NOTE) SARS-CoV-2 target nucleic acids are NOT DETECTED.  The SARS-CoV-2 RNA is generally detectable in upper respiratory specimens during the acute phase of infection. The lowest concentration of SARS-CoV-2 viral copies this assay can detect is 138 copies/mL. A negative result does not preclude SARS-Cov-2 infection and should not be used as the sole basis for treatment or other patient management decisions. A negative result may occur with  improper specimen collection/handling, submission of specimen other than nasopharyngeal swab, presence of viral mutation(s) within the areas targeted by this assay, and inadequate number of viral copies(<138 copies/mL). A negative result must be combined with clinical observations, patient history, and epidemiological information. The expected result is Negative.  Fact Sheet for  Patients:  BloggerCourse.com  Fact Sheet for Healthcare Providers:  SeriousBroker.it  This test is no t yet approved or cleared by the [place] FDA and  has been authorized for detection and/or diagnosis of SARS-CoV-2 by FDA under an Emergency Use Authorization (EUA). This EUA will remain  in effect (meaning this test can be used) for the duration of the COVID-19 declaration under Section 564(b)(1) of the Act, 21 U.S.C.section 360bbb-3(b)(1), unless the authorization is terminated  or revoked sooner.       Influenza A by PCR NEGATIVE NEGATIVE Final   Influenza B by PCR NEGATIVE NEGATIVE Final    Comment: (NOTE) The Xpert Xpress SARS-CoV-2/FLU/RSV plus assay is intended as an aid in the diagnosis of influenza from Nasopharyngeal swab specimens and should not be used as a sole basis for treatment. Nasal washings and aspirates are unacceptable for Xpert Xpress SARS-CoV-2/FLU/RSV testing.  Fact Sheet for Patients: BloggerCourse.com  Fact Sheet for Healthcare Providers: SeriousBroker.it  This test is not yet approved or cleared by the [place] FDA and has been authorized for detection and/or diagnosis of SARS-CoV-2 by FDA under an Emergency Use Authorization (EUA). This EUA will remain in effect (meaning this test can be used) for the duration of the COVID-19 declaration under Section 564(b)(1) of the Act, 21 U.S.C. section 360bbb-3(b)(1), unless the authorization is terminated or revoked.  Performed at [facility], [address] [place], [place] [address]          Radiology Studies: DG Abd 1 View  Result Date: 06/19/2020 CLINICAL DATA:  Abdominal pain. EXAM: ABDOMEN - 1 VIEW COMPARISON:  December 25, 2015. FINDINGS: The bowel gas pattern is normal. Residual contrast is noted in the transverse and descending colon. Nasogastric tube tip is seen  in expected position of proximal stomach. No radio-opaque calculi or other significant radiographic abnormality are seen. IMPRESSION: No evidence of bowel obstruction or ileus. Electronically Signed   By: Lupita Raider M.D.   On: 06/19/2020 13:48   DG Foot 2 Views Left  Result Date: 06/20/2020 CLINICAL  DATA:  Pain left foot.  Especially when bearing weight. EXAM: LEFT FOOT - 2 VIEW COMPARISON:  None. FINDINGS: Diffusely decreased bone density. No cortical erosion or destruction. No evidence of fracture, dislocation, or joint effusion. Degenerative changes of the midfoot. Degenerative changes of the interphalangeal joints. Degenerative changes of the first digit metatarsophalangeal joint. No evidence of severe arthropathy. No aggressive appearing focal bone abnormality. Soft tissues are unremarkable. IMPRESSION: No acute displaced fracture or dislocation. Electronically Signed   By: Tish Frederickson M.D.   On: 06/20/2020 17:09   VAS [place] LOWER EXTREMITY VENOUS (DVT)  Result Date: 06/19/2020  Lower Venous DVT Study Patient Name:  Candace Cain  Date of Exam:   06/19/2020 Medical Rec #: [id number]     Accession #:    [id number] Date of Birth: [birth year]     Patient Gender: F Patient Age:   89Y Exam Location:  [facility] Procedure:      VAS [place] LOWER EXTREMITY VENOUS (DVT) Referring Phys: [id number] Gardiner Ramus Jarmal Lewelling --------------------------------------------------------------------------------  Indications: "edema" per MD orders - no edema present on exam.  Anticoagulation: Coumadin prior to admission for Afib (GI bleed). Limitations: Patient unable to follow instructions - poor positioning. Comparison Study: No previous exams Performing Technologist: Jody Hill RVT, RDMS  Examination Guidelines: A complete evaluation includes B-mode imaging, spectral Doppler, color Doppler, and power Doppler as needed of all accessible portions of each vessel. Bilateral testing is considered an integral part of a complete  examination. Limited examinations for reoccurring indications may be performed as noted. The reflux portion of the exam is performed with the patient in reverse Trendelenburg.  +---------+---------------+---------+-----------+----------+-------------------+ RIGHT    CompressibilityPhasicitySpontaneityPropertiesThrombus Aging      +---------+---------------+---------+-----------+----------+-------------------+ CFV      Full           Yes      Yes                                      +---------+---------------+---------+-----------+----------+-------------------+ SFJ      Full                                                             +---------+---------------+---------+-----------+----------+-------------------+ FV Prox  Full           Yes      Yes                                      +---------+---------------+---------+-----------+----------+-------------------+ FV Mid   Full           Yes      Yes                                      +---------+---------------+---------+-----------+----------+-------------------+ FV DistalFull           Yes      Yes                                      +---------+---------------+---------+-----------+----------+-------------------+ PFV  Full                                                             +---------+---------------+---------+-----------+----------+-------------------+ POP      Full           Yes      Yes                                      +---------+---------------+---------+-----------+----------+-------------------+ PTV      Full                                                             +---------+---------------+---------+-----------+----------+-------------------+ PERO     Full                                         Not well visualized +---------+---------------+---------+-----------+----------+-------------------+    +---------+---------------+---------+-----------+----------+-------------------+ LEFT     CompressibilityPhasicitySpontaneityPropertiesThrombus Aging      +---------+---------------+---------+-----------+----------+-------------------+ CFV      Full           Yes      Yes                                      +---------+---------------+---------+-----------+----------+-------------------+ SFJ      Full                                                             +---------+---------------+---------+-----------+----------+-------------------+ FV Prox  Full           Yes      Yes                                      +---------+---------------+---------+-----------+----------+-------------------+ FV Mid   Full           Yes      Yes                                      +---------+---------------+---------+-----------+----------+-------------------+ FV DistalFull           Yes      Yes                                      +---------+---------------+---------+-----------+----------+-------------------+ PFV      Full                                                             +---------+---------------+---------+-----------+----------+-------------------+  POP      Full           Yes      Yes                                      +---------+---------------+---------+-----------+----------+-------------------+ PTV      Full                                         Not well visualized +---------+---------------+---------+-----------+----------+-------------------+ PERO     Full                                         Not well visualized +---------+---------------+---------+-----------+----------+-------------------+     Summary: BILATERAL: - No evidence of deep vein thrombosis seen in the lower extremities, bilaterally. - No evidence of superficial venous thrombosis in the lower extremities, bilaterally. -No evidence of popliteal cyst, bilaterally.   *See  table(s) above for measurements and observations. Electronically signed by Heath Lark on 06/19/2020 at 9:18:50 PM.    Final          Scheduled Meds:  Chlorhexidine Gluconate Cloth  6 each Topical Daily   diclofenac Sodium  2 g Topical QID   pantoprazole  20 mg Oral Q0600   Continuous Infusions:   LOS: 8 days     Alwyn Ren, MD  06/21/2020, 10:55 AM

## 2020-06-22 DIAGNOSIS — E43 Unspecified severe protein-calorie malnutrition: Secondary | ICD-10-CM | POA: Diagnosis not present

## 2020-06-22 DIAGNOSIS — T83511A Infection and inflammatory reaction due to indwelling urethral catheter, initial encounter: Secondary | ICD-10-CM | POA: Diagnosis not present

## 2020-06-22 DIAGNOSIS — Z933 Colostomy status: Secondary | ICD-10-CM | POA: Diagnosis not present

## 2020-06-22 DIAGNOSIS — R600 Localized edema: Secondary | ICD-10-CM | POA: Diagnosis not present

## 2020-06-22 DIAGNOSIS — K625 Hemorrhage of anus and rectum: Secondary | ICD-10-CM | POA: Diagnosis not present

## 2020-06-22 DIAGNOSIS — I480 Paroxysmal atrial fibrillation: Secondary | ICD-10-CM | POA: Diagnosis not present

## 2020-06-22 DIAGNOSIS — I4891 Unspecified atrial fibrillation: Secondary | ICD-10-CM | POA: Diagnosis not present

## 2020-06-22 DIAGNOSIS — K5909 Other constipation: Secondary | ICD-10-CM | POA: Diagnosis not present

## 2020-06-22 DIAGNOSIS — I10 Essential (primary) hypertension: Secondary | ICD-10-CM | POA: Diagnosis not present

## 2020-06-22 DIAGNOSIS — R1030 Lower abdominal pain, unspecified: Secondary | ICD-10-CM | POA: Diagnosis not present

## 2020-06-22 DIAGNOSIS — N3000 Acute cystitis without hematuria: Secondary | ICD-10-CM | POA: Diagnosis not present

## 2020-06-22 DIAGNOSIS — R8290 Unspecified abnormal findings in urine: Secondary | ICD-10-CM | POA: Diagnosis not present

## 2020-06-22 DIAGNOSIS — M129 Arthropathy, unspecified: Secondary | ICD-10-CM | POA: Diagnosis not present

## 2020-06-22 DIAGNOSIS — Z978 Presence of other specified devices: Secondary | ICD-10-CM | POA: Diagnosis not present

## 2020-06-22 DIAGNOSIS — N139 Obstructive and reflux uropathy, unspecified: Secondary | ICD-10-CM | POA: Diagnosis not present

## 2020-06-22 DIAGNOSIS — K5720 Diverticulitis of large intestine with perforation and abscess without bleeding: Secondary | ICD-10-CM | POA: Diagnosis not present

## 2020-06-22 DIAGNOSIS — D649 Anemia, unspecified: Secondary | ICD-10-CM | POA: Diagnosis not present

## 2020-06-22 DIAGNOSIS — B372 Candidiasis of skin and nail: Secondary | ICD-10-CM | POA: Diagnosis not present

## 2020-06-22 DIAGNOSIS — R531 Weakness: Secondary | ICD-10-CM | POA: Diagnosis not present

## 2020-06-22 DIAGNOSIS — Z79811 Long term (current) use of aromatase inhibitors: Secondary | ICD-10-CM | POA: Diagnosis not present

## 2020-06-22 DIAGNOSIS — R609 Edema, unspecified: Secondary | ICD-10-CM | POA: Diagnosis not present

## 2020-06-22 DIAGNOSIS — E039 Hypothyroidism, unspecified: Secondary | ICD-10-CM | POA: Diagnosis not present

## 2020-06-22 DIAGNOSIS — F411 Generalized anxiety disorder: Secondary | ICD-10-CM | POA: Diagnosis not present

## 2020-06-22 DIAGNOSIS — Z8719 Personal history of other diseases of the digestive system: Secondary | ICD-10-CM | POA: Diagnosis not present

## 2020-06-22 DIAGNOSIS — D62 Acute posthemorrhagic anemia: Secondary | ICD-10-CM | POA: Diagnosis not present

## 2020-06-22 DIAGNOSIS — M25561 Pain in right knee: Secondary | ICD-10-CM | POA: Diagnosis not present

## 2020-06-22 DIAGNOSIS — R339 Retention of urine, unspecified: Secondary | ICD-10-CM | POA: Diagnosis not present

## 2020-06-22 LAB — CBC
HCT: 28.7 % — ABNORMAL LOW (ref 36.0–46.0)
Hemoglobin: 9.3 g/dL — ABNORMAL LOW (ref 12.0–15.0)
MCH: 30.2 pg (ref 26.0–34.0)
MCHC: 32.4 g/dL (ref 30.0–36.0)
MCV: 93.2 fL (ref 80.0–100.0)
Platelets: 317 10*3/uL (ref 150–400)
RBC: 3.08 MIL/uL — ABNORMAL LOW (ref 3.87–5.11)
RDW: 14.4 % (ref 11.5–15.5)
WBC: 9.6 10*3/uL (ref 4.0–10.5)
nRBC: 0 % (ref 0.0–0.2)

## 2020-06-22 LAB — SARS CORONAVIRUS 2 (TAT 6-24 HRS): SARS Coronavirus 2: NEGATIVE

## 2020-06-22 LAB — URIC ACID: Uric Acid, Serum: 3.6 mg/dL (ref 2.5–7.1)

## 2020-06-22 LAB — RESP PANEL BY RT-PCR (FLU A&B, COVID) ARPGX2
Influenza A by PCR: NEGATIVE
Influenza B by PCR: NEGATIVE
SARS Coronavirus 2 by RT PCR: NEGATIVE

## 2020-06-22 MED ORDER — DICLOFENAC SODIUM 1 % EX GEL: 2.0000 g | Freq: Four times a day (QID) | CUTANEOUS | 1 refills | Status: AC

## 2020-06-22 NOTE — TOC Transition Note (Signed)
Transition of Care [facility]) - CM/SW Discharge Note   Patient Details  Name: Candace Cain MRN: [id number] Date of Birth: [birth year]  [facility] CM/SW Contact:  Amada Jupiter, LCSW Phone Number: 06/22/2020, 3:27 PM   Clinical Narrative:    Pt medically cleared for dc today.  COVID test with neg results.  Daughter to transport pt to facility.  RN to call report to Rosita at [phone].  No further TOC needs.   Final next level of care: Skilled Nursing Facility Barriers to Discharge: Barriers Resolved   Patient Goals and CMS Choice        Discharge Placement              Patient chooses bed at: [facility] Patient to be transferred to facility by: daughter to take in private vehicle Name of family member notified: daughter    Discharge Plan and Services   Discharge Planning Services: CM Consult            DME Arranged: N/A DME Agency: NA                  Social Determinants of Health (SDOH) Interventions     Readmission Risk Interventions No flowsheet data found.

## 2020-06-22 NOTE — Discharge Summary (Signed)
Physician Discharge Summary  Candace Cain AVW:[id number] DOB: Jul 27, 1920 DOA: 06/12/2020  PCP: Daisy Floro, MD  Admit date: 06/12/2020 Discharge date: 06/22/2020  Admitted From: Assisted living facility Disposition: Skilled nursing rehab  Recommendations for Outpatient Follow-up:  Follow up with PCP in 1-2 weeks Please obtain BMP/CBC in one week Please follow up urology for acute urinary retention now with Foley catheter needs void trial Follow-up with Dr. Matthias Hughs as needed if further GI bleeding occurs  Home Health: None Equipment/Devices: None  Discharge Condition: Stable CODE STATUS: DNR Diet recommendation: Cardiac Brief/Interim Summary:87 y.o. female with medical history significant for atrial fibrillation on Coumadin,, hypothyroidism, perforated diverticulitis status post sigmoid colectomy and colostomy in 2017,  presented to the emergency department red blood per rectum.  Patient reports that she was in her usual state and having an uneventful day until approximately 6 PM when she reports passing a large volume of bright red blood on the toilet.  She has not seen any blood in her colostomy bag, has not had any abdominal or rectal pain, denies trauma, and has never experienced this before.    Discharge Diagnoses:  Principal Problem:   Rectal bleeding Active Problems:   Essential hypertension   Atrial fibrillation (HCC)   Hypothyroidism   Diverticulitis of colon with perforation s/p colectomy/colostomy 12/25/2015    1. Rectal bleeding  Anemia  Abdominal Pain- - Patient was on warfarin for a fib and status post sigmoid colectomy and colostomy in 2017 presents with bright red blood per rectum. Upon admission her initial hemoglobin was 11.6 it dropped to 6.8 and she received 1 unit of blood transfusion during the hospital stay.  Coumadin has been stopped during this hospital stay. Was seen in consultation by [facility] no plans for further investigation.  CT abdomen shows  bladder distention, Foley catheter was placed,which was taken out on 06/20/2020 per patient request however overnight she had problems with voiding and in and out catheterization was done again.  She had Foley catheter replaced since she was not able to urinate.  She will need to follow[facility]up with [facility] for void trial. renal ultrasound- no hydronephrosis has distended bladder  Hemoglobin on discharge was 9.8.   2. Atrial fibrillation continue to hold Coumadin.  She should not be restarted on Coumadin due to recurrent GI bleed requiring blood transfusion.   3. Hypertension blood pressure soft to normal during hospital stay.  She was on Lasix and lisinopril prior to admission both have stopped at the time of discharge due to her blood pressure being normal to low normal.      4 urinary retention now has a Foley for acute urinary retention.  UA negative.  Follow-up with urology for void trials.  5 left foot pain secondary to severe DJD x-ray was done shows DJD to multiple joints in her left foot and great toe.  No evidence of gout.  Uric acid was normal.  Estimated body mass index is 22.31 kg/m as calculated from the following:   Height as of this encounter: 5' 1.5" (1.562 m).   Weight as of this encounter: 54.4 kg.  Discharge Instructions  Discharge Instructions     Diet - low sodium heart healthy   Complete by: As directed    Diet - low sodium heart healthy   Complete by: As directed    Increase activity slowly   Complete by: As directed    Increase activity slowly   Complete by: As directed  Allergies as of 06/22/2020       Reactions   Amlodipine Besylate    Other reaction(s): swelling   Doxycycline    Other reaction(s): diarrhea, muscle stiffness   Hydrocodone Bit-homatrop Mbr    Other reaction(s): itching   Terbinafine Hcl    Other reaction(s): stomach pain,  insomnia   Tramadol Hcl    Other reaction(s): Upset stomach        Medication List     STOP  taking these medications    benazepril 40 MG tablet Commonly known as: LOTENSIN   furosemide 20 MG tablet Commonly known as: LASIX   warfarin 2.5 MG tablet Commonly known as: COUMADIN       TAKE these medications    acetaminophen 325 MG tablet Commonly known as: TYLENOL Take 2 tablets (650 mg total) by mouth every 6 (six) hours as needed for mild pain (or Fever >/= 101).   cyanocobalamin 100 MCG tablet Take 100 mcg by mouth daily.   diclofenac Sodium 1 % Gel Commonly known as: VOLTAREN Apply 2 g topically 4 (four) times daily.   levothyroxine 25 MCG tablet Commonly known as: SYNTHROID Take 25 mcg by mouth daily before breakfast.   nitroGLYCERIN 0.4 MG SL tablet Commonly known as: NITROSTAT Place 0.4 mg under the tongue every 5 (five) minutes as needed for chest pain.   ondansetron 4 MG tablet Commonly known as: ZOFRAN Take 1 tablet (4 mg total) by mouth every 6 (six) hours as needed for nausea.        Follow-up Information     Daisy Floro, MD Follow up.   Specialty: Family Medicine Contact information: [address] [place] [address] [phone]                Allergies  Allergen Reactions   Amlodipine Besylate     Other reaction(s): swelling   Doxycycline     Other reaction(s): diarrhea, muscle stiffness   Hydrocodone Bit-Homatrop Mbr     Other reaction(s): itching   Terbinafine Hcl     Other reaction(s): stomach pain,  insomnia   Tramadol Hcl     Other reaction(s): Upset stomach    Consultations: [facility]   Procedures/Studies: CT ABDOMEN PELVIS WO CONTRAST  Result Date: 06/16/2020 CLINICAL DATA:  Right lower quadrant abdominal pain, lower gastrointestinal hemorrhage EXAM: CT ABDOMEN AND PELVIS WITHOUT CONTRAST TECHNIQUE: Multidetector CT imaging of the abdomen and pelvis was performed following the standard protocol without IV contrast. COMPARISON:  12/31/2015 FINDINGS: Lower chest: The visualized mild cylindrical  bronchiectasis and subpleural fibrosis is noted within the visualized lung bases. Extensive multi-vessel coronary artery calcification is noted. Global cardiac size within normal limits. Central pulmonary arteries are enlarged in keeping with changes of pulmonary arterial hypertension. No pericardial effusion. Moderate atherosclerotic calcification noted within the thoracic aorta without evidence of aneurysm Hepatobiliary: No focal liver abnormality is seen. No gallstones, gallbladder wall thickening, or biliary dilatation. Pancreas: Unremarkable Spleen: Unremarkable Adrenals/Urinary Tract: The adrenal glands are unremarkable. The kidneys are normal. The bladder is markedly distended. Streak artifact related to right total hip arthroplasty slightly limits evaluation of the bladder. Stomach/Bowel: Large left lower quadrant spigelian hernia contains the distal descending and proximal sigmoid colon without evidence of obstruction. Small supraumbilical ventral hernia contains a single loop of unremarkable mid small bowel. There is severe distal sigmoid diverticulosis without superimposed inflammatory change. The stomach, small bowel, and large bowel are otherwise unremarkable. Appendix normal. No free intraperitoneal gas or fluid. Vascular/Lymphatic: There  is moderate aortoiliac atherosclerotic calcification with particularly prominent atherosclerotic calcifications seen within the a superior mesenteric artery proximally as well as the or right renal artery origin. The degree of stenosis is not well assessed on this noncontrast examination. No aortic aneurysm. No pathologic adenopathy within the abdomen and pelvis. Reproductive: Status post hysterectomy. No adnexal masses. Other: Small left inguinal lipoma.  The rectum is unremarkable. Musculoskeletal: Right total hip arthroplasty has been performed. No acute bone abnormality. Osseous structures are age-appropriate. IMPRESSION: Marked distension of the bladder, possibly  reflecting changes of bladder outlet obstruction. Severe distal sigmoid diverticulosis without superimposed inflammatory change. Small supraumbilical ventral hernia and large left lower quadrant spigelian hernia containing loops of small and large bowel, respectively without evidence of obstruction. Extensive multi-vessel coronary artery calcification. Morphologic changes in keeping with pulmonary arterial hypertension. Mild bibasilar cylindrical bronchiectasis and subpleural fibrosis. Peripheral vascular disease. If there is clinical evidence of chronic mesenteric ischemia or hemodynamically significant renal artery stenosis, CT arteriography may be more helpful for further evaluation. Aortic Atherosclerosis (ICD10-I70.0). Electronically Signed   By: Helyn NumbersAshesh  Parikh MD   On: 06/16/2020 20:44   DG Abd 1 View  Result Date: 06/19/2020 CLINICAL DATA:  Abdominal pain. EXAM: ABDOMEN - 1 VIEW COMPARISON:  December 25, 2015. FINDINGS: The bowel gas pattern is normal. Residual contrast is noted in the transverse and descending colon. Nasogastric tube tip is seen in expected position of proximal stomach. No radio-opaque calculi or other significant radiographic abnormality are seen. IMPRESSION: No evidence of bowel obstruction or ileus. Electronically Signed   By: Lupita RaiderJames  Green Jr M.D.   On: 06/19/2020 13:48   US RENAL  Result Date: 06/17/2020 CLINICAL DATA:  Abdominal distension. EXAM: RENAL / URINARY TRACT ULTRASOUND COMPLETE COMPARISON:  None. FINDINGS: Right Kidney: Renal measurements: 9.8 cm x 4.4 cm x 4.0 cm = volume: 90.6 mL. Echogenicity within normal limits. No mass or hydronephrosis visualized. Left Kidney: Renal measurements: 8.5 cm x 4.7 cm x 4.8 cm = volume: 99.2 mL. Echogenicity within normal limits. No mass or hydronephrosis visualized. Bladder: The urinary bladder is distended. Other: None. IMPRESSION: 1. Distended urinary bladder. 2. Otherwise unremarkable renal ultrasound. Electronically Signed   By:  Aram Candelahaddeus  Houston M.D.   On: 06/17/2020 18:54   DG Foot 2 Views Left  Result Date: 06/20/2020 CLINICAL DATA:  Pain left foot.  Especially when bearing weight. EXAM: LEFT FOOT - 2 VIEW COMPARISON:  None. FINDINGS: Diffusely decreased bone density. No cortical erosion or destruction. No evidence of fracture, dislocation, or joint effusion. Degenerative changes of the midfoot. Degenerative changes of the interphalangeal joints. Degenerative changes of the first digit metatarsophalangeal joint. No evidence of severe arthropathy. No aggressive appearing focal bone abnormality. Soft tissues are unremarkable. IMPRESSION: No acute displaced fracture or dislocation. Electronically Signed   By: Tish FredericksonMorgane  Naveau M.D.   On: 06/20/2020 17:09   VAS US LOWER EXTREMITY VENOUS (DVT)  Result Date: 06/19/2020  Lower Venous DVT Study Patient Name:  Candace Cain  Date of Exam:   06/19/2020 Medical Rec #: [id number]     Accession #:    [id number] Date of Birth: [birth year]     Patient Gender: F Patient Age:   89Y [facility] Procedure:      VAS US LOWER EXTREMITY VENOUS (DVT) Referring Phys: [id number] Gardiner RamusELIZABETH G Myrta Mercer --------------------------------------------------------------------------------  Indications: "edema" per MD orders - no edema present on exam.  Anticoagulation: Coumadin prior to admission for Afib (GI bleed). Limitations: Patient unable to follow  instructions - poor positioning. Comparison Study: No previous exams Performing Technologist: Jody Hill RVT, RDMS  Examination Guidelines: A complete evaluation includes B-mode imaging, spectral Doppler, color Doppler, and power Doppler as needed of all accessible portions of each vessel. Bilateral testing is considered an integral part of a complete examination. Limited examinations for reoccurring indications may be performed as noted. The reflux portion of the exam is performed with the patient in reverse Trendelenburg.   +---------+---------------+---------+-----------+----------+-------------------+ RIGHT    CompressibilityPhasicitySpontaneityPropertiesThrombus Aging      +---------+---------------+---------+-----------+----------+-------------------+ CFV      Full           Yes      Yes                                      +---------+---------------+---------+-----------+----------+-------------------+ SFJ      Full                                                             +---------+---------------+---------+-----------+----------+-------------------+ FV Prox  Full           Yes      Yes                                      +---------+---------------+---------+-----------+----------+-------------------+ FV Mid   Full           Yes      Yes                                      +---------+---------------+---------+-----------+----------+-------------------+ FV DistalFull           Yes      Yes                                      +---------+---------------+---------+-----------+----------+-------------------+ PFV      Full                                                             +---------+---------------+---------+-----------+----------+-------------------+ POP      Full           Yes      Yes                                      +---------+---------------+---------+-----------+----------+-------------------+ PTV      Full                                                             +---------+---------------+---------+-----------+----------+-------------------+ PERO     Full  Not well visualized +---------+---------------+---------+-----------+----------+-------------------+   +---------+---------------+---------+-----------+----------+-------------------+ LEFT     CompressibilityPhasicitySpontaneityPropertiesThrombus Aging      +---------+---------------+---------+-----------+----------+-------------------+  CFV      Full           Yes      Yes                                      +---------+---------------+---------+-----------+----------+-------------------+ SFJ      Full                                                             +---------+---------------+---------+-----------+----------+-------------------+ FV Prox  Full           Yes      Yes                                      +---------+---------------+---------+-----------+----------+-------------------+ FV Mid   Full           Yes      Yes                                      +---------+---------------+---------+-----------+----------+-------------------+ FV DistalFull           Yes      Yes                                      +---------+---------------+---------+-----------+----------+-------------------+ PFV      Full                                                             +---------+---------------+---------+-----------+----------+-------------------+ POP      Full           Yes      Yes                                      +---------+---------------+---------+-----------+----------+-------------------+ PTV      Full                                         Not well visualized +---------+---------------+---------+-----------+----------+-------------------+ PERO     Full                                         Not well visualized +---------+---------------+---------+-----------+----------+-------------------+     Summary: BILATERAL: - No evidence of deep vein thrombosis seen in the lower extremities, bilaterally. - No evidence of superficial venous thrombosis in the lower extremities, bilaterally. -No evidence of popliteal cyst, bilaterally.   *See table(s) above  for measurements and observations. Electronically signed by Heath Lark on 06/19/2020 at 9:18:50 PM.    Final    (Echo, Carotid, EGD, Colonoscopy, ERCP)    Subjective: Patient resting in bed no new complaints  Discharge  Exam: Vitals:   06/22/20 0449 06/22/20 1333  BP: 130/63 103/70  Pulse: 61 (!) 55  Resp: 16 16  Temp: 97.6 F (36.4 C) 97.6 F (36.4 C)  SpO2: 94% 97%   Vitals:   06/21/20 0445 06/21/20 2102 06/22/20 0449 06/22/20 1333  BP: (!) 145/64 128/68 130/63 103/70  Pulse: 72 65 61 (!) 55  Resp: Temp: 98.7 F (37.1 C) 100 F (37.8 C) 97.6 F (36.4 C) 97.6 F (36.4 C)  TempSrc: Oral Oral Oral Oral  SpO2: 94% 94% 94% 97%  Weight:      Height:        General: Pt is alert, awake, not in acute distress Cardiovascular: RRR, S1/S2 +, no rubs, no gallops Respiratory: CTA bilaterally, no wheezing, no rhonchi Abdominal: Soft, NT, ND, bowel sounds + Extremities: no edema, no cyanosis    The results of significant diagnostics from this hospitalization (including imaging, microbiology, ancillary and laboratory) are listed below for reference.     Microbiology: Recent Results (from the past 240 hour(s))  Resp Panel by RT-PCR (Flu A&B, Covid) Nasopharyngeal Swab     Status: None   Collection Time: 06/12/20 10:44 PM   Specimen: Nasopharyngeal Swab; Nasopharyngeal(NP) swabs in vial transport medium  Result Value Ref Range Status   SARS Coronavirus 2 by RT PCR NEGATIVE NEGATIVE Final    Comment: (NOTE) SARS-CoV-2 target nucleic acids are NOT DETECTED.  The SARS-CoV-2 RNA is generally detectable in upper respiratory specimens during the acute phase of infection. The lowest concentration of SARS-CoV-2 viral copies this assay can detect is 138 copies/mL. A negative result does not preclude SARS-Cov-2 infection and should not be used as the sole basis for treatment or other patient management decisions. A negative result may occur with  improper specimen collection/handling, submission of specimen other than nasopharyngeal swab, presence of viral mutation(s) within the areas targeted by this assay, and inadequate number of viral copies(<138 copies/mL). A negative result must be  combined with clinical observations, patient history, and epidemiological information. The expected result is Negative.  Fact Sheet for Patients:  BloggerCourse.com  Fact Sheet for Healthcare Providers:  SeriousBroker.it  This test is no t yet approved or cleared by the [place] FDA and  has been authorized for detection and/or diagnosis of SARS-CoV-2 by FDA under an Emergency Use Authorization (EUA). This EUA will remain  in effect (meaning this test can be used) for the duration of the COVID-19 declaration under Section 564(b)(1) of the Act, 21 U.S.C.section 360bbb-3(b)(1), unless the authorization is terminated  or revoked sooner.       Influenza A by PCR NEGATIVE NEGATIVE Final   Influenza B by PCR NEGATIVE NEGATIVE Final    Comment: (NOTE) The Xpert Xpress SARS-CoV-2/FLU/RSV plus assay is intended as an aid in the diagnosis of influenza from Nasopharyngeal swab specimens and should not be used as a sole basis for treatment. Nasal washings and aspirates are unacceptable for Xpert Xpress SARS-CoV-2/FLU/RSV testing.  Fact Sheet for Patients: BloggerCourse.com  Fact Sheet for Healthcare Providers: SeriousBroker.it  This test is not yet approved or cleared by the [place] FDA and has been authorized for detection and/or diagnosis of SARS-CoV-2 by FDA under an Emergency Use Authorization (EUA). This EUA will  remain in effect (meaning this test can be used) for the duration of the COVID-19 declaration under Section 564(b)(1) of the Act, 21 U.S.C. section 360bbb-3(b)(1), unless the authorization is terminated or revoked.  Performed at [facility], [address], [place], [place] [address]      Labs: BNP (last 3 results) No results for input(s): BNP in the last 8760 hours. Basic Metabolic Panel: Recent Labs  Lab 06/16/20 0525 06/19/20 0540  06/20/20 1326  NA 137 138 137  K 3.6 3.9 4.1  CL 108 105 101  CO2 GLUCOSE 96 91 103*  BUN CREATININE 0.71 0.47 0.67  CALCIUM 8.6* 8.5* 8.9  MG 1.9  --   --   PHOS 2.9  --   --    Liver Function Tests: Recent Labs  Lab 06/16/20 0525 06/19/20 0540 06/20/20 1326  AST ALT ALKPHOS 43 43 55  BILITOT 0.6 0.5 0.6  PROT 5.2* 5.6* 7.0  ALBUMIN 3.0* 2.9* 3.4*   No results for input(s): LIPASE, AMYLASE in the last 168 hours. No results for input(s): AMMONIA in the last 168 hours. CBC: Recent Labs  Lab 06/16/20 0525 06/16/20 1256 06/19/20 0540 06/19/20 1309 06/19/20 2031 06/20/20 1326 06/21/20 0601 06/22/20 0906  WBC 6.1  --  7.1  --   --  12.0* 9.0 9.6  NEUTROABS 3.9  --   --   --   --   --   --   --   HGB 7.5*   < > 7.9* 8.8* 8.5* 10.3* 9.4* 9.3*  HCT 24.9*   < > 24.1* 27.6* 27.3* 32.6* 27.6* 28.7*  MCV 98.4  --  91.6  --   --  94.5 89.0 93.2  PLT 210  --  239  --   --  344 95* 317   < > = values in this interval not displayed.   Cardiac Enzymes: No results for input(s): CKTOTAL, CKMB, CKMBINDEX, TROPONINI in the last 168 hours. BNP: Invalid input(s): POCBNP CBG: No results for input(s): GLUCAP in the last 168 hours. D-Dimer No results for input(s): DDIMER in the last 72 hours. Hgb A1c No results for input(s): HGBA1C in the last 72 hours. Lipid Profile No results for input(s): CHOL, HDL, LDLCALC, TRIG, CHOLHDL, LDLDIRECT in the last 72 hours. Thyroid function studies No results for input(s): TSH, T4TOTAL, T3FREE, THYROIDAB in the last 72 hours.  Invalid input(s): FREET3 Anemia work up No results for input(s): VITAMINB12, FOLATE, FERRITIN, TIBC, IRON, RETICCTPCT in the last 72 hours. Urinalysis    Component Value Date/Time   COLORURINE YELLOW 06/21/2020 0453   APPEARANCEUR CLOUDY (A) 06/21/2020 0453   LABSPEC 1.017 06/21/2020 0453   PHURINE 5.0 06/21/2020 0453   GLUCOSEU NEGATIVE 06/21/2020 0453   HGBUR SMALL (A)  06/21/2020 0453   BILIRUBINUR NEGATIVE 06/21/2020 0453   KETONESUR NEGATIVE 06/21/2020 0453   PROTEINUR NEGATIVE 06/21/2020 0453   UROBILINOGEN 0.2 04/25/2014 2228   NITRITE NEGATIVE 06/21/2020 0453   LEUKOCYTESUR LARGE (A) 06/21/2020 0453   Sepsis Labs Invalid input(s): PROCALCITONIN,  WBC,  LACTICIDVEN Microbiology Recent Results (from the past 240 hour(s))  Resp Panel by RT-PCR (Flu A&B, Covid) Nasopharyngeal Swab     Status: None   Collection Time: 06/12/20 10:44 PM   Specimen: Nasopharyngeal Swab; Nasopharyngeal(NP) swabs in vial transport medium  Result Value Ref Range Status   SARS Coronavirus 2 by RT PCR NEGATIVE NEGATIVE Final  Comment: (NOTE) SARS-CoV-2 target nucleic acids are NOT DETECTED.  The SARS-CoV-2 RNA is generally detectable in upper respiratory specimens during the acute phase of infection. The lowest concentration of SARS-CoV-2 viral copies this assay can detect is 138 copies/mL. A negative result does not preclude SARS-Cov-2 infection and should not be used as the sole basis for treatment or other patient management decisions. A negative result may occur with  improper specimen collection/handling, submission of specimen other than nasopharyngeal swab, presence of viral mutation(s) within the areas targeted by this assay, and inadequate number of viral copies(<138 copies/mL). A negative result must be combined with clinical observations, patient history, and epidemiological information. The expected result is Negative.  Fact Sheet for Patients:  BloggerCourse.com  Fact Sheet for Healthcare Providers:  SeriousBroker.it  This test is no t yet approved or cleared by the [place] FDA and  has been authorized for detection and/or diagnosis of SARS-CoV-2 by FDA under an Emergency Use Authorization (EUA). This EUA will remain  in effect (meaning this test can be used) for the duration of the COVID-19  declaration under Section 564(b)(1) of the Act, 21 U.S.C.section 360bbb-3(b)(1), unless the authorization is terminated  or revoked sooner.       Influenza A by PCR NEGATIVE NEGATIVE Final   Influenza B by PCR NEGATIVE NEGATIVE Final    Comment: (NOTE) The Xpert Xpress SARS-CoV-2/FLU/RSV plus assay is intended as an aid in the diagnosis of influenza from Nasopharyngeal swab specimens and should not be used as a sole basis for treatment. Nasal washings and aspirates are unacceptable for Xpert Xpress SARS-CoV-2/FLU/RSV testing.  Fact Sheet for Patients: BloggerCourse.com  Fact Sheet for Healthcare Providers: SeriousBroker.it  This test is not yet approved or cleared by the [place] FDA and has been authorized for detection and/or diagnosis of SARS-CoV-2 by FDA under an Emergency Use Authorization (EUA). This EUA will remain in effect (meaning this test can be used) for the duration of the COVID-19 declaration under Section 564(b)(1) of the Act, 21 U.S.C. section 360bbb-3(b)(1), unless the authorization is terminated or revoked.  Performed at [facility], [address], [place], [place] [address]      Time coordinating discharge: 38 minutes  SIGNED:   Alwyn Ren, MD  [facility] 06/22/2020, 1:34 PM

## 2020-06-23 DIAGNOSIS — I10 Essential (primary) hypertension: Secondary | ICD-10-CM | POA: Diagnosis not present

## 2020-06-23 DIAGNOSIS — D62 Acute posthemorrhagic anemia: Secondary | ICD-10-CM | POA: Diagnosis not present

## 2020-06-23 DIAGNOSIS — R339 Retention of urine, unspecified: Secondary | ICD-10-CM | POA: Diagnosis not present

## 2020-06-23 DIAGNOSIS — I480 Paroxysmal atrial fibrillation: Secondary | ICD-10-CM | POA: Diagnosis not present

## 2020-06-26 DIAGNOSIS — D62 Acute posthemorrhagic anemia: Secondary | ICD-10-CM | POA: Diagnosis not present

## 2020-06-26 DIAGNOSIS — R339 Retention of urine, unspecified: Secondary | ICD-10-CM | POA: Diagnosis not present

## 2020-06-26 DIAGNOSIS — M25561 Pain in right knee: Secondary | ICD-10-CM | POA: Diagnosis not present

## 2020-06-26 DIAGNOSIS — I480 Paroxysmal atrial fibrillation: Secondary | ICD-10-CM | POA: Diagnosis not present

## 2020-06-29 DIAGNOSIS — R339 Retention of urine, unspecified: Secondary | ICD-10-CM | POA: Diagnosis not present

## 2020-06-29 DIAGNOSIS — R8290 Unspecified abnormal findings in urine: Secondary | ICD-10-CM | POA: Diagnosis not present

## 2020-06-29 DIAGNOSIS — Z978 Presence of other specified devices: Secondary | ICD-10-CM | POA: Diagnosis not present

## 2020-06-29 DIAGNOSIS — R600 Localized edema: Secondary | ICD-10-CM | POA: Diagnosis not present

## 2020-07-02 DIAGNOSIS — K5909 Other constipation: Secondary | ICD-10-CM | POA: Diagnosis not present

## 2020-07-02 DIAGNOSIS — Z933 Colostomy status: Secondary | ICD-10-CM | POA: Diagnosis not present

## 2020-07-02 DIAGNOSIS — Z978 Presence of other specified devices: Secondary | ICD-10-CM | POA: Diagnosis not present

## 2020-07-02 DIAGNOSIS — R339 Retention of urine, unspecified: Secondary | ICD-10-CM | POA: Diagnosis not present

## 2020-07-06 DIAGNOSIS — K5909 Other constipation: Secondary | ICD-10-CM | POA: Diagnosis not present

## 2020-07-06 DIAGNOSIS — Z933 Colostomy status: Secondary | ICD-10-CM | POA: Diagnosis not present

## 2020-07-06 DIAGNOSIS — T83511A Infection and inflammatory reaction due to indwelling urethral catheter, initial encounter: Secondary | ICD-10-CM | POA: Diagnosis not present

## 2020-07-06 DIAGNOSIS — B372 Candidiasis of skin and nail: Secondary | ICD-10-CM | POA: Diagnosis not present

## 2020-07-08 DIAGNOSIS — Z8719 Personal history of other diseases of the digestive system: Secondary | ICD-10-CM | POA: Diagnosis not present

## 2020-07-08 DIAGNOSIS — N3000 Acute cystitis without hematuria: Secondary | ICD-10-CM | POA: Diagnosis not present

## 2020-07-08 DIAGNOSIS — Z933 Colostomy status: Secondary | ICD-10-CM | POA: Diagnosis not present

## 2020-07-08 DIAGNOSIS — R339 Retention of urine, unspecified: Secondary | ICD-10-CM | POA: Diagnosis not present

## 2020-07-09 DIAGNOSIS — R609 Edema, unspecified: Secondary | ICD-10-CM | POA: Diagnosis not present

## 2020-07-09 DIAGNOSIS — R531 Weakness: Secondary | ICD-10-CM | POA: Diagnosis not present

## 2020-07-09 DIAGNOSIS — F411 Generalized anxiety disorder: Secondary | ICD-10-CM | POA: Diagnosis not present

## 2020-07-10 DIAGNOSIS — F411 Generalized anxiety disorder: Secondary | ICD-10-CM | POA: Diagnosis not present

## 2020-07-10 DIAGNOSIS — R339 Retention of urine, unspecified: Secondary | ICD-10-CM | POA: Diagnosis not present

## 2020-07-10 DIAGNOSIS — N3000 Acute cystitis without hematuria: Secondary | ICD-10-CM | POA: Diagnosis not present

## 2020-07-10 DIAGNOSIS — R609 Edema, unspecified: Secondary | ICD-10-CM | POA: Diagnosis not present

## 2020-07-15 DIAGNOSIS — F411 Generalized anxiety disorder: Secondary | ICD-10-CM | POA: Diagnosis not present

## 2020-07-15 DIAGNOSIS — N3000 Acute cystitis without hematuria: Secondary | ICD-10-CM | POA: Diagnosis not present

## 2020-07-15 DIAGNOSIS — R609 Edema, unspecified: Secondary | ICD-10-CM | POA: Diagnosis not present

## 2020-07-15 DIAGNOSIS — R531 Weakness: Secondary | ICD-10-CM | POA: Diagnosis not present

## 2020-07-17 DIAGNOSIS — D649 Anemia, unspecified: Secondary | ICD-10-CM | POA: Diagnosis not present

## 2020-07-17 DIAGNOSIS — I480 Paroxysmal atrial fibrillation: Secondary | ICD-10-CM | POA: Diagnosis not present

## 2020-07-17 DIAGNOSIS — D62 Acute posthemorrhagic anemia: Secondary | ICD-10-CM | POA: Diagnosis not present

## 2020-07-20 DIAGNOSIS — I480 Paroxysmal atrial fibrillation: Secondary | ICD-10-CM | POA: Diagnosis not present

## 2020-07-20 DIAGNOSIS — D62 Acute posthemorrhagic anemia: Secondary | ICD-10-CM | POA: Diagnosis not present

## 2020-07-21 DIAGNOSIS — D62 Acute posthemorrhagic anemia: Secondary | ICD-10-CM | POA: Diagnosis not present

## 2020-07-21 DIAGNOSIS — I480 Paroxysmal atrial fibrillation: Secondary | ICD-10-CM | POA: Diagnosis not present

## 2020-07-22 DIAGNOSIS — I480 Paroxysmal atrial fibrillation: Secondary | ICD-10-CM | POA: Diagnosis not present

## 2020-07-22 DIAGNOSIS — D62 Acute posthemorrhagic anemia: Secondary | ICD-10-CM | POA: Diagnosis not present

## 2020-07-24 DIAGNOSIS — I480 Paroxysmal atrial fibrillation: Secondary | ICD-10-CM | POA: Diagnosis not present

## 2020-07-24 DIAGNOSIS — D62 Acute posthemorrhagic anemia: Secondary | ICD-10-CM | POA: Diagnosis not present

## 2020-07-27 DIAGNOSIS — R531 Weakness: Secondary | ICD-10-CM | POA: Diagnosis not present

## 2020-07-27 DIAGNOSIS — I10 Essential (primary) hypertension: Secondary | ICD-10-CM | POA: Diagnosis not present

## 2020-07-27 DIAGNOSIS — I480 Paroxysmal atrial fibrillation: Secondary | ICD-10-CM | POA: Diagnosis not present

## 2020-07-27 DIAGNOSIS — M25551 Pain in right hip: Secondary | ICD-10-CM | POA: Diagnosis not present

## 2020-07-27 DIAGNOSIS — D62 Acute posthemorrhagic anemia: Secondary | ICD-10-CM | POA: Diagnosis not present

## 2020-07-27 DIAGNOSIS — M25561 Pain in right knee: Secondary | ICD-10-CM | POA: Diagnosis not present

## 2020-07-28 DIAGNOSIS — I480 Paroxysmal atrial fibrillation: Secondary | ICD-10-CM | POA: Diagnosis not present

## 2020-07-28 DIAGNOSIS — D62 Acute posthemorrhagic anemia: Secondary | ICD-10-CM | POA: Diagnosis not present

## 2020-08-11 DIAGNOSIS — I10 Essential (primary) hypertension: Secondary | ICD-10-CM | POA: Diagnosis not present

## 2020-08-11 DIAGNOSIS — M169 Osteoarthritis of hip, unspecified: Secondary | ICD-10-CM | POA: Diagnosis not present

## 2020-08-11 DIAGNOSIS — Z136 Encounter for screening for cardiovascular disorders: Secondary | ICD-10-CM | POA: Diagnosis not present

## 2020-08-11 DIAGNOSIS — E039 Hypothyroidism, unspecified: Secondary | ICD-10-CM | POA: Diagnosis not present

## 2020-08-11 DIAGNOSIS — D509 Iron deficiency anemia, unspecified: Secondary | ICD-10-CM | POA: Diagnosis not present

## 2020-08-18 DIAGNOSIS — M25551 Pain in right hip: Secondary | ICD-10-CM | POA: Diagnosis not present

## 2020-08-18 DIAGNOSIS — Z0000 Encounter for general adult medical examination without abnormal findings: Secondary | ICD-10-CM | POA: Diagnosis not present

## 2020-10-06 DIAGNOSIS — B9689 Other specified bacterial agents as the cause of diseases classified elsewhere: Secondary | ICD-10-CM | POA: Diagnosis not present

## 2020-10-06 DIAGNOSIS — N390 Urinary tract infection, site not specified: Secondary | ICD-10-CM | POA: Diagnosis not present

## 2020-10-07 DIAGNOSIS — R339 Retention of urine, unspecified: Secondary | ICD-10-CM | POA: Diagnosis not present

## 2020-10-07 DIAGNOSIS — R300 Dysuria: Secondary | ICD-10-CM | POA: Diagnosis not present

## 2020-10-07 DIAGNOSIS — N3000 Acute cystitis without hematuria: Secondary | ICD-10-CM | POA: Diagnosis not present

## 2020-12-17 DIAGNOSIS — N390 Urinary tract infection, site not specified: Secondary | ICD-10-CM | POA: Diagnosis not present

## 2020-12-18 DIAGNOSIS — N3000 Acute cystitis without hematuria: Secondary | ICD-10-CM | POA: Diagnosis not present

## 2020-12-18 DIAGNOSIS — R350 Frequency of micturition: Secondary | ICD-10-CM | POA: Diagnosis not present

## 2020-12-18 DIAGNOSIS — Z87440 Personal history of urinary (tract) infections: Secondary | ICD-10-CM | POA: Diagnosis not present

## 2020-12-23 DIAGNOSIS — M25551 Pain in right hip: Secondary | ICD-10-CM | POA: Diagnosis not present

## 2021-01-13 DIAGNOSIS — H903 Sensorineural hearing loss, bilateral: Secondary | ICD-10-CM | POA: Diagnosis not present

## 2021-01-13 DIAGNOSIS — H6123 Impacted cerumen, bilateral: Secondary | ICD-10-CM | POA: Diagnosis not present

## 2021-01-14 DIAGNOSIS — S0001XA Abrasion of scalp, initial encounter: Secondary | ICD-10-CM | POA: Diagnosis not present

## 2021-01-14 DIAGNOSIS — R2689 Other abnormalities of gait and mobility: Secondary | ICD-10-CM | POA: Diagnosis not present

## 2021-01-14 DIAGNOSIS — M6281 Muscle weakness (generalized): Secondary | ICD-10-CM | POA: Diagnosis not present

## 2021-01-14 DIAGNOSIS — R52 Pain, unspecified: Secondary | ICD-10-CM | POA: Diagnosis not present

## 2021-03-02 DIAGNOSIS — E860 Dehydration: Secondary | ICD-10-CM | POA: Diagnosis not present

## 2021-03-11 DIAGNOSIS — Z87440 Personal history of urinary (tract) infections: Secondary | ICD-10-CM | POA: Diagnosis not present

## 2021-03-11 DIAGNOSIS — R300 Dysuria: Secondary | ICD-10-CM | POA: Diagnosis not present

## 2021-04-02 DIAGNOSIS — N3000 Acute cystitis without hematuria: Secondary | ICD-10-CM | POA: Diagnosis not present

## 2021-04-04 DIAGNOSIS — N390 Urinary tract infection, site not specified: Secondary | ICD-10-CM | POA: Diagnosis not present

## 2021-04-15 DIAGNOSIS — M25551 Pain in right hip: Secondary | ICD-10-CM | POA: Diagnosis not present

## 2021-06-14 DIAGNOSIS — M79671 Pain in right foot: Secondary | ICD-10-CM | POA: Diagnosis not present

## 2021-06-15 DIAGNOSIS — M19071 Primary osteoarthritis, right ankle and foot: Secondary | ICD-10-CM | POA: Diagnosis not present

## 2021-06-28 DIAGNOSIS — M6281 Muscle weakness (generalized): Secondary | ICD-10-CM | POA: Diagnosis not present

## 2021-06-28 DIAGNOSIS — Z9181 History of falling: Secondary | ICD-10-CM | POA: Diagnosis not present

## 2021-06-28 DIAGNOSIS — Z87440 Personal history of urinary (tract) infections: Secondary | ICD-10-CM | POA: Diagnosis not present

## 2021-06-28 DIAGNOSIS — R2689 Other abnormalities of gait and mobility: Secondary | ICD-10-CM | POA: Diagnosis not present

## 2021-06-30 DIAGNOSIS — R410 Disorientation, unspecified: Secondary | ICD-10-CM | POA: Diagnosis not present

## 2021-06-30 DIAGNOSIS — R300 Dysuria: Secondary | ICD-10-CM | POA: Diagnosis not present

## 2021-07-21 DIAGNOSIS — R300 Dysuria: Secondary | ICD-10-CM | POA: Diagnosis not present

## 2021-07-21 DIAGNOSIS — N390 Urinary tract infection, site not specified: Secondary | ICD-10-CM | POA: Diagnosis not present

## 2021-08-03 DIAGNOSIS — N390 Urinary tract infection, site not specified: Secondary | ICD-10-CM | POA: Diagnosis not present

## 2021-08-04 DIAGNOSIS — N390 Urinary tract infection, site not specified: Secondary | ICD-10-CM | POA: Diagnosis not present

## 2021-08-05 DIAGNOSIS — M25551 Pain in right hip: Secondary | ICD-10-CM | POA: Diagnosis not present

## 2021-08-24 DIAGNOSIS — R300 Dysuria: Secondary | ICD-10-CM | POA: Diagnosis not present

## 2021-08-26 DIAGNOSIS — R351 Nocturia: Secondary | ICD-10-CM | POA: Diagnosis not present

## 2021-10-08 DIAGNOSIS — Z933 Colostomy status: Secondary | ICD-10-CM | POA: Diagnosis not present

## 2021-10-08 DIAGNOSIS — K631 Perforation of intestine (nontraumatic): Secondary | ICD-10-CM | POA: Diagnosis not present

## 2021-10-15 DIAGNOSIS — Z933 Colostomy status: Secondary | ICD-10-CM | POA: Diagnosis not present

## 2021-10-15 DIAGNOSIS — K631 Perforation of intestine (nontraumatic): Secondary | ICD-10-CM | POA: Diagnosis not present

## 2021-12-08 DIAGNOSIS — M25551 Pain in right hip: Secondary | ICD-10-CM | POA: Diagnosis not present

## 2021-12-14 DIAGNOSIS — I10 Essential (primary) hypertension: Secondary | ICD-10-CM | POA: Diagnosis not present

## 2021-12-14 DIAGNOSIS — I509 Heart failure, unspecified: Secondary | ICD-10-CM | POA: Diagnosis not present

## 2021-12-14 DIAGNOSIS — E039 Hypothyroidism, unspecified: Secondary | ICD-10-CM | POA: Diagnosis not present

## 2021-12-14 DIAGNOSIS — Z0000 Encounter for general adult medical examination without abnormal findings: Secondary | ICD-10-CM | POA: Diagnosis not present

## 2021-12-14 DIAGNOSIS — D509 Iron deficiency anemia, unspecified: Secondary | ICD-10-CM | POA: Diagnosis not present

## 2022-01-14 DIAGNOSIS — H6123 Impacted cerumen, bilateral: Secondary | ICD-10-CM | POA: Diagnosis not present

## 2022-03-17 DIAGNOSIS — M25551 Pain in right hip: Secondary | ICD-10-CM | POA: Diagnosis not present

## 2022-05-09 DIAGNOSIS — Z974 Presence of external hearing-aid: Secondary | ICD-10-CM | POA: Diagnosis not present

## 2022-05-09 DIAGNOSIS — H6123 Impacted cerumen, bilateral: Secondary | ICD-10-CM | POA: Diagnosis not present

## 2022-07-04 DIAGNOSIS — K080 Exfoliation of teeth due to systemic causes: Secondary | ICD-10-CM | POA: Diagnosis not present

## 2022-07-07 DIAGNOSIS — K080 Exfoliation of teeth due to systemic causes: Secondary | ICD-10-CM | POA: Diagnosis not present

## 2022-08-05 DIAGNOSIS — N390 Urinary tract infection, site not specified: Secondary | ICD-10-CM | POA: Diagnosis not present

## 2022-08-08 DIAGNOSIS — K080 Exfoliation of teeth due to systemic causes: Secondary | ICD-10-CM | POA: Diagnosis not present

## 2022-10-13 DIAGNOSIS — K080 Exfoliation of teeth due to systemic causes: Secondary | ICD-10-CM | POA: Diagnosis not present

## 2022-11-25 ENCOUNTER — Encounter (INDEPENDENT_AMBULATORY_CARE_PROVIDER_SITE_OTHER): Payer: Self-pay

## 2022-11-25 ENCOUNTER — Ambulatory Visit (INDEPENDENT_AMBULATORY_CARE_PROVIDER_SITE_OTHER): Payer: Medicare Other

## 2022-11-25 VITALS — Ht 61.0 in | Wt 120.0 lb

## 2022-11-25 DIAGNOSIS — H6123 Impacted cerumen, bilateral: Secondary | ICD-10-CM

## 2022-11-25 DIAGNOSIS — H903 Sensorineural hearing loss, bilateral: Secondary | ICD-10-CM

## 2022-11-28 DIAGNOSIS — H6123 Impacted cerumen, bilateral: Secondary | ICD-10-CM | POA: Insufficient documentation

## 2022-11-28 DIAGNOSIS — H903 Sensorineural hearing loss, bilateral: Secondary | ICD-10-CM | POA: Insufficient documentation

## 2022-11-28 NOTE — Progress Notes (Signed)
Patient ID: Candace Cain, female   DOB: April 03, 1920, 89 y.o.   MRN: [id number]  Follow-up: Progressive hearing loss  HPI: The patient is a 89 year old female who returns today for her follow-up evaluation.  The patient was previously seen for her progressive hearing loss.  She was fitted with bilateral hearing aids.  The patient returns today complaining that her hearing aids are constantly ringing, likely secondary to cerumen accumulation in the tubing.  Currently she denies any otalgia, otorrhea, or vertigo.  The hearing aids have helped with her hearing.  Exam: General: Communicates without difficulty, well nourished, no acute distress. Head: Normocephalic, no evidence injury, no tenderness, facial buttresses intact without stepoff. Face/sinus: No tenderness to palpation and percussion. Facial movement is normal and symmetric. Eyes: PERRL, EOMI. No scleral icterus, conjunctivae clear. Neuro: CN II exam reveals vision grossly intact.  No nystagmus at any point of gaze. Ears: Auricles well formed without lesions.  Bilateral cerumen impaction.  Nose: External evaluation reveals normal support and skin without lesions.  Dorsum is intact.  Anterior rhinoscopy reveals congested mucosa over anterior aspect of inferior turbinates and intact septum.  No purulence noted. Oral:  Oral cavity and oropharynx are intact, symmetric, without erythema or edema.  Mucosa is moist without lesions. Neck: Full range of motion without pain.  There is no significant lymphadenopathy.  No masses palpable.  Thyroid bed within normal limits to palpation.  Parotid glands and submandibular glands equal bilaterally without mass.  Trachea is midline. Neuro:  CN 2-12 grossly intact.    Procedure: Bilateral cerumen disimpaction Anesthesia: None Description: Under the operating microscope, the cerumen is carefully removed with a combination of cerumen currette, alligator forceps, and suction catheters.  After the cerumen is removed, the  TMs are noted to be normal.  No mass, erythema, or lesions. The patient tolerated the procedure well.    Assessment: 1.  Bilateral cerumen impaction.  After the disimpaction procedure, both tympanic membranes and middle ear spaces are noted to be normal.   2.  Bilateral sensorineural hearing loss, secondary to presbycusis.  Plan: 1.  Otomicroscopy with bilateral cerumen disimpaction. 2.  The physical exam findings are reviewed with the patient. 3.  Continue the use of her hearing aids.

## 2023-04-03 DIAGNOSIS — M25551 Pain in right hip: Secondary | ICD-10-CM | POA: Diagnosis not present

## 2023-04-06 DIAGNOSIS — M25551 Pain in right hip: Secondary | ICD-10-CM | POA: Diagnosis not present

## 2023-04-13 DIAGNOSIS — Z933 Colostomy status: Secondary | ICD-10-CM | POA: Diagnosis not present

## 2023-05-26 ENCOUNTER — Ambulatory Visit (INDEPENDENT_AMBULATORY_CARE_PROVIDER_SITE_OTHER): Payer: Medicare Other | Admitting: Otolaryngology

## 2023-08-04 ENCOUNTER — Ambulatory Visit (INDEPENDENT_AMBULATORY_CARE_PROVIDER_SITE_OTHER): Admitting: Otolaryngology

## 2023-08-04 VITALS — HR 82

## 2023-08-04 DIAGNOSIS — H6123 Impacted cerumen, bilateral: Secondary | ICD-10-CM | POA: Diagnosis not present

## 2023-08-04 DIAGNOSIS — H903 Sensorineural hearing loss, bilateral: Secondary | ICD-10-CM

## 2023-08-06 NOTE — Progress Notes (Signed)
 Patient ID: Candace Cain, female   DOB: 1920/08/03, 89 y.o.   MRN: [id number]  Follow-up: Hearing loss  HPI: The patient is a 89 year old female who returns today for follow-up evaluation.  The patient has a history of bilateral sensorineural hearing loss.  She was fitted with bilateral hearing aids.  The patient returns today for her regular follow-up.  She complains of hearing difficulty, even when using the hearing aids.  She denies any recent change in her hearing.  Currently she denies any otalgia, otorrhea, or vertigo.  Exam: General: Communicates without difficulty, well nourished, no acute distress. Head: Normocephalic, no evidence injury, no tenderness, facial buttresses intact without stepoff. Face/sinus: No tenderness to palpation and percussion. Facial movement is normal and symmetric. Eyes: PERRL, EOMI. No scleral icterus, conjunctivae clear. Neuro: CN II exam reveals vision grossly intact.  No nystagmus at any point of gaze. Ears: Auricles well formed without lesions.  Bilateral cerumen impaction.  Nose: External evaluation reveals normal support and skin without lesions.  Dorsum is intact.  Anterior rhinoscopy reveals normal mucosa over anterior aspect of inferior turbinates and intact septum.  No purulence noted. Oral:  Oral cavity and oropharynx are intact, symmetric, without erythema or edema.  Mucosa is moist without lesions. Neck: Full range of motion without pain.  There is no significant lymphadenopathy.  No masses palpable.  Thyroid  bed within normal limits to palpation.  Parotid glands and submandibular glands equal bilaterally without mass.  Trachea is midline. Neuro:  CN 2-12 grossly intact.   Procedure: Bilateral cerumen disimpaction Anesthesia: None Description: Under the operating microscope, the cerumen is carefully removed with a combination of cerumen currette, alligator forceps, and suction catheters.  After the cerumen is removed, the TMs are noted to be normal.  No  mass, erythema, or lesions. The patient tolerated the procedure well.    Assessment: 1.  Bilateral sensorineural hearing loss, likely secondary to routine presbycusis. 2.  Bilateral recurrent cerumen impaction.  Her tympanic membranes and middle ear spaces are otherwise normal.  Plan: 1.  Otomicroscopy with bilateral cerumen disimpaction. 2.  The exam findings are reviewed with the patient. 3.  The patient should continue the use of her hearing aids.  Her hearing aids may need to be adjusted by the audiologist. 4.  The patient will return for reevaluation in 6 months, sooner if needed.

## 2023-10-09 DIAGNOSIS — R41841 Cognitive communication deficit: Secondary | ICD-10-CM | POA: Diagnosis not present

## 2023-10-09 DIAGNOSIS — R1312 Dysphagia, oropharyngeal phase: Secondary | ICD-10-CM | POA: Diagnosis not present

## 2023-10-09 DIAGNOSIS — R278 Other lack of coordination: Secondary | ICD-10-CM | POA: Diagnosis not present

## 2023-10-09 DIAGNOSIS — R2689 Other abnormalities of gait and mobility: Secondary | ICD-10-CM | POA: Diagnosis not present

## 2023-10-09 DIAGNOSIS — R471 Dysarthria and anarthria: Secondary | ICD-10-CM | POA: Diagnosis not present

## 2023-10-09 DIAGNOSIS — M6281 Muscle weakness (generalized): Secondary | ICD-10-CM | POA: Diagnosis not present

## 2023-10-10 DIAGNOSIS — R41841 Cognitive communication deficit: Secondary | ICD-10-CM | POA: Diagnosis not present

## 2023-10-10 DIAGNOSIS — I639 Cerebral infarction, unspecified: Secondary | ICD-10-CM | POA: Diagnosis not present

## 2023-10-10 DIAGNOSIS — R471 Dysarthria and anarthria: Secondary | ICD-10-CM | POA: Diagnosis not present

## 2023-10-10 DIAGNOSIS — R2689 Other abnormalities of gait and mobility: Secondary | ICD-10-CM | POA: Diagnosis not present

## 2023-10-10 DIAGNOSIS — R278 Other lack of coordination: Secondary | ICD-10-CM | POA: Diagnosis not present

## 2023-10-10 DIAGNOSIS — M6281 Muscle weakness (generalized): Secondary | ICD-10-CM | POA: Diagnosis not present

## 2023-10-10 DIAGNOSIS — R1312 Dysphagia, oropharyngeal phase: Secondary | ICD-10-CM | POA: Diagnosis not present

## 2023-10-11 DIAGNOSIS — R41841 Cognitive communication deficit: Secondary | ICD-10-CM | POA: Diagnosis not present

## 2023-10-11 DIAGNOSIS — R2689 Other abnormalities of gait and mobility: Secondary | ICD-10-CM | POA: Diagnosis not present

## 2023-10-11 DIAGNOSIS — R1312 Dysphagia, oropharyngeal phase: Secondary | ICD-10-CM | POA: Diagnosis not present

## 2023-10-11 DIAGNOSIS — R471 Dysarthria and anarthria: Secondary | ICD-10-CM | POA: Diagnosis not present

## 2023-10-11 DIAGNOSIS — M6281 Muscle weakness (generalized): Secondary | ICD-10-CM | POA: Diagnosis not present

## 2023-10-12 DIAGNOSIS — R2689 Other abnormalities of gait and mobility: Secondary | ICD-10-CM | POA: Diagnosis not present

## 2023-10-12 DIAGNOSIS — E43 Unspecified severe protein-calorie malnutrition: Secondary | ICD-10-CM | POA: Diagnosis not present

## 2023-10-12 DIAGNOSIS — E039 Hypothyroidism, unspecified: Secondary | ICD-10-CM | POA: Diagnosis not present

## 2023-10-12 DIAGNOSIS — R41841 Cognitive communication deficit: Secondary | ICD-10-CM | POA: Diagnosis not present

## 2023-10-12 DIAGNOSIS — D62 Acute posthemorrhagic anemia: Secondary | ICD-10-CM | POA: Diagnosis not present

## 2023-10-12 DIAGNOSIS — R1312 Dysphagia, oropharyngeal phase: Secondary | ICD-10-CM | POA: Diagnosis not present

## 2023-10-12 DIAGNOSIS — I10 Essential (primary) hypertension: Secondary | ICD-10-CM | POA: Diagnosis not present

## 2023-10-12 DIAGNOSIS — I639 Cerebral infarction, unspecified: Secondary | ICD-10-CM | POA: Diagnosis not present

## 2023-10-12 DIAGNOSIS — M6281 Muscle weakness (generalized): Secondary | ICD-10-CM | POA: Diagnosis not present

## 2023-10-12 DIAGNOSIS — R471 Dysarthria and anarthria: Secondary | ICD-10-CM | POA: Diagnosis not present

## 2023-10-25 DIAGNOSIS — K5900 Constipation, unspecified: Secondary | ICD-10-CM | POA: Diagnosis not present

## 2023-10-25 DIAGNOSIS — I10 Essential (primary) hypertension: Secondary | ICD-10-CM | POA: Diagnosis not present

## 2023-10-25 DIAGNOSIS — K649 Unspecified hemorrhoids: Secondary | ICD-10-CM | POA: Diagnosis not present

## 2023-11-27 DIAGNOSIS — R600 Localized edema: Secondary | ICD-10-CM | POA: Diagnosis not present

## 2023-11-27 DIAGNOSIS — I480 Paroxysmal atrial fibrillation: Secondary | ICD-10-CM | POA: Diagnosis not present

## 2023-11-27 DIAGNOSIS — K5792 Diverticulitis of intestine, part unspecified, without perforation or abscess without bleeding: Secondary | ICD-10-CM | POA: Diagnosis not present

## 2023-11-27 DIAGNOSIS — I10 Essential (primary) hypertension: Secondary | ICD-10-CM | POA: Diagnosis not present

## 2023-11-27 DIAGNOSIS — R1310 Dysphagia, unspecified: Secondary | ICD-10-CM | POA: Diagnosis not present

## 2023-11-27 DIAGNOSIS — H547 Unspecified visual loss: Secondary | ICD-10-CM | POA: Diagnosis not present

## 2023-12-14 ENCOUNTER — Telehealth (INDEPENDENT_AMBULATORY_CARE_PROVIDER_SITE_OTHER): Payer: Self-pay | Admitting: Otolaryngology

## 2023-12-14 NOTE — Telephone Encounter (Signed)
 The patient's niece called in ([email or web address]) to let us  know that the patient has had a stroke and is no longer able to use hearing aids.

## 2023-12-17 DIAGNOSIS — G8929 Other chronic pain: Secondary | ICD-10-CM | POA: Diagnosis not present

## 2023-12-17 DIAGNOSIS — F419 Anxiety disorder, unspecified: Secondary | ICD-10-CM | POA: Diagnosis not present

## 2023-12-17 DIAGNOSIS — I10 Essential (primary) hypertension: Secondary | ICD-10-CM | POA: Diagnosis not present

## 2024-02-09 ENCOUNTER — Ambulatory Visit (INDEPENDENT_AMBULATORY_CARE_PROVIDER_SITE_OTHER): Admitting: Otolaryngology
